# Patient Record
Sex: Male | Born: 1945 | Race: White | Hispanic: No | Marital: Married | Smoking: Former smoker
Health system: Southern US, Community
[De-identification: ages and names within clinical notes are randomized; demographics above are authoritative.]

## PROBLEM LIST (undated history)

## (undated) DIAGNOSIS — K635 Polyp of colon: Secondary | ICD-10-CM

## (undated) DIAGNOSIS — H269 Unspecified cataract: Secondary | ICD-10-CM

## (undated) DIAGNOSIS — E785 Hyperlipidemia, unspecified: Secondary | ICD-10-CM

## (undated) DIAGNOSIS — J449 Chronic obstructive pulmonary disease, unspecified: Secondary | ICD-10-CM

## (undated) DIAGNOSIS — Z5189 Encounter for other specified aftercare: Secondary | ICD-10-CM

## (undated) HISTORY — DX: Hyperlipidemia, unspecified: E78.5

## (undated) HISTORY — DX: Encounter for other specified aftercare: Z51.89

## (undated) HISTORY — DX: Unspecified cataract: H26.9

## (undated) HISTORY — PX: COLONOSCOPY: SHX174

## (undated) HISTORY — PX: TONSILLECTOMY: SUR1361

## (undated) HISTORY — PX: APPENDECTOMY: SHX54

## (undated) HISTORY — DX: Polyp of colon: K63.5

## (undated) HISTORY — DX: Chronic obstructive pulmonary disease, unspecified: J44.9

---

## 1972-10-05 HISTORY — PX: FRACTURE SURGERY: SHX138

## 2012-02-11 ENCOUNTER — Encounter: Payer: Self-pay | Admitting: Internal Medicine

## 2014-07-02 ENCOUNTER — Encounter: Payer: Self-pay | Admitting: Internal Medicine

## 2016-10-05 HISTORY — PX: LUMBAR SPINE SURGERY: SHX701

## 2016-10-05 HISTORY — PX: SPINE SURGERY: SHX786

## 2016-10-08 DIAGNOSIS — M48061 Spinal stenosis, lumbar region without neurogenic claudication: Secondary | ICD-10-CM | POA: Diagnosis not present

## 2016-10-08 DIAGNOSIS — M5136 Other intervertebral disc degeneration, lumbar region: Secondary | ICD-10-CM | POA: Diagnosis not present

## 2016-10-08 DIAGNOSIS — M4186 Other forms of scoliosis, lumbar region: Secondary | ICD-10-CM | POA: Diagnosis not present

## 2016-10-08 DIAGNOSIS — M9953 Intervertebral disc stenosis of neural canal of lumbar region: Secondary | ICD-10-CM | POA: Diagnosis not present

## 2016-10-13 DIAGNOSIS — M5136 Other intervertebral disc degeneration, lumbar region: Secondary | ICD-10-CM | POA: Diagnosis not present

## 2016-10-13 DIAGNOSIS — M5440 Lumbago with sciatica, unspecified side: Secondary | ICD-10-CM | POA: Diagnosis not present

## 2016-10-13 DIAGNOSIS — M4156 Other secondary scoliosis, lumbar region: Secondary | ICD-10-CM | POA: Diagnosis not present

## 2016-10-13 DIAGNOSIS — M5126 Other intervertebral disc displacement, lumbar region: Secondary | ICD-10-CM | POA: Diagnosis not present

## 2016-10-14 DIAGNOSIS — M5136 Other intervertebral disc degeneration, lumbar region: Secondary | ICD-10-CM | POA: Diagnosis not present

## 2016-10-14 DIAGNOSIS — Z720 Tobacco use: Secondary | ICD-10-CM | POA: Diagnosis not present

## 2016-10-14 DIAGNOSIS — I4510 Unspecified right bundle-branch block: Secondary | ICD-10-CM | POA: Diagnosis not present

## 2016-10-14 DIAGNOSIS — G4700 Insomnia, unspecified: Secondary | ICD-10-CM | POA: Diagnosis not present

## 2016-10-14 DIAGNOSIS — Z01818 Encounter for other preprocedural examination: Secondary | ICD-10-CM | POA: Diagnosis not present

## 2016-10-14 DIAGNOSIS — J449 Chronic obstructive pulmonary disease, unspecified: Secondary | ICD-10-CM | POA: Diagnosis not present

## 2016-10-19 DIAGNOSIS — L989 Disorder of the skin and subcutaneous tissue, unspecified: Secondary | ICD-10-CM | POA: Diagnosis not present

## 2016-10-20 DIAGNOSIS — E785 Hyperlipidemia, unspecified: Secondary | ICD-10-CM | POA: Diagnosis not present

## 2016-10-20 DIAGNOSIS — G4730 Sleep apnea, unspecified: Secondary | ICD-10-CM | POA: Diagnosis not present

## 2016-10-20 DIAGNOSIS — R6882 Decreased libido: Secondary | ICD-10-CM | POA: Diagnosis not present

## 2016-10-20 DIAGNOSIS — M5136 Other intervertebral disc degeneration, lumbar region: Secondary | ICD-10-CM | POA: Diagnosis not present

## 2016-10-20 DIAGNOSIS — F17210 Nicotine dependence, cigarettes, uncomplicated: Secondary | ICD-10-CM | POA: Diagnosis not present

## 2016-10-20 DIAGNOSIS — M4606 Spinal enthesopathy, lumbar region: Secondary | ICD-10-CM | POA: Diagnosis not present

## 2016-10-20 DIAGNOSIS — L989 Disorder of the skin and subcutaneous tissue, unspecified: Secondary | ICD-10-CM | POA: Diagnosis not present

## 2016-10-20 DIAGNOSIS — M4726 Other spondylosis with radiculopathy, lumbar region: Secondary | ICD-10-CM | POA: Diagnosis not present

## 2016-10-20 DIAGNOSIS — G4700 Insomnia, unspecified: Secondary | ICD-10-CM | POA: Diagnosis not present

## 2016-10-20 DIAGNOSIS — R5383 Other fatigue: Secondary | ICD-10-CM | POA: Diagnosis not present

## 2016-10-20 DIAGNOSIS — M48061 Spinal stenosis, lumbar region without neurogenic claudication: Secondary | ICD-10-CM | POA: Diagnosis not present

## 2016-10-20 DIAGNOSIS — R911 Solitary pulmonary nodule: Secondary | ICD-10-CM | POA: Diagnosis not present

## 2016-10-20 DIAGNOSIS — M2578 Osteophyte, vertebrae: Secondary | ICD-10-CM | POA: Diagnosis not present

## 2016-10-20 DIAGNOSIS — M9933 Osseous stenosis of neural canal of lumbar region: Secondary | ICD-10-CM | POA: Diagnosis not present

## 2016-10-20 DIAGNOSIS — M5116 Intervertebral disc disorders with radiculopathy, lumbar region: Secondary | ICD-10-CM | POA: Diagnosis not present

## 2016-10-20 DIAGNOSIS — M4186 Other forms of scoliosis, lumbar region: Secondary | ICD-10-CM | POA: Diagnosis not present

## 2016-10-20 DIAGNOSIS — I4519 Other right bundle-branch block: Secondary | ICD-10-CM | POA: Diagnosis not present

## 2016-10-20 DIAGNOSIS — J449 Chronic obstructive pulmonary disease, unspecified: Secondary | ICD-10-CM | POA: Diagnosis not present

## 2016-10-20 DIAGNOSIS — M48062 Spinal stenosis, lumbar region with neurogenic claudication: Secondary | ICD-10-CM | POA: Diagnosis not present

## 2016-10-20 DIAGNOSIS — Z86010 Personal history of colonic polyps: Secondary | ICD-10-CM | POA: Diagnosis not present

## 2016-10-20 DIAGNOSIS — M5117 Intervertebral disc disorders with radiculopathy, lumbosacral region: Secondary | ICD-10-CM | POA: Diagnosis not present

## 2016-10-21 DIAGNOSIS — M5116 Intervertebral disc disorders with radiculopathy, lumbar region: Secondary | ICD-10-CM | POA: Diagnosis not present

## 2016-10-21 DIAGNOSIS — M48061 Spinal stenosis, lumbar region without neurogenic claudication: Secondary | ICD-10-CM | POA: Diagnosis not present

## 2016-10-21 DIAGNOSIS — M4726 Other spondylosis with radiculopathy, lumbar region: Secondary | ICD-10-CM | POA: Diagnosis not present

## 2016-10-21 DIAGNOSIS — M4606 Spinal enthesopathy, lumbar region: Secondary | ICD-10-CM | POA: Diagnosis not present

## 2016-10-21 DIAGNOSIS — M4186 Other forms of scoliosis, lumbar region: Secondary | ICD-10-CM | POA: Diagnosis not present

## 2016-10-21 DIAGNOSIS — M5117 Intervertebral disc disorders with radiculopathy, lumbosacral region: Secondary | ICD-10-CM | POA: Diagnosis not present

## 2016-11-19 DIAGNOSIS — M545 Low back pain: Secondary | ICD-10-CM | POA: Diagnosis not present

## 2016-11-19 DIAGNOSIS — M5116 Intervertebral disc disorders with radiculopathy, lumbar region: Secondary | ICD-10-CM | POA: Diagnosis not present

## 2016-12-01 DIAGNOSIS — M5116 Intervertebral disc disorders with radiculopathy, lumbar region: Secondary | ICD-10-CM | POA: Diagnosis not present

## 2016-12-01 DIAGNOSIS — M545 Low back pain: Secondary | ICD-10-CM | POA: Diagnosis not present

## 2016-12-02 DIAGNOSIS — T8189XA Other complications of procedures, not elsewhere classified, initial encounter: Secondary | ICD-10-CM | POA: Diagnosis not present

## 2016-12-02 DIAGNOSIS — B009 Herpesviral infection, unspecified: Secondary | ICD-10-CM | POA: Diagnosis not present

## 2016-12-02 DIAGNOSIS — M5442 Lumbago with sciatica, left side: Secondary | ICD-10-CM | POA: Diagnosis not present

## 2016-12-02 DIAGNOSIS — G8929 Other chronic pain: Secondary | ICD-10-CM | POA: Diagnosis not present

## 2016-12-03 DIAGNOSIS — M5116 Intervertebral disc disorders with radiculopathy, lumbar region: Secondary | ICD-10-CM | POA: Diagnosis not present

## 2016-12-03 DIAGNOSIS — M545 Low back pain: Secondary | ICD-10-CM | POA: Diagnosis not present

## 2017-02-08 DIAGNOSIS — D2261 Melanocytic nevi of right upper limb, including shoulder: Secondary | ICD-10-CM | POA: Diagnosis not present

## 2017-02-08 DIAGNOSIS — L821 Other seborrheic keratosis: Secondary | ICD-10-CM | POA: Diagnosis not present

## 2017-02-08 DIAGNOSIS — L570 Actinic keratosis: Secondary | ICD-10-CM | POA: Diagnosis not present

## 2017-02-08 DIAGNOSIS — D225 Melanocytic nevi of trunk: Secondary | ICD-10-CM | POA: Diagnosis not present

## 2017-02-08 DIAGNOSIS — Z7189 Other specified counseling: Secondary | ICD-10-CM | POA: Diagnosis not present

## 2017-02-08 DIAGNOSIS — L853 Xerosis cutis: Secondary | ICD-10-CM | POA: Diagnosis not present

## 2017-02-19 DIAGNOSIS — Z1211 Encounter for screening for malignant neoplasm of colon: Secondary | ICD-10-CM | POA: Diagnosis not present

## 2017-02-19 DIAGNOSIS — D123 Benign neoplasm of transverse colon: Secondary | ICD-10-CM | POA: Diagnosis not present

## 2017-02-19 DIAGNOSIS — D122 Benign neoplasm of ascending colon: Secondary | ICD-10-CM | POA: Diagnosis not present

## 2017-02-19 DIAGNOSIS — Z86010 Personal history of colonic polyps: Secondary | ICD-10-CM | POA: Diagnosis not present

## 2017-02-19 DIAGNOSIS — D128 Benign neoplasm of rectum: Secondary | ICD-10-CM | POA: Diagnosis not present

## 2017-07-15 DIAGNOSIS — Z23 Encounter for immunization: Secondary | ICD-10-CM | POA: Diagnosis not present

## 2018-01-10 ENCOUNTER — Ambulatory Visit (INDEPENDENT_AMBULATORY_CARE_PROVIDER_SITE_OTHER): Payer: Medicare Other | Admitting: Family Medicine

## 2018-01-10 ENCOUNTER — Encounter: Payer: Self-pay | Admitting: Family Medicine

## 2018-01-10 VITALS — BP 122/78 | HR 72 | Ht 69.5 in | Wt 162.4 lb

## 2018-01-10 DIAGNOSIS — Z87891 Personal history of nicotine dependence: Secondary | ICD-10-CM | POA: Diagnosis not present

## 2018-01-10 DIAGNOSIS — G8929 Other chronic pain: Secondary | ICD-10-CM | POA: Diagnosis not present

## 2018-01-10 DIAGNOSIS — F10982 Alcohol use, unspecified with alcohol-induced sleep disorder: Secondary | ICD-10-CM | POA: Diagnosis not present

## 2018-01-10 DIAGNOSIS — R002 Palpitations: Secondary | ICD-10-CM

## 2018-01-10 DIAGNOSIS — M545 Low back pain, unspecified: Secondary | ICD-10-CM | POA: Insufficient documentation

## 2018-01-10 DIAGNOSIS — Z789 Other specified health status: Secondary | ICD-10-CM | POA: Diagnosis not present

## 2018-01-10 DIAGNOSIS — R200 Anesthesia of skin: Secondary | ICD-10-CM | POA: Insufficient documentation

## 2018-01-10 DIAGNOSIS — R0789 Other chest pain: Secondary | ICD-10-CM | POA: Insufficient documentation

## 2018-01-10 DIAGNOSIS — Z7289 Other problems related to lifestyle: Secondary | ICD-10-CM

## 2018-01-10 DIAGNOSIS — Z125 Encounter for screening for malignant neoplasm of prostate: Secondary | ICD-10-CM | POA: Diagnosis not present

## 2018-01-10 DIAGNOSIS — Z9889 Other specified postprocedural states: Secondary | ICD-10-CM | POA: Diagnosis not present

## 2018-01-10 DIAGNOSIS — Z122 Encounter for screening for malignant neoplasm of respiratory organs: Secondary | ICD-10-CM | POA: Insufficient documentation

## 2018-01-10 NOTE — Progress Notes (Addendum)
Subjective:  Patient ID: Kevin Shepard, male    DOB: 1946-07-20  Age: 72 y.o. MRN: [id number]  CC: Establish Care   HPI Kevin Shepard presents for establishment of care.  Recently moved to this area from [facility] area.  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  He is recently married.  His wife is from [place].  He presents with several medical issues.  He has ongoing intermittent numbness and tingling in his left first 3 fingers.  His right hand is affected but not to the extent extent of his left.  He spends a lot of time typing on the keyboard with his work.  He has lower back pain that sometimes brings him to his knees.  He had lower back surgery a year ago with the excision excision of multiple bone spurs and believes that a laminectomy was also involved with the surgery.  His back is done much better since the surgery but continues to bother him somewhat.  He denies numbness or tingling in his lower extremities.  There is been no weakness.  He does feel itching on the MTPs of his feet.  There is no rash.  He quit smoking last year.  He had smoked since his early 49s.  He tells me that he has had CTs of his chest.  He describes nighttime heavy palpitations with left anterior chest pain.  [facility]  He admits that he sometimes has the symptoms with exercise.  Chart also indicates a history of hyperlipidemia.  He has ongoing issues with insomnia.  He has been taking high-dose lunesta in the past that he purchases over-the-counter in [place].  He is also taking Unisom.  He does not use illicit drugs.  He drinks 3-4 bottles of wine weekly.  History Kevin Shepard has a past medical history of Hyperlipidemia.   He has a past surgical history that includes Appendectomy and Tonsillectomy.   His family history includes Cancer in his mother; Early death in his father; Heart disease in his father; Hyperlipidemia in  his sister.He reports that he has quit smoking. He has never used smokeless tobacco. He reports that he drinks alcohol. He reports that he does not use drugs.  Outpatient Medications Prior to Visit  Medication Sig Dispense Refill  . acyclovir (ZOVIRAX) 400 MG tablet Take 400 mg by mouth 2 (two) times daily.     No facility-administered medications prior to visit.     ROS Review of Systems  Constitutional: Positive for fatigue. Negative for chills, fever and unexpected weight change.  HENT: Negative.   Eyes: Negative.   Respiratory: Positive for shortness of breath. Negative for chest tightness and wheezing.   Cardiovascular: Positive for chest pain and palpitations.  Gastrointestinal: Negative.   Genitourinary: Negative.   Musculoskeletal: Positive for back pain. Negative for myalgias.  Skin: Negative for pallor and rash.  Allergic/Immunologic: Negative for immunocompromised state.  Neurological: Negative for weakness and numbness.  Hematological: Does not bruise/bleed easily.  Psychiatric/Behavioral: Negative.     Objective:  BP 122/78 (BP Location: Right Arm, Patient Position: Sitting, Cuff Size: Normal)   Pulse 72   Ht 5' 9.5" (1.765 m)   Wt 162 lb 6 oz (73.7 kg)   SpO2 95%   BMI 23.63 kg/m   Physical Exam  Constitutional: He appears well-developed and well-nourished. No distress.  HENT:  Head: Normocephalic and atraumatic.  Right Ear: External ear normal.  Left Ear: External ear normal.  Nose: Nose normal.  Mouth/Throat: Oropharynx is clear and moist. No oropharyngeal exudate.  Eyes: Pupils are equal, round, and reactive to light. Conjunctivae are normal. Right eye exhibits no discharge. Left eye exhibits no discharge. No scleral icterus.  Neck: Normal range of motion. Neck supple. No JVD present. No tracheal deviation present. No thyromegaly present.  Cardiovascular: Normal rate, regular rhythm and normal heart sounds.  Pulmonary/Chest: Effort normal and breath  sounds normal. No stridor. No respiratory distress. He has no wheezes. He has no rales.  Abdominal: Soft. Bowel sounds are normal. He exhibits distension. There is no tenderness. There is no guarding.  Musculoskeletal:       Lumbar back: He exhibits normal range of motion, no tenderness and no bony tenderness.  Lymphadenopathy:    He has no cervical adenopathy.  Neurological: He has normal strength.  Skin: Skin is warm and dry.      Assessment & Plan:   Shaw was seen today for establish care.  Diagnoses and all orders for this visit:  Other chest pain -     CBC; Future -     Comprehensive metabolic panel; Future -     Lipid panel; Future -     TSH; Future -     Urinalysis, Routine w reflex microscopic; Future -     Ambulatory referral to Cardiology  Palpitation -     TSH; Future -     Ambulatory referral to Cardiology  Numbness of fingers of both hands -     CBC; Future -     Ambulatory referral to Sports Medicine  Alcohol use  Alcohol-induced insomnia (HCC)  Chronic midline low back pain, with sciatica presence unspecified -     MR Lumbar Spine Wo Contrast; Future -     ALPRAZolam (XANAX) 0.5 MG tablet; Take one hour prior to procedure.  History of tobacco use  Screening for prostate cancer -     PSA; Future  History of back surgery -     Ambulatory referral to Cardiology   I am having Kevin Shepard "Kevin Shepard" start on ALPRAZolam. I am also having him maintain his acyclovir.  Meds ordered this encounter  Medications  . ALPRAZolam (XANAX) 0.5 MG tablet    Sig: Take one hour prior to procedure.    Dispense:  1 tablet    Refill:  0   Able to obtain an EKG  today because her machine is not working.  Patient certainly has risk factors for heart disease.  He will return fasting in the morning for blood work.  His symptoms warrant referral for a stress test.  I do suspect that this patient's insomnia, palpitations and his alcohol use could be anginal related  And I  told him so.  Told him of my concern about the safety of taking Lunesta with any alcohol at all.  He understood.  Follow-up: Return in about 1 month (around 02/07/2018).  Libby Maw, MD

## 2018-01-10 NOTE — Patient Instructions (Signed)
Back Pain, Adult Back pain is very common. The pain often gets better over time. The cause of back pain is usually not dangerous. Most people can learn to manage their back pain on their own. Follow these instructions at home: Watch your back pain for any changes. The following actions may help to lessen any pain you are feeling:  Stay active. Start with short walks on flat ground if you can. Try to walk farther each day.  Exercise regularly as told by your doctor. Exercise helps your back heal faster. It also helps avoid future injury by keeping your muscles strong and flexible.  Do not sit, drive, or stand in one place for more than 30 minutes.  Do not stay in bed. Resting more than 1-2 days can slow down your recovery.  Be careful when you bend or lift an object. Use good form when lifting: ? Bend at your knees. ? Keep the object close to your body. ? Do not twist.  Sleep on a firm mattress. Lie on your side, and bend your knees. If you lie on your back, put a pillow under your knees.  Take medicines only as told by your doctor.  Put ice on the injured area. ? Put ice in a plastic bag. ? Place a towel between your skin and the bag. ? Leave the ice on for 20 minutes, 2-3 times a day for the first 2-3 days. After that, you can switch between ice and heat packs.  Avoid feeling anxious or stressed. Find good ways to deal with stress, such as exercise.  Maintain a healthy weight. Extra weight puts stress on your back.  Contact a doctor if:  You have pain that does not go away with rest or medicine.  You have worsening pain that goes down into your legs or buttocks.  You have pain that does not get better in one week.  You have pain at night.  You lose weight.  You have a fever or chills. Get help right away if:  You cannot control when you poop (bowel movement) or pee (urinate).  Your arms or legs feel weak.  Your arms or legs lose feeling (numbness).  You feel sick  to your stomach (nauseous) or throw up (vomit).  You have belly (abdominal) pain.  You feel like you may pass out (faint). This information is not intended to replace advice given to you by your health care provider. Make sure you discuss any questions you have with your health care provider. Document Released: 03/09/2008 Document Revised: 02/27/2016 Document Reviewed: 01/23/2014 Elsevier Interactive Patient Education  2018 [employer].  Chronic Back Pain When back pain lasts longer than 3 months, it is called chronic back pain.The cause of your back pain may not be known. Some common causes include:  Wear and tear (degenerative disease) of the bones, ligaments, or disks in your back.  Inflammation and stiffness in your back (arthritis).  People who have chronic back pain often go through certain periods in which the pain is more intense (flare-ups). Many people can learn to manage the pain with home care. Follow these instructions at home: Pay attention to any changes in your symptoms. Take these actions to help with your pain: Activity  Avoid bending and activities that make the problem worse.  Do not sit or stand in one place for long periods of time.  Take brief periods of rest throughout the day. This will reduce your pain. Resting in a lying or standing  position is usually better than sitting to rest.  When you are resting for longer periods, mix in some mild activity or stretching between periods of rest. This will help to prevent stiffness and pain.  Get regular exercise. Ask your health care provider what activities are safe for you.  Do not lift anything that is heavier than 10 lb (4.5 kg). Always use proper lifting technique, which includes: ? Bending your knees. ? Keeping the load close to your body. ? Avoiding twisting. Managing pain  If directed, apply ice to the painful area. Your health care provider may recommend applying ice during the first 24-48 hours after  a flare-up begins. ? Put ice in a plastic bag. ? Place a towel between your skin and the bag. ? Leave the ice on for 20 minutes, 2-3 times per day.  After icing, apply heat to the affected area as often as told by your health care provider. Use the heat source that your health care provider recommends, such as a moist heat pack or a heating pad. ? Place a towel between your skin and the heat source. ? Leave the heat on for 20-30 minutes. ? Remove the heat if your skin turns bright red. This is especially important if you are unable to feel pain, heat, or cold. You may have a greater risk of getting burned.  Try soaking in a warm tub.  Take over-the-counter and prescription medicines only as told by your health care provider.  Keep all follow-up visits as told by your health care provider. This is important. Contact a health care provider if:  You have pain that is not relieved with rest or medicine. Get help right away if:  You have weakness or numbness in one or both of your legs or feet.  You have trouble controlling your bladder or your bowels.  You have nausea or vomiting.  You have pain in your abdomen.  You have shortness of breath or you faint. This information is not intended to replace advice given to you by your health care provider. Make sure you discuss any questions you have with your health care provider. Document Released: 10/29/2004 Document Revised: 01/30/2016 Document Reviewed: 03/11/2015 Elsevier Interactive Patient Education  2018 [employer].  Exercise Stress Electrocardiogram An exercise stress electrocardiogram is a test that is done to evaluate the blood supply to your heart. This test may also be called exercise stress electrocardiography. The test is done while you are walking on a treadmill. The goal of this test is to raise your heart rate. This test is done to find areas of poor blood flow to the heart by determining the extent of coronary artery  disease (CAD). CAD is defined as narrowing in one or more heart (coronary) arteries of more than 70%. If you have an abnormal test result, this may mean that you are not getting adequate blood flow to your heart during exercise. Additional testing may be needed to understand why your test was abnormal. Tell a health care provider about:  Any allergies you have.  All medicines you are taking, including vitamins, herbs, eye drops, creams, and over-the-counter medicines.  Any problems you or family members have had with anesthetic medicines.  Any blood disorders you have.  Any surgeries you have had.  Any medical conditions you have.  Possibility of pregnancy, if this applies. What are the risks? Generally, this is a safe procedure. However, as with any procedure, complications can occur. Possible complications can include:  Pain  or pressure in the following areas: ? Chest. ? Jaw or neck. ? Between your shoulder blades. ? Radiating down your left arm.  Dizziness or light-headedness.  Shortness of breath.  Increased or irregular heartbeats.  Nausea or vomiting.  Heart attack (rare).  What happens before the procedure?  Avoid all forms of caffeine 24 hours before your test or as directed by your health care provider. This includes coffee, tea (even decaffeinated tea), caffeinated sodas, chocolate, cocoa, and certain pain medicines.  Follow your health care provider's instructions regarding eating and drinking before the test.  Take your medicines as directed at regular times with water unless instructed otherwise. Exceptions may include: ? If you have diabetes, ask how you are to take your insulin or pills. It is common to adjust insulin dosing the morning of the test. ? If you are taking beta-blocker medicines, it is important to talk to your health care provider about these medicines well before the date of your test. Taking beta-blocker medicines may interfere with the test.  In some cases, these medicines need to be changed or stopped 24 hours or more before the test. ? If you wear a nitroglycerin patch, it may need to be removed prior to the test. Ask your health care provider if the patch should be removed before the test.  If you use an inhaler for any breathing condition, bring it with you to the test.  If you are an outpatient, bring a snack so you can eat right after the stress phase of the test.  Do not smoke for 4 hours prior to the test or as directed by your health care provider.  Do not apply lotions, powders, creams, or oils on your chest prior to the test.  Wear loose-fitting clothes and comfortable shoes for the test. This test involves walking on a treadmill. What happens during the procedure?  Multiple patches (electrodes) will be put on your chest. If needed, small areas of your chest may have to be shaved to get better contact with the electrodes. Once the electrodes are attached to your body, multiple wires will be attached to the electrodes and your heart rate will be monitored.  Your heart will be monitored both at rest and while exercising.  You will walk on a treadmill. The treadmill will be started at a slow pace. The treadmill speed and incline will gradually be increased to raise your heart rate. What happens after the procedure?  Your heart rate and blood pressure will be monitored after the test.  You may return to your normal schedule including diet, activities, and medicines, unless your health care provider tells you otherwise. This information is not intended to replace advice given to you by your health care provider. Make sure you discuss any questions you have with your health care provider. Document Released: 09/18/2000 Document Revised: 02/27/2016 Document Reviewed: 05/29/2013 Elsevier Interactive Patient Education  2017 [employer].  Palpitations A palpitation is the feeling that your heartbeat is irregular or is  faster than normal. It may feel like your heart is fluttering or skipping a beat. Palpitations are usually not a serious problem. They may be caused by many things, including smoking, caffeine, alcohol, stress, and certain medicines. Although most causes of palpitations are not serious, palpitations can be a sign of a serious medical problem. In some cases, you may need further medical evaluation. Follow these instructions at home: Pay attention to any changes in your symptoms. Take these actions to help with your  condition:  Avoid the following: ? Caffeinated coffee, tea, soft drinks, diet pills, and energy drinks. ? Chocolate. ? Alcohol.  Do not use any tobacco products, such as cigarettes, chewing tobacco, and e-cigarettes. If you need help quitting, ask your health care provider.  Try to reduce your stress and anxiety. Things that can help you relax include: ? Yoga. ? Meditation. ? Physical activity, such as swimming, jogging, or walking. ? Biofeedback. This is a method that helps you learn to use your mind to control things in your body, such as your heartbeats.  Get plenty of rest and sleep.  Take over-the-counter and prescription medicines only as told by your health care provider.  Keep all follow-up visits as told by your health care provider. This is important.  Contact a health care provider if:  You continue to have a fast or irregular heartbeat after 24 hours.  Your palpitations occur more often. Get help right away if:  You have chest pain or shortness of breath.  You have a severe headache.  You feel dizzy or you faint. This information is not intended to replace advice given to you by your health care provider. Make sure you discuss any questions you have with your health care provider. Document Released: 09/18/2000 Document Revised: 02/24/2016 Document Reviewed: 06/06/2015 Elsevier Interactive Patient Education  2018 [employer]. Insomnia Insomnia is a sleep  disorder that makes it difficult to fall asleep or to stay asleep. Insomnia can cause tiredness (fatigue), low energy, difficulty concentrating, mood swings, and poor performance at work or school. There are three different ways to classify insomnia:  Difficulty falling asleep.  Difficulty staying asleep.  Waking up too early in the morning.  Any type of insomnia can be long-term (chronic) or short-term (acute). Both are common. Short-term insomnia usually lasts for three months or less. Chronic insomnia occurs at least three times a week for longer than three months. What are the causes? Insomnia may be caused by another condition, situation, or substance, such as:  Anxiety.  Certain medicines.  Gastroesophageal reflux disease (GERD) or other gastrointestinal conditions.  Asthma or other breathing conditions.  Restless legs syndrome, sleep apnea, or other sleep disorders.  Chronic pain.  Menopause. This may include hot flashes.  Stroke.  Abuse of alcohol, tobacco, or illegal drugs.  Depression.  Caffeine.  Neurological disorders, such as Alzheimer disease.  An overactive thyroid (hyperthyroidism).  The cause of insomnia may not be known. What increases the risk? Risk factors for insomnia include:  Gender. Women are more commonly affected than men.  Age. Insomnia is more common as you get older.  Stress. This may involve your professional or personal life.  Income. Insomnia is more common in people with lower income.  Lack of exercise.  Irregular work schedule or night shifts.  Traveling between different time zones.  What are the signs or symptoms? If you have insomnia, trouble falling asleep or trouble staying asleep is the main symptom. This may lead to other symptoms, such as:  Feeling fatigued.  Feeling nervous about going to sleep.  Not feeling rested in the morning.  Having trouble concentrating.  Feeling irritable, anxious, or  depressed.  How is this treated? Treatment for insomnia depends on the cause. If your insomnia is caused by an underlying condition, treatment will focus on addressing the condition. Treatment may also include:  Medicines to help you sleep.  Counseling or therapy.  Lifestyle adjustments.  Follow these instructions at home:  Take medicines only as  directed by your health care provider.  Keep regular sleeping and waking hours. Avoid naps.  Keep a sleep diary to help you and your health care provider figure out what could be causing your insomnia. Include: ? When you sleep. ? When you wake up during the night. ? How well you sleep. ? How rested you feel the next day. ? Any side effects of medicines you are taking. ? What you eat and drink.  Make your bedroom a comfortable place where it is easy to fall asleep: ? Put up shades or special blackout curtains to block light from outside. ? Use a white noise machine to block noise. ? Keep the temperature cool.  Exercise regularly as directed by your health care provider. Avoid exercising right before bedtime.  Use relaxation techniques to manage stress. Ask your health care provider to suggest some techniques that may work well for you. These may include: ? Breathing exercises. ? Routines to release muscle tension. ? Visualizing peaceful scenes.  Cut back on alcohol, caffeinated beverages, and cigarettes, especially close to bedtime. These can disrupt your sleep.  Do not overeat or eat spicy foods right before bedtime. This can lead to digestive discomfort that can make it hard for you to sleep.  Limit screen use before bedtime. This includes: ? Watching TV. ? Using your smartphone, tablet, and computer.  Stick to a routine. This can help you fall asleep faster. Try to do a quiet activity, brush your teeth, and go to bed at the same time each night.  Get out of bed if you are still awake after 15 minutes of trying to sleep. Keep  the lights down, but try reading or doing a quiet activity. When you feel sleepy, go back to bed.  Make sure that you drive carefully. Avoid driving if you feel very sleepy.  Keep all follow-up appointments as directed by your health care provider. This is important. Contact a health care provider if:  You are tired throughout the day or have trouble in your daily routine due to sleepiness.  You continue to have sleep problems or your sleep problems get worse. Get help right away if:  You have serious thoughts about hurting yourself or someone else. This information is not intended to replace advice given to you by your health care provider. Make sure you discuss any questions you have with your health care provider. Document Released: 09/18/2000 Document Revised: 02/21/2016 Document Reviewed: 06/22/2014 Elsevier Interactive Patient Education  [employer].

## 2018-01-11 ENCOUNTER — Other Ambulatory Visit (INDEPENDENT_AMBULATORY_CARE_PROVIDER_SITE_OTHER): Payer: Medicare Other

## 2018-01-11 ENCOUNTER — Ambulatory Visit: Payer: PRIVATE HEALTH INSURANCE | Admitting: Family Medicine

## 2018-01-11 ENCOUNTER — Telehealth: Payer: Self-pay | Admitting: Family Medicine

## 2018-01-11 DIAGNOSIS — R002 Palpitations: Secondary | ICD-10-CM

## 2018-01-11 DIAGNOSIS — R200 Anesthesia of skin: Secondary | ICD-10-CM

## 2018-01-11 DIAGNOSIS — Z125 Encounter for screening for malignant neoplasm of prostate: Secondary | ICD-10-CM | POA: Diagnosis not present

## 2018-01-11 DIAGNOSIS — R0789 Other chest pain: Secondary | ICD-10-CM | POA: Diagnosis not present

## 2018-01-11 LAB — URINALYSIS, ROUTINE W REFLEX MICROSCOPIC
Bilirubin Urine: NEGATIVE
Hgb urine dipstick: NEGATIVE
Ketones, ur: NEGATIVE
Leukocytes, UA: NEGATIVE
Nitrite: NEGATIVE
RBC / HPF: NONE SEEN (ref 0–?)
Specific Gravity, Urine: 1.01 (ref 1.000–1.030)
Total Protein, Urine: NEGATIVE
Urine Glucose: NEGATIVE
Urobilinogen, UA: 0.2 (ref 0.0–1.0)
pH: 6.5 (ref 5.0–8.0)

## 2018-01-11 LAB — COMPREHENSIVE METABOLIC PANEL
ALT: 14 U/L (ref 0–53)
AST: 12 U/L (ref 0–37)
Albumin: 4.9 g/dL (ref 3.5–5.2)
Alkaline Phosphatase: 59 U/L (ref 39–117)
BUN: 16 mg/dL (ref 6–23)
CO2: 30 mEq/L (ref 19–32)
Calcium: 9.9 mg/dL (ref 8.4–10.5)
Chloride: 102 mEq/L (ref 96–112)
Creatinine, Ser: 0.79 mg/dL (ref 0.40–1.50)
GFR: 102.67 mL/min (ref >60.00)
Glucose, Bld: 85 mg/dL (ref 70–99)
Potassium: 4.1 mEq/L (ref 3.5–5.1)
Sodium: 142 mEq/L (ref 135–145)
Total Bilirubin: 1.2 mg/dL (ref 0.2–1.2)
Total Protein: 7.5 g/dL (ref 6.0–8.3)

## 2018-01-11 LAB — LIPID PANEL
Cholesterol: 180 mg/dL (ref 0–200)
HDL: 38 mg/dL — ABNORMAL LOW (ref >39.00)
LDL Cholesterol: 124 mg/dL — ABNORMAL HIGH (ref 0–99)
NonHDL: 141.66
Total CHOL/HDL Ratio: 5
Triglycerides: 88 mg/dL (ref 0.0–149.0)
VLDL: 17.6 mg/dL (ref 0.0–40.0)

## 2018-01-11 LAB — CBC
HCT: 44.6 % (ref 39.0–52.0)
Hemoglobin: 14.9 g/dL (ref 13.0–17.0)
MCHC: 33.5 g/dL (ref 30.0–36.0)
MCV: 84.4 fl (ref 78.0–100.0)
Platelets: 249 10*3/uL (ref 150.0–400.0)
RBC: 5.28 Mil/uL (ref 4.22–5.81)
RDW: 13.2 % (ref 11.5–15.5)
WBC: 5.4 10*3/uL (ref 4.0–10.5)

## 2018-01-11 LAB — PSA: PSA: 0.64 ng/mL (ref 0.10–4.00)

## 2018-01-11 LAB — TSH: TSH: 1.72 u[IU]/mL (ref 0.35–4.50)

## 2018-01-11 MED ORDER — ALPRAZOLAM 0.5 MG PO TABS
ORAL_TABLET | ORAL | 0 refills | Status: DC
Start: 2018-01-11 — End: 2018-01-13

## 2018-01-11 NOTE — Telephone Encounter (Signed)
I left a voicemail for patient to call the office back. I put directions in lab result note for triage to go over with patient if he returns the call.

## 2018-01-11 NOTE — Telephone Encounter (Signed)
Have sent an rx for a xanax to be taken one hour prior to the procedure.

## 2018-01-11 NOTE — Telephone Encounter (Signed)
Copied from [place] [id number]. Topic: Quick Communication - See Telephone Encounter >> Jan 11, 2018 11:30 AM Neva Seat wrote: Pt has an MRI - Monday 15th.  He is asking if he can get something prescribed for him to calm him during ht procedure. Please call pt back to discuss.

## 2018-01-11 NOTE — Addendum Note (Signed)
Addended by: Lynnea Ferrier on: 01/11/2018 09:32 AM   Modules accepted: Orders

## 2018-01-11 NOTE — Addendum Note (Signed)
Addended by: Abelino Derrick A on: 01/11/2018 12:02 PM   Modules accepted: Orders

## 2018-01-11 NOTE — Addendum Note (Signed)
Addended by: Lynnea Ferrier on: 01/11/2018 09:37 AM   Modules accepted: Orders

## 2018-01-12 ENCOUNTER — Encounter: Payer: Self-pay | Admitting: Family Medicine

## 2018-01-13 ENCOUNTER — Encounter: Payer: Self-pay | Admitting: Family Medicine

## 2018-01-13 ENCOUNTER — Ambulatory Visit: Payer: PRIVATE HEALTH INSURANCE | Admitting: Family Medicine

## 2018-01-13 ENCOUNTER — Other Ambulatory Visit (INDEPENDENT_AMBULATORY_CARE_PROVIDER_SITE_OTHER): Payer: Medicare Other

## 2018-01-13 ENCOUNTER — Ambulatory Visit (INDEPENDENT_AMBULATORY_CARE_PROVIDER_SITE_OTHER): Payer: Medicare Other | Admitting: Family Medicine

## 2018-01-13 VITALS — BP 128/64 | HR 62 | Temp 99.2°F | Ht 69.0 in | Wt 159.0 lb

## 2018-01-13 DIAGNOSIS — R200 Anesthesia of skin: Secondary | ICD-10-CM

## 2018-01-13 LAB — VITAMIN B12: Vitamin B-12: 586 pg/mL (ref 211–911)

## 2018-01-13 LAB — FOLATE: Folate: 17.2 ng/mL (ref >5.9)

## 2018-01-13 NOTE — Assessment & Plan Note (Signed)
Doesn't appear to be carpal tunnel. Possible for polyneuropathy. Normal Hgb and TSH. Could be associated with alcohol  - B12 and folate  - discussed nerve conduction test but he wished to refer at this point  - f/u PRN.

## 2018-01-13 NOTE — Progress Notes (Signed)
Kevin Shepard - 72 y.o. male MRN [id number]  Date of birth: 10/27/1945  SUBJECTIVE:  Including CC & ROS.  Chief Complaint  Patient presents with  . Bilateral hand numbness    Kevin Shepard is a 72 y.o. male that is presenting with bilateral hand numbness. Has been chronic in nature, ongoing for several years. Left hand is worse than right. He feels the numbness is intermittent. He has had one instance of not being able to feel the steering wheel. Denies injury. He feels the numbness in the tips of his fingers. Denies any radicular symptoms. No symptoms in his feet. Hasn't tried anything for this before. He denies the symptoms are worse in the mirror.    Review of Systems  Musculoskeletal: Negative for gait problem.  Skin: Negative for color change.  Allergic/Immunologic: Negative for immunocompromised state.  Neurological: Positive for numbness. Negative for weakness.  Hematological: Negative for adenopathy.  Psychiatric/Behavioral: Negative for agitation.    HISTORY: Past Medical, Surgical, Social, and Family History Reviewed & Updated per EMR.   Pertinent Historical Findings include:  Past Medical History:  Diagnosis Date  . Hyperlipidemia     Past Surgical History:  Procedure Laterality Date  . APPENDECTOMY    . LUMBAR SPINE SURGERY  2018  . TONSILLECTOMY      No Known Allergies  Family History  Problem Relation Age of Onset  . Cancer Mother   . Early death Father   . Heart disease Father   . Hyperlipidemia Sister      Social History   Socioeconomic History  . Marital status: Married    Spouse name: Not on file  . Number of children: Not on file  . Years of education: Not on file  . Highest education level: Not on file  Occupational History  . Not on file  Social Needs  . Financial resource strain: Not on file  . Food insecurity:    Worry: Not on file    Inability: Not on file  . Transportation needs:    Medical: Not on file    Non-medical: Not on file    Tobacco Use  . Smoking status: Former Research scientist (life sciences)  . Smokeless tobacco: Never Used  Substance and Sexual Activity  . Alcohol use: Yes  . Drug use: Never  . Sexual activity: Yes    Partners: Female  Lifestyle  . Physical activity:    Days per week: Not on file    Minutes per session: Not on file  . Stress: Not on file  Relationships  . Social connections:    Talks on phone: Not on file    Gets together: Not on file    Attends religious service: Not on file    Active member of club or organization: Not on file    Attends meetings of clubs or organizations: Not on file    Relationship status: Not on file  . Intimate partner violence:    Fear of current or ex partner: Not on file    Emotionally abused: Not on file    Physically abused: Not on file    Forced sexual activity: Not on file  Other Topics Concern  . Not on file  Social History Narrative  . Not on file     PHYSICAL EXAM:  VS: BP 128/64 (BP Location: Left Arm, Patient Position: Sitting, Cuff Size: Normal)   Pulse 62   Temp 99.2 F (37.3 C) (Oral)   Ht 5\' 9"  (1.753 m)   Wt 159  lb (72.1 kg)   SpO2 99%   BMI 23.48 kg/m  Physical Exam Gen: NAD, alert, cooperative with exam, well-appearing ENT: normal lips, normal nasal mucosa,  Eye: normal EOM, normal conjunctiva and lids CV:  no edema, +2 pedal pulses   Resp: no accessory muscle use, non-labored,  Skin: no rashes, no areas of induration  Neuro: normal tone, normal sensation to touch Psych:  normal insight, alert and oriented MSK:  Right and left hand:  No atrophy  Normal thumb opposition  Normal finger adduction and abduction strength to resistance  Normal wrist ROM  Normal elbow ROM  Negative Tinel's test  Neurovascularly intact      ASSESSMENT & PLAN:   Numbness of fingers of both hands Doesn't appear to be carpal tunnel. Possible for polyneuropathy. Normal Hgb and TSH. Could be associated with alcohol  - B12 and folate  - discussed nerve  conduction test but he wished to refer at this point  - f/u PRN.

## 2018-01-13 NOTE — Patient Instructions (Signed)
We will call you with the results from today  If this seems to be occurring more often then I would consider obtaining a nerve conduction study.

## 2018-01-17 ENCOUNTER — Ambulatory Visit
Admission: RE | Admit: 2018-01-17 | Discharge: 2018-01-17 | Disposition: A | Discharge status: Home / Self Care | Payer: Medicare Other | Source: Ambulatory Visit | Attending: Family Medicine | Admitting: Family Medicine

## 2018-01-17 DIAGNOSIS — M545 Low back pain: Principal | ICD-10-CM

## 2018-01-17 DIAGNOSIS — G8929 Other chronic pain: Secondary | ICD-10-CM

## 2018-01-17 DIAGNOSIS — M48061 Spinal stenosis, lumbar region without neurogenic claudication: Secondary | ICD-10-CM | POA: Diagnosis not present

## 2018-02-14 NOTE — Progress Notes (Signed)
Cardiology Office Note   Date:  02/17/2018   ID:  Kevin Shepard, DOB Apr 07, 1946, MRN [id number]  PCP:  Libby Maw, MD  Cardiologist:   Shea Swalley [place], MD   Chief Complaint  Patient presents with  . New Patient (Initial Visit)  . Chest Pain    sharp pain, occcasionally.  . Shortness of Breath    on exertion      History of Present Illness: Kevin Shepard is a 72 y.o. male who is seen at the request of Dr. Ethelene Hal for evaluation of palpitation and chest pain. He has a history of mild HLD. He recently moved from [place] and just wanted a check up. Notes he has a mild left parasternal pain infrequently over the past 2-3 years. It is sharp, localized, and only lasts a couple of minutes. No relation with activity. Also notes his heart seems to pound at night and he feels it in his ears. No skipping or racing. No dizziness.  He quit smoking in 2017/10/17. Father died of heart disease at age 62 but apparently this was related to Rheumatic heart disease. He is very active and exercises almost everyday. Works from home. Eats healthy.  Past Medical History:  Diagnosis Date  . Hyperlipidemia     Past Surgical History:  Procedure Laterality Date  . APPENDECTOMY    . LUMBAR SPINE SURGERY  2018  . TONSILLECTOMY       Current Outpatient Medications  Medication Sig Dispense Refill  . acyclovir (ZOVIRAX) 400 MG tablet Take 400 mg by mouth 2 (two) times daily.     No current facility-administered medications for this visit.     Allergies:   Patient has no known allergies.    Social History:  The patient  reports that he quit smoking about 4 months ago. He quit after 40.00 years of use. He has never used smokeless tobacco. He reports that he drinks alcohol. He reports that he does not use drugs.   Family History:  The patient's family history includes Cancer in his mother; Early death in his father; Heart disease (age of onset: 49) in his father; Hyperlipidemia in his  sister.    ROS:  Please see the history of present illness.   Otherwise, review of systems are positive for none.   All other systems are reviewed and negative.    PHYSICAL EXAM: VS:  BP 120/73   Pulse (!) 59   Ht 5\' 9"  (1.753 m)   Wt 163 lb 3.2 oz (74 kg)   BMI 24.10 kg/m  , BMI Body mass index is 24.1 kg/m. GEN: Well nourished, well developed, in no acute distress  HEENT: normal  Neck: no JVD, carotid bruits, or masses Cardiac: RRR; no murmurs, rubs, or gallops,no edema  Respiratory:  clear to auscultation bilaterally, normal work of breathing GI: soft, nontender, nondistended, + BS MS: no deformity or atrophy  Skin: warm and dry, no rash Neuro:  Strength and sensation are intact Psych: euthymic mood, full affect   EKG:  EKG is ordered today. The ekg ordered today demonstrates NSR with incomplete RBBB. I have personally reviewed and interpreted this study.    Recent Labs: 01/11/2018: ALT 14; BUN 16; Creatinine, Ser 0.79; Hemoglobin 14.9; Platelets 249.0; Potassium 4.1; Sodium 142; TSH 1.72    Lipid Panel    Component Value Date/Time   CHOL 180 01/11/2018 0947   TRIG 88.0 01/11/2018 0947   HDL 38.00 (L) 01/11/2018 0947   CHOLHDL 5  01/11/2018 0947   VLDL 17.6 01/11/2018 0947   LDLCALC 124 (H) 01/11/2018 0947      Wt Readings from Last 3 Encounters:  02/17/18 163 lb 3.2 oz (74 kg)  01/13/18 159 lb (72.1 kg)  01/10/18 162 lb 6 oz (73.7 kg)      Other studies Reviewed: Additional studies/ records that were reviewed today include: none. Review of the above records demonstrates: N/A   ASSESSMENT AND PLAN:  1.  Atypical chest pain. Symptoms are not consistent with angina. Overall risk is low. Ecg is benign and eam is normal. No further testing recommended.  2. Heart pounding. Increased cardiac awareness without real palpitations. Reassured.  3. Mild HLD. Continue lifestyle modification 4. History of tobacco use. Now stopped.    Current medicines are reviewed  at length with the patient today.  The patient does not have concerns regarding medicines.  The following changes have been made:  no change  Labs/ tests ordered today include: none No orders of the defined types were placed in this encounter.    Disposition:   FU with me PRN Signed, Daisy Lites [place], MD  02/17/2018 9:02 AM    [facility] [address] [place], [place], [address] Phone [phone], Fax [phone]

## 2018-02-17 ENCOUNTER — Ambulatory Visit (INDEPENDENT_AMBULATORY_CARE_PROVIDER_SITE_OTHER): Payer: Medicare Other | Admitting: Cardiology

## 2018-02-17 ENCOUNTER — Encounter: Payer: Self-pay | Admitting: Cardiology

## 2018-02-17 VITALS — BP 120/73 | HR 59 | Ht 69.0 in | Wt 163.2 lb

## 2018-02-17 DIAGNOSIS — R002 Palpitations: Secondary | ICD-10-CM | POA: Diagnosis not present

## 2018-02-17 DIAGNOSIS — R0789 Other chest pain: Secondary | ICD-10-CM | POA: Diagnosis not present

## 2018-04-06 ENCOUNTER — Encounter: Payer: Self-pay | Admitting: Emergency Medicine

## 2018-04-06 ENCOUNTER — Ambulatory Visit (INDEPENDENT_AMBULATORY_CARE_PROVIDER_SITE_OTHER): Payer: Medicare Other | Admitting: Emergency Medicine

## 2018-04-06 ENCOUNTER — Other Ambulatory Visit: Payer: Self-pay

## 2018-04-06 VITALS — BP 110/60 | HR 64 | Temp 98.7°F | Resp 16 | Ht 68.5 in | Wt 160.8 lb

## 2018-04-06 DIAGNOSIS — G8929 Other chronic pain: Secondary | ICD-10-CM

## 2018-04-06 DIAGNOSIS — M545 Low back pain, unspecified: Secondary | ICD-10-CM

## 2018-04-06 DIAGNOSIS — K625 Hemorrhage of anus and rectum: Secondary | ICD-10-CM | POA: Diagnosis not present

## 2018-04-06 DIAGNOSIS — Z122 Encounter for screening for malignant neoplasm of respiratory organs: Secondary | ICD-10-CM

## 2018-04-06 DIAGNOSIS — Z87891 Personal history of nicotine dependence: Secondary | ICD-10-CM | POA: Diagnosis not present

## 2018-04-06 LAB — HEMOCCULT GUIAC POC 1CARD (OFFICE): Fecal Occult Blood, POC: POSITIVE — AB

## 2018-04-06 NOTE — Progress Notes (Signed)
Kevin Shepard 72 y.o.   Chief Complaint  Patient presents with  . Establish Care  . Rectal Bleeding    04/05/2018 - per patient on the tissue after wiping    HISTORY OF PRESENT ILLNESS: This is a 72 y.o. male complaining of small amount of rectal bleeding yesterday.  Noticed blood on the tissue paper.  Had some rectal itching.  Denies abdominal pain.  Able to eat and drink.  Denies nausea or vomiting.  Denies fever or chills.  Had colonoscopy done last year with findings of polyps.  Follow-up in 5 years. #2 has history of chronic low back pain.  Recent MRI shows postop changes and some degree of spinal stenosis. #3 former smoker last CT of the chest to 3 years ago.  Has some intermittent fatigue and dyspnea with lightheadedness.  HPI   Prior to Admission medications   Medication Sig Start Date End Date Taking? Authorizing Provider  acyclovir (ZOVIRAX) 400 MG tablet Take 400 mg by mouth 2 (two) times daily.   Yes [provider]    No Known Allergies  Patient Active Problem List   Diagnosis Date Noted  . Other chest pain 01/10/2018  . Palpitation 01/10/2018  . Numbness of fingers of both hands 01/10/2018  . Alcohol use 01/10/2018  . 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 Chronic midline low back pain 01/10/2018  . History of tobacco use 01/10/2018  . Screening for prostate cancer 01/10/2018  . History of back surgery 01/10/2018    Past Medical History:  Diagnosis Date  . COPD (chronic obstructive pulmonary disease) ([place])   . Hyperlipidemia     Past Surgical History:  Procedure Laterality Date  . APPENDECTOMY     per patient 1950's  . CESAREAN SECTION  03/2017  . FRACTURE SURGERY  1974   right clavicle  . LUMBAR SPINE SURGERY  2018  . SPINE SURGERY  10/2016  . TONSILLECTOMY      Social History   Socioeconomic History  . Marital status: Married    Spouse name: Not on file  . Number of children: Not on file  . Years of education: Not on file  .  Highest education level: Not on file  Occupational History  . Occupation: Merchandiser, retail trading  Social Needs  . Financial resource strain: Not on file  . Food insecurity:    Worry: Not on file    Inability: Not on file  . Transportation needs:    Medical: Not on file    Non-medical: Not on file  Tobacco Use  . Smoking status: Former Smoker    Years: 40.00    Last attempt to quit: 10/04/2017    Years since quitting: 0.5  . Smokeless tobacco: Never Used  Substance and Sexual Activity  . Alcohol use: Yes  . Drug use: Never  . Sexual activity: Yes    Partners: Female  Lifestyle  . Physical activity:    Days per week: Not on file    Minutes per session: Not on file  . Stress: Not on file  Relationships  . Social connections:    Talks on phone: Not on file    Gets together: Not on file    Attends religious service: Not on file    Active member of club or organization: Not on file    Attends meetings of clubs or organizations: Not on file    Relationship status: Not on file  . Intimate partner violence:    Fear of current  or ex partner: Not on file    Emotionally abused: Not on file    Physically abused: Not on file    Forced sexual activity: Not on file  Other Topics Concern  . Not on file  Social History Narrative  . Not on file    Family History  Problem Relation Age of Onset  . Cancer Mother   . Early death Father   . Heart disease Father 44       rheumatic heart disease  . Hyperlipidemia Sister      Review of Systems  Constitutional: Positive for malaise/fatigue. Negative for chills and fever.  HENT: Negative.  Negative for sore throat.   Eyes: Negative.  Negative for blurred vision and double vision.  Respiratory: Positive for shortness of breath (Mostly on exertion). Negative for cough, wheezing and stridor.   Cardiovascular: Negative.  Negative for chest pain, palpitations and leg swelling.  Gastrointestinal: Positive for blood in stool and constipation.  Negative for abdominal pain, diarrhea, melena, nausea and vomiting.  Genitourinary: Negative.  Negative for dysuria.  Musculoskeletal: Negative.  Negative for back pain, myalgias and neck pain.  Skin: Negative for rash.  Neurological: Negative.  Negative for dizziness and headaches.  Endo/Heme/Allergies: Negative.  Does not bruise/bleed easily.  All other systems reviewed and are negative.   Vitals:   04/06/18 1401  BP: 110/60  Pulse: 64  Resp: 16  Temp: 98.7 F (37.1 C)  SpO2: 97%    Physical Exam  Constitutional: He is oriented to person, place, and time. He appears well-developed and well-nourished.  HENT:  Head: Normocephalic and atraumatic.  Nose: Nose normal.  Mouth/Throat: Oropharynx is clear and moist.  Eyes: Pupils are equal, round, and reactive to light. Conjunctivae and EOM are normal.  Neck: Normal range of motion. Neck supple. No JVD present. No thyromegaly present.  Cardiovascular: Normal rate, regular rhythm and normal heart sounds.  Pulmonary/Chest: Effort normal and breath sounds normal.  Abdominal: Soft. Bowel sounds are normal. He exhibits no distension. There is no tenderness.  Genitourinary: Rectal exam shows guaiac positive stool. Rectal exam shows no external hemorrhoid, no fissure, no mass, no tenderness and anal tone normal. Prostate is not tender.  Musculoskeletal: Normal range of motion. He exhibits no edema or tenderness.  Lymphadenopathy:    He has no cervical adenopathy.  Neurological: He is alert and oriented to person, place, and time. No sensory deficit. He exhibits normal muscle tone.  Skin: Skin is warm and dry. Capillary refill takes less than 2 seconds. No rash noted.  Psychiatric: He has a normal mood and affect. His behavior is normal.  Vitals reviewed.    ASSESSMENT & PLAN: Kevin Shepard was seen today for establish care and rectal bleeding.  Diagnoses and all orders for this visit:  Rectal bleeding -     CBC with  Differential/Platelet -     Basic metabolic panel -     Hemoccult - 1 Card (office)  Chronic bilateral low back pain without sciatica -     Ambulatory referral to Orthopedic Surgery  Encounter for screening for malignant neoplasm of respiratory organs -     CT CHEST LUNG CA SCREEN LOW DOSE W/O CM; Future  Former smoker -     CT CHEST LUNG CA SCREEN LOW DOSE W/O CM; Future    Patient Instructions       IF you received an x-ray today, you will receive an invoice from [facility]. Please contact [facility] at [phone] with  questions or concerns regarding your invoice.   IF you received labwork today, you will receive an invoice from [facility]. Please contact [facility] at [phone] with questions or concerns regarding your invoice.   Our billing staff will not be able to assist you with questions regarding bills from these companies.  You will be contacted with the lab results as soon as they are available. The fastest way to get your results is to activate your My Chart account. Instructions are located on the last page of this paperwork. If you have not heard from [place] regarding the results in 2 weeks, please contact this office.     Rectal Bleeding Rectal bleeding is when blood comes out of the opening of the butt (anus). People with this kind of bleeding may notice bright red blood in their underwear or in the toilet after they poop (have a bowel movement). They may also have dark red or black poop (stool). Rectal bleeding is often a sign that something is wrong. It needs to be checked by a doctor. Follow these instructions at home: Watch for any changes in your condition. Take these actions to help with bleeding and discomfort:  Eat a diet that is high in fiber. This will keep your poop soft so it is easier for you to poop without pushing too hard. Ask your doctor to tell you what foods and drinks are high in fiber.  Drink enough fluid to keep your pee  (urine) clear or pale yellow. This also helps keep your poop soft.  Try taking a warm bath. This may help with pain.  Keep all follow-up visits as told by your doctor. This is important.  Get help right away if:  You have new bleeding.  You have more bleeding than before.  You have black or dark red poop.  You throw up (vomit) blood or something that looks like coffee grounds.  You have pain or tenderness in your belly (abdomen).  You have a fever.  You feel weak.  You feel sick to your stomach (nauseous).  You pass out (faint).  You have very bad pain in your butt.  You cannot poop. This information is not intended to replace advice given to you by your health care provider. Make sure you discuss any questions you have with your health care provider. Document Released: 06/03/2011 Document Revised: 02/27/2016 Document Reviewed: 11/17/2015 Elsevier Interactive Patient Education  2018 [employer].      Agustina Caroli, MD [facility]

## 2018-04-06 NOTE — Patient Instructions (Addendum)
     IF you received an x-ray today, you will receive an invoice from [facility]. Please contact [facility] at [phone] with questions or concerns regarding your invoice.   IF you received labwork today, you will receive an invoice from [facility]. Please contact [facility] at [phone] with questions or concerns regarding your invoice.   Our billing staff will not be able to assist you with questions regarding bills from these companies.  You will be contacted with the lab results as soon as they are available. The fastest way to get your results is to activate your My Chart account. Instructions are located on the last page of this paperwork. If you have not heard from [place] regarding the results in 2 weeks, please contact this office.     Rectal Bleeding Rectal bleeding is when blood comes out of the opening of the butt (anus). People with this kind of bleeding may notice bright red blood in their underwear or in the toilet after they poop (have a bowel movement). They may also have dark red or black poop (stool). Rectal bleeding is often a sign that something is wrong. It needs to be checked by a doctor. Follow these instructions at home: Watch for any changes in your condition. Take these actions to help with bleeding and discomfort:  Eat a diet that is high in fiber. This will keep your poop soft so it is easier for you to poop without pushing too hard. Ask your doctor to tell you what foods and drinks are high in fiber.  Drink enough fluid to keep your pee (urine) clear or pale yellow. This also helps keep your poop soft.  Try taking a warm bath. This may help with pain.  Keep all follow-up visits as told by your doctor. This is important.  Get help right away if:  You have new bleeding.  You have more bleeding than before.  You have black or dark red poop.  You throw up (vomit) blood or something that looks like coffee grounds.  You have pain or  tenderness in your belly (abdomen).  You have a fever.  You feel weak.  You feel sick to your stomach (nauseous).  You pass out (faint).  You have very bad pain in your butt.  You cannot poop. This information is not intended to replace advice given to you by your health care provider. Make sure you discuss any questions you have with your health care provider. Document Released: 06/03/2011 Document Revised: 02/27/2016 Document Reviewed: 11/17/2015 Elsevier Interactive Patient Education  [employer].

## 2018-04-07 ENCOUNTER — Encounter: Payer: Self-pay | Admitting: Emergency Medicine

## 2018-04-07 LAB — CBC WITH DIFFERENTIAL/PLATELET
Basophils Absolute: 0 10*3/uL (ref 0.0–0.2)
Basos: 0 %
EOS (ABSOLUTE): 0.3 10*3/uL (ref 0.0–0.4)
Eos: 5 %
Hematocrit: 40.4 % (ref 37.5–51.0)
Hemoglobin: 13 g/dL (ref 13.0–17.7)
Immature Grans (Abs): 0 10*3/uL (ref 0.0–0.1)
Immature Granulocytes: 0 %
Lymphocytes Absolute: 2 10*3/uL (ref 0.7–3.1)
Lymphs: 31 %
MCH: 30.6 pg (ref 26.6–33.0)
MCHC: 32.2 g/dL (ref 31.5–35.7)
MCV: 95 fL (ref 79–97)
Monocytes Absolute: 0.6 10*3/uL (ref 0.1–0.9)
Monocytes: 9 %
Neutrophils Absolute: 3.5 10*3/uL (ref 1.4–7.0)
Neutrophils: 55 %
Platelets: 252 10*3/uL (ref 150–450)
RBC: 4.25 x10E6/uL (ref 4.14–5.80)
RDW: 14 % (ref 12.3–15.4)
WBC: 6.4 10*3/uL (ref 3.4–10.8)

## 2018-04-07 LAB — BASIC METABOLIC PANEL
BUN/Creatinine Ratio: 17 (ref 10–24)
BUN: 16 mg/dL (ref 8–27)
CO2: 24 mmol/L (ref 20–29)
Calcium: 9 mg/dL (ref 8.6–10.2)
Chloride: 105 mmol/L (ref 96–106)
Creatinine, Ser: 0.94 mg/dL (ref 0.76–1.27)
GFR calc Af Amer: 94 mL/min/{1.73_m2} (ref >59)
GFR calc non Af Amer: 81 mL/min/{1.73_m2} (ref >59)
Glucose: 101 mg/dL — ABNORMAL HIGH (ref 65–99)
Potassium: 4.3 mmol/L (ref 3.5–5.2)
Sodium: 142 mmol/L (ref 134–144)

## 2018-05-12 ENCOUNTER — Ambulatory Visit (HOSPITAL_COMMUNITY)
Admission: RE | Admit: 2018-05-12 | Discharge: 2018-05-12 | Disposition: A | Discharge status: Home / Self Care | Payer: Medicare Other | Source: Ambulatory Visit | Attending: Emergency Medicine | Admitting: Emergency Medicine

## 2018-05-12 DIAGNOSIS — Z122 Encounter for screening for malignant neoplasm of respiratory organs: Secondary | ICD-10-CM | POA: Diagnosis not present

## 2018-05-12 DIAGNOSIS — I700 Atherosclerosis of aorta: Secondary | ICD-10-CM | POA: Diagnosis not present

## 2018-05-12 DIAGNOSIS — J439 Emphysema, unspecified: Secondary | ICD-10-CM | POA: Diagnosis not present

## 2018-05-12 DIAGNOSIS — Z87891 Personal history of nicotine dependence: Secondary | ICD-10-CM | POA: Diagnosis not present

## 2018-05-12 DIAGNOSIS — I2510 Atherosclerotic heart disease of native coronary artery without angina pectoris: Secondary | ICD-10-CM | POA: Insufficient documentation

## 2018-05-13 DIAGNOSIS — M4696 Unspecified inflammatory spondylopathy, lumbar region: Secondary | ICD-10-CM | POA: Diagnosis not present

## 2018-05-14 ENCOUNTER — Other Ambulatory Visit: Payer: Self-pay | Admitting: Emergency Medicine

## 2018-05-14 ENCOUNTER — Encounter: Payer: Self-pay | Admitting: Radiology

## 2018-05-14 DIAGNOSIS — R911 Solitary pulmonary nodule: Secondary | ICD-10-CM

## 2018-05-16 DIAGNOSIS — M47816 Spondylosis without myelopathy or radiculopathy, lumbar region: Secondary | ICD-10-CM | POA: Diagnosis not present

## 2018-06-02 ENCOUNTER — Encounter: Payer: Self-pay | Admitting: Internal Medicine

## 2018-06-02 ENCOUNTER — Ambulatory Visit (INDEPENDENT_AMBULATORY_CARE_PROVIDER_SITE_OTHER): Payer: Medicare Other | Admitting: Internal Medicine

## 2018-06-02 VITALS — BP 124/68 | HR 70 | Ht 68.0 in | Wt 163.4 lb

## 2018-06-02 DIAGNOSIS — R911 Solitary pulmonary nodule: Secondary | ICD-10-CM

## 2018-06-02 DIAGNOSIS — J449 Chronic obstructive pulmonary disease, unspecified: Secondary | ICD-10-CM | POA: Diagnosis not present

## 2018-06-02 DIAGNOSIS — R918 Other nonspecific abnormal finding of lung field: Secondary | ICD-10-CM | POA: Insufficient documentation

## 2018-06-02 NOTE — Assessment & Plan Note (Addendum)
Quit smoking 09/2017  - CT 05/12/18 Mild changes of emphysema identified.  - Spirometry 06/02/2018  FEV1 1.90 (64%)  Ratio 66 with mild curvature  Off all rx      I reviewed the Fletcher curve with the patient that basically indicates  if you quit smoking when your best day FEV1 is still well preserved (as is still   the case here)  it is highly unlikely you will progress to severe disease and informed the patient there was  no medication on the market that has proven to alter the curve/ its downward trajectory  or the likelihood of progression of their disease(unlike other chronic medical conditions such as atheroclerosis where we do think we can change the natural hx with risk reducing meds)    Therefore stopping smoking and maintaining abstinence are  the most important aspects of care, not choice of inhalers or for that matter, doctors.   Treatment other than smoking cessation  is entirely directed by severity of symptoms and focused also on reducing exacerbations, not attempting to change the natural history of the disease.  Since he has no symptoms or flare tendency he is group A and no need for rx x prn saba for flares or if really gets more limited by breathing from desired activities: LAMA = spiriva or tudorza or Incruse, depending on device preference and insurance.

## 2018-06-02 NOTE — Progress Notes (Signed)
Kevin Shepard, male    DOB: 31-Oct-1945,   MRN: [id number]   Brief patient profile:  62 yowm quit smoking 09/2017 with doe x around 2013 while in [place] could not hike any more like he was used to and pulmonary eval >  ? Copd severity > advair no better and breathing some worse and some better since Arrived in [place] July 2018 with h/o MPN's so referred to pulmonary clinic 06/02/2018 by Dr   Mitchel Honour     06/02/2018  1st pulmonary ov/ Cassi Jenne  Chief Complaint  Patient presents with  . Pulmonary Consult    Referred by Dr Mitchel Honour for eval of pulmonary nodule. Pt c/o DOE off and on for the past several years. He gets winded walking up an incline.   Dyspnea:  MMRC1 = can walk nl pace, flat grade, can't hurry or go uphills or steps s sob  / no variability Cough: none now/ some while smoking Sleep: on either side usually L side down / bed is flat SABA use: none    No obvious day to day or daytime variability or assoc excess/ purulent sputum or mucus plugs or hemoptysis or cp or chest tightness, subjective wheeze or overt sinus or hb symptoms.   Sleep as above without nocturnal  or early am exacerbation  of respiratory  c/o's or need for noct saba. Also denies any obvious fluctuation of symptoms with weather or environmental changes or other aggravating or alleviating factors except as outlined above.   No unusual exposure hx or h/o childhood pna/ asthma or knowledge of premature birth.  Current Allergies, Complete Past Medical History, Past Surgical History, Family History, and Social History were reviewed in [employer] record.  ROS  The following are not active complaints unless bolded Hoarseness, sore throat, dysphagia, dental problems- upper and lower implants , itching, sneezing,  nasal congestion or discharge of excess mucus or purulent secretions, ear ache,   fever, chills, sweats, unintended wt loss or wt gain, classically pleuritic or exertional cp,  orthopnea pnd or  arm/hand swelling  > leg swelling x sev years, presyncope, palpitations, abdominal pain, anorexia, nausea, vomiting, diarrhea  or change in bowel habits or change in bladder habits, change in stools or change in urine, dysuria, hematuria,  rash, arthralgias, visual complaints, headache, numbness, weakness or ataxia or problems with walking or coordination,  change in mood or  memory.           Past Medical History:  Diagnosis Date  . COPD (chronic obstructive pulmonary disease) ([place])   . Hyperlipidemia     Outpatient Medications Prior to Visit  Medication Sig Dispense Refill  . acyclovir (ZOVIRAX) 400 MG tablet Take 400 mg by mouth 2 (two) times daily.     No facility-administered medications prior to visit.              Objective:     BP 124/68 (BP Location: Left Arm, Cuff Size: Normal)   Pulse 70   Ht 5\' 8"  (1.727 m)   Wt 163 lb 6.4 oz (74.1 kg)   SpO2 96%   BMI 24.84 kg/m   SpO2: 96 %  RA    amb wm nad    HEENT: nl dentition, turbinates bilaterally, and oropharynx. Nl external ear canals without cough reflex   NECK :  without JVD/Nodes/TM/ nl carotid upstrokes bilaterally   LUNGS: no acc muscle use,  Nl contour chest which is clear to A and P bilaterally without cough on  insp or exp maneuvers   CV:  RRR  no s3 or murmur or increase in P2, and no edema   ABD:  soft and nontender with nl inspiratory excursion in the supine position. No bruits or organomegaly appreciated, bowel sounds nl  MS:  Nl gait/ ext warm without deformities, calf tenderness, cyanosis or clubbing No obvious joint restrictions   SKIN: warm and dry without lesions    NEURO:  alert, approp, nl sensorium with  no motor or cerebellar deficits apparent.           Assessment   COPD  GOLD II/ Group A Quit smoking 09/2017  - CT 05/12/18 Mild changes of emphysema identified.  - Spirometry 06/02/2018  FEV1 1.90 (64%)  Ratio 66 with mild curvature  Off all rx      I reviewed the  Fletcher curve with the patient that basically indicates  if you quit smoking when your best day FEV1 is still well preserved (as is still   the case here)  it is highly unlikely you will progress to severe disease and informed the patient there was  no medication on the market that has proven to alter the curve/ its downward trajectory  or the likelihood of progression of their disease(unlike other chronic medical conditions such as atheroclerosis where we do think we can change the natural hx with risk reducing meds)    Therefore stopping smoking and maintaining abstinence are  the most important aspects of care, not choice of inhalers or for that matter, doctors.   Treatment other than smoking cessation  is entirely directed by severity of symptoms and focused also on reducing exacerbations, not attempting to change the natural history of the disease.  Since he has no symptoms or flare tendency he is group A and no need for rx x prn saba for flares or if really gets more limited by breathing from desired activities: LAMA = spiriva or tudorza or Incruse, depending on device preference and insurance.   Left upper lobe pulmonary nodule CT  02/11/12  x  7 mm CT  05/12/18 = 9.1 mm  rec repeat in 12 months (placed in reminder file but defer to PCP to arrange)    CT results reviewed with pt >>> Too small for PET or bx, not suspicious enough for excisional bx > really only option for now is follow the Fleischner society guidelines as rec by radiology> in this case since it only grew 2 mm in 6 years reasonable to repeat in a year as part of his LDSCT since quit smoking 8 m ago  Discussed in detail all the  indications, usual  risks and alternatives  relative to the benefits with patient who agrees to proceed with conservative f/u as outlined      Total time devoted to counseling  > 50 % of initial 60 min office visit:  review case with pt/ discussion of options/alternatives/ personally creating written  customized instructions  in presence of pt  then going over those specific  Instructions directly with the pt including how to use all of the meds but in particular covering each new medication in detail and the difference between the maintenance= "automatic" meds and the prns using an action plan format for the latter (If this problem/symptom => do that organization reading Left to right).  Please see AVS from this visit for a full list of these instructions which I personally wrote for this pt and  are unique to this visit.  Christinia Gully, MD 06/02/2018

## 2018-06-02 NOTE — Patient Instructions (Addendum)
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   Pulmonary follow up is as needed

## 2018-06-02 NOTE — Assessment & Plan Note (Addendum)
CT  02/11/12  x  7 mm CT  05/12/18 = 9.1 mm  rec repeat in 12 months (placed in reminder file but defer to PCP to arrange)    CT results reviewed with pt >>> Too small for PET or bx, not suspicious enough for excisional bx > really only option for now is follow the Fleischner society guidelines as rec by radiology> in this case since it only grew 2 mm in 6 years reasonable to repeat in a year as part of his LDSCT since quit smoking 8 m ago  Discussed in detail all the  indications, usual  risks and alternatives  relative to the benefits with patient who agrees to proceed with conservative f/u as outlined      Total time devoted to counseling  > 50 % of initial 60 min office visit:  review case with pt/ discussion of options/alternatives/ personally creating written customized instructions  in presence of pt  then going over those specific  Instructions directly with the pt including how to use all of the meds but in particular covering each new medication in detail and the difference between the maintenance= "automatic" meds and the prns using an action plan format for the latter (If this problem/symptom => do that organization reading Left to right).  Please see AVS from this visit for a full list of these instructions which I personally wrote for this pt and  are unique to this visit.

## 2018-06-07 ENCOUNTER — Ambulatory Visit (INDEPENDENT_AMBULATORY_CARE_PROVIDER_SITE_OTHER): Payer: Medicare Other | Admitting: Emergency Medicine

## 2018-06-07 ENCOUNTER — Ambulatory Visit (INDEPENDENT_AMBULATORY_CARE_PROVIDER_SITE_OTHER): Payer: Medicare Other

## 2018-06-07 ENCOUNTER — Other Ambulatory Visit: Payer: Self-pay

## 2018-06-07 ENCOUNTER — Encounter: Payer: Self-pay | Admitting: Emergency Medicine

## 2018-06-07 VITALS — BP 118/64 | HR 54 | Temp 98.2°F | Resp 16 | Ht 68.25 in | Wt 163.8 lb

## 2018-06-07 DIAGNOSIS — Z23 Encounter for immunization: Secondary | ICD-10-CM

## 2018-06-07 DIAGNOSIS — M7661 Achilles tendinitis, right leg: Secondary | ICD-10-CM | POA: Diagnosis not present

## 2018-06-07 DIAGNOSIS — L989 Disorder of the skin and subcutaneous tissue, unspecified: Secondary | ICD-10-CM

## 2018-06-07 DIAGNOSIS — Z8619 Personal history of other infectious and parasitic diseases: Secondary | ICD-10-CM | POA: Diagnosis not present

## 2018-06-07 DIAGNOSIS — S99921A Unspecified injury of right foot, initial encounter: Secondary | ICD-10-CM

## 2018-06-07 MED ORDER — DICLOFENAC SODIUM 1 % TD GEL: 2.0000 g | Freq: Two times a day (BID) | TRANSDERMAL | 3 refills | Status: AC

## 2018-06-07 MED ORDER — ACYCLOVIR 200 MG PO CAPS: 200.0000 mg | ORAL_CAPSULE | Freq: Four times a day (QID) | ORAL | 0 refills | Status: DC

## 2018-06-07 NOTE — Progress Notes (Signed)
Kevin Shepard 72 y.o.   Chief Complaint  Patient presents with  . Foot Injury    RIGHT with pain x 4-5 weeks ago    HISTORY OF PRESENT ILLNESS: This is a 72 y.o. male complaining of pain to right foot since he sustained an injury 4 to 5 weeks ago.  HPI   Prior to Admission medications   Not on File    No Known Allergies  Patient Active Problem List   Diagnosis Date Noted  . COPD  GOLD II/ Group A 06/02/2018  . Left upper lobe pulmonary nodule 06/02/2018  . Rectal bleeding 04/06/2018  . Former smoker 04/06/2018  . Other chest pain 01/10/2018  . Palpitation 01/10/2018  . Numbness of fingers of both hands 01/10/2018  . Alcohol use 01/10/2018  . 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 Chronic bilateral low back pain without sciatica 01/10/2018  . History of tobacco use 01/10/2018  . Encounter for screening for malignant neoplasm of respiratory organs 01/10/2018  . History of back surgery 01/10/2018    Past Medical History:  Diagnosis Date  . COPD (chronic obstructive pulmonary disease) ([place])   . Hyperlipidemia     Past Surgical History:  Procedure Laterality Date  . APPENDECTOMY     per patient 1950's  . CESAREAN SECTION  03/2017  . FRACTURE SURGERY  1974   right clavicle  . LUMBAR SPINE SURGERY  2018  . SPINE SURGERY  10/2016  . TONSILLECTOMY      Social History   Socioeconomic History  . Marital status: Married    Spouse name: Not on file  . Number of children: Not on file  . Years of education: Not on file  . Highest education level: Not on file  Occupational History  . Occupation: Merchandiser, retail trading  Social Needs  . Financial resource strain: Not on file  . Food insecurity:    Worry: Not on file    Inability: Not on file  . Transportation needs:    Medical: Not on file    Non-medical: Not on file  Tobacco Use  . Smoking status: Former Smoker    Packs/day: 1.00    Years: 40.00    Pack years: 40.00    Last attempt to quit: 10/04/2017     Years since quitting: 0.6  . Smokeless tobacco: Never Used  Substance and Sexual Activity  . Alcohol use: Yes  . Drug use: Never  . Sexual activity: Yes    Partners: Female  Lifestyle  . Physical activity:    Days per week: Not on file    Minutes per session: Not on file  . Stress: Not on file  Relationships  . Social connections:    Talks on phone: Not on file    Gets together: Not on file    Attends religious service: Not on file    Active member of club or organization: Not on file    Attends meetings of clubs or organizations: Not on file    Relationship status: Not on file  . Intimate partner violence:    Fear of current or ex partner: Not on file    Emotionally abused: Not on file    Physically abused: Not on file    Forced sexual activity: Not on file  Other Topics Concern  . Not on file  Social History Narrative  . Not on file    Family History  Problem Relation Age of Onset  . Cancer Mother   . Early  death Father   . Heart disease Father 78       rheumatic heart disease  . Hyperlipidemia Sister      Review of Systems  Constitutional: Negative.  Negative for chills and fever.  HENT: Negative.   Eyes: Negative.   Cardiovascular: Negative.  Negative for chest pain and palpitations.  Gastrointestinal: Negative.   Genitourinary: Negative.   Musculoskeletal:       Right foot pain  Skin:       Skin lesions, diffuse  Neurological: Negative.  Negative for dizziness and headaches.  Endo/Heme/Allergies: Negative.   All other systems reviewed and are negative.  Vitals:   06/07/18 1631  BP: 118/64  Pulse: (!) 54  Resp: 16  Temp: 98.2 F (36.8 C)  SpO2: 96%     Physical Exam  Constitutional: He is oriented to person, place, and time. He appears well-developed and well-nourished.  HENT:  Head: Normocephalic and atraumatic.  Eyes: Pupils are equal, round, and reactive to light. EOM are normal.  Neck: Normal range of motion. Neck supple.    Cardiovascular: Normal rate and regular rhythm.  Pulmonary/Chest: Effort normal.  Musculoskeletal:  Right foot: No erythema or bruising.  Positive tenderness at distal insertion site of Achilles tendon.  NVI.  Neurological: He is alert and oriented to person, place, and time. No sensory deficit. He exhibits normal muscle tone.  Skin: Skin is warm and dry. Capillary refill takes less than 2 seconds. No rash noted.  Psychiatric: He has a normal mood and affect. His behavior is normal.  Vitals reviewed.  Dg Foot Complete Right  Result Date: 06/07/2018 CLINICAL DATA:  Injury to the right foot EXAM: RIGHT FOOT COMPLETE - 3+ VIEW COMPARISON:  None. FINDINGS: Tarsal-metatarsal alignment is normal. Very mild degenerative change of the right first MTP joint is present. No acute fracture is seen and no erosion is noted. IMPRESSION: No acute fracture. Mild degenerative change of the right first MTP joint. Electronically Signed   By: Ivar Drape M.D.   On: 06/07/2018 16:59     ASSESSMENT & PLAN: Kevin Shepard was seen today for foot injury.  Diagnoses and all orders for this visit:  Achilles tendinitis of right lower extremity -     diclofenac sodium (VOLTAREN) 1 % GEL; Apply 2 g topically 2 (two) times daily for 7 days.  Need for prophylactic vaccination and inoculation against influenza -     Flu vaccine HIGH DOSE PF  Injury of right foot, initial encounter -     DG Foot Complete Right; Future  History of herpes simplex infection -     acyclovir (ZOVIRAX) 200 MG capsule; Take 1 capsule (200 mg total) by mouth 4 (four) times daily for 7 days. At time of flare-up.  Skin lesions -     Ambulatory referral to Dermatology    Patient Instructions       If you have lab work done today you will be contacted with your lab results within the next 2 weeks.  If you have not heard from [place] then please contact us. The fastest way to get your results is to register for My Chart.   IF you received an  x-ray today, you will receive an invoice from [facility]. Please contact [facility] at [phone] with questions or concerns regarding your invoice.   IF you received labwork today, you will receive an invoice from [facility]. Please contact [facility] at [phone] with questions or concerns regarding your invoice.   Our billing  staff will not be able to assist you with questions regarding bills from these companies.  You will be contacted with the lab results as soon as they are available. The fastest way to get your results is to activate your My Chart account. Instructions are located on the last page of this paperwork. If you have not heard from [place] regarding the results in 2 weeks, please contact this office.     Achilles Tendinitis Achilles tendinitis is inflammation of the tough, cord-like band that attaches the lower leg muscles to the heel bone (Achilles tendon). This is usually caused by overusing the tendon and the ankle joint. Achilles tendinitis usually gets better over time with treatment and caring for yourself at home. It can take weeks or months to heal completely. What are the causes? This condition may be caused by:  A sudden increase in exercise or activity, such as running.  Doing the same exercises or activities (such as jumping) over and over.  Not warming up calf muscles before exercising.  Exercising in shoes that are worn out or not made for exercise.  Having arthritis or a bone growth (spur) on the back of the heel bone. This can rub against the tendon and hurt it.  Age-related wear and tear. Tendons become less flexible with age and more likely to be injured.  What are the signs or symptoms? Common symptoms of this condition include:  Pain in the Achilles tendon or in the back of the leg, just above the heel. The pain usually gets worse with exercise.  Stiffness or soreness in the back of the leg, especially in the morning.  Swelling  of the skin over the Achilles tendon.  Thickening of the tendon.  Bone spurs at the bottom of the Achilles tendon, near the heel.  Trouble standing on tiptoe.  How is this diagnosed? This condition is diagnosed based on your symptoms and a physical exam. You may have tests, including:  X-rays.  MRI.  How is this treated? The goal of treatment is to relieve symptoms and help your injury heal. Treatment may include:  Decreasing or stopping activities that caused the tendinitis. This may mean switching to low-impact exercises like biking or swimming.  Icing the injured area.  Doing physical therapy, including strengthening and stretching exercises.  NSAIDs to help relieve pain and swelling.  Using supportive shoes, wraps, heel lifts, or a walking boot (air cast).  Surgery. This may be done if your symptoms do not improve after 6 months.  Using high-energy shock wave impulses to stimulate the healing process (extracorporeal shock wave therapy). This is rare.  Injection of medicines to help relieve inflammation (corticosteroids). This is rare.  Follow these instructions at home: If you have an air cast:  Wear the cast as told by your health care provider. Remove it only as told by your health care provider.  Loosen the cast if your toes tingle, become numb, or turn cold and blue. Activity  Gradually return to your normal activities once your health care provider approves. Do not do activities that cause pain. ? Consider doing low-impact exercises, like cycling or swimming.  If you have an air cast, ask your health care provider when it is safe for you to drive.  If physical therapy was prescribed, do exercises as told by your health care provider or physical therapist. Managing pain, stiffness, and swelling  Raise (elevate) your foot above the level of your heart while you are sitting or lying down.  Move your toes often to avoid stiffness and to lessen swelling.  If  directed, put ice on the injured area: ? Put ice in a plastic bag. ? Place a towel between your skin and the bag. ? Leave the ice on for 20 minutes, 2-3 times a day General instructions  If directed, wrap your foot with an elastic bandage or other wrap. This can help keep your tendon from moving too much while it heals. Your health care provider will show you how to wrap your foot correctly.  Wear supportive shoes or heel lifts only as told by your health care provider.  Take over-the-counter and prescription medicines only as told by your health care provider.  Keep all follow-up visits as told by your health care provider. This is important. Contact a health care provider if:  You have symptoms that gets worse.  You have pain that does not get better with medicine.  You develop new, unexplained symptoms.  You develop warmth and swelling in your foot.  You have a fever. Get help right away if:  You have a sudden popping sound or sensation in your Achilles tendon followed by severe pain.  You cannot move your toes or foot.  You cannot put any weight on your foot. Summary  Achilles tendinitis is inflammation of the tough, cord-like band that attaches the lower leg muscles to the heel bone (Achilles tendon).  This condition is usually caused by overusing the tendon and the ankle joint. It can also be caused by arthritis or normal aging.  The most common symptoms of this condition include pain, swelling, or stiffness in the Achilles tendon or in the back of the leg.  This condition is usually treated with rest, NSAIDs, and physical therapy. This information is not intended to replace advice given to you by your health care provider. Make sure you discuss any questions you have with your health care provider. Document Released: 07/01/2005 Document Revised: 08/10/2016 Document Reviewed: 08/10/2016 Elsevier Interactive Patient Education  2017 [employer]      Agustina Caroli, MD [facility]

## 2018-06-07 NOTE — Patient Instructions (Addendum)
If you have lab work done today you will be contacted with your lab results within the next 2 weeks.  If you have not heard from [place] then please contact us. The fastest way to get your results is to register for My Chart.   IF you received an x-ray today, you will receive an invoice from [facility]. Please contact [facility] at [phone] with questions or concerns regarding your invoice.   IF you received labwork today, you will receive an invoice from [facility]. Please contact [facility] at [phone] with questions or concerns regarding your invoice.   Our billing staff will not be able to assist you with questions regarding bills from these companies.  You will be contacted with the lab results as soon as they are available. The fastest way to get your results is to activate your My Chart account. Instructions are located on the last page of this paperwork. If you have not heard from [place] regarding the results in 2 weeks, please contact this office.     Achilles Tendinitis Achilles tendinitis is inflammation of the tough, cord-like band that attaches the lower leg muscles to the heel bone (Achilles tendon). This is usually caused by overusing the tendon and the ankle joint. Achilles tendinitis usually gets better over time with treatment and caring for yourself at home. It can take weeks or months to heal completely. What are the causes? This condition may be caused by:  A sudden increase in exercise or activity, such as running.  Doing the same exercises or activities (such as jumping) over and over.  Not warming up calf muscles before exercising.  Exercising in shoes that are worn out or not made for exercise.  Having arthritis or a bone growth (spur) on the back of the heel bone. This can rub against the tendon and hurt it.  Age-related wear and tear. Tendons become less flexible with age and more likely to be injured.  What are the signs or  symptoms? Common symptoms of this condition include:  Pain in the Achilles tendon or in the back of the leg, just above the heel. The pain usually gets worse with exercise.  Stiffness or soreness in the back of the leg, especially in the morning.  Swelling of the skin over the Achilles tendon.  Thickening of the tendon.  Bone spurs at the bottom of the Achilles tendon, near the heel.  Trouble standing on tiptoe.  How is this diagnosed? This condition is diagnosed based on your symptoms and a physical exam. You may have tests, including:  X-rays.  MRI.  How is this treated? The goal of treatment is to relieve symptoms and help your injury heal. Treatment may include:  Decreasing or stopping activities that caused the tendinitis. This may mean switching to low-impact exercises like biking or swimming.  Icing the injured area.  Doing physical therapy, including strengthening and stretching exercises.  NSAIDs to help relieve pain and swelling.  Using supportive shoes, wraps, heel lifts, or a walking boot (air cast).  Surgery. This may be done if your symptoms do not improve after 6 months.  Using high-energy shock wave impulses to stimulate the healing process (extracorporeal shock wave therapy). This is rare.  Injection of medicines to help relieve inflammation (corticosteroids). This is rare.  Follow these instructions at home: If you have an air cast:  Wear the cast as told by your health care provider. Remove it only as told by your health care  provider.  Loosen the cast if your toes tingle, become numb, or turn cold and blue. Activity  Gradually return to your normal activities once your health care provider approves. Do not do activities that cause pain. ? Consider doing low-impact exercises, like cycling or swimming.  If you have an air cast, ask your health care provider when it is safe for you to drive.  If physical therapy was prescribed, do exercises as  told by your health care provider or physical therapist. Managing pain, stiffness, and swelling  Raise (elevate) your foot above the level of your heart while you are sitting or lying down.  Move your toes often to avoid stiffness and to lessen swelling.  If directed, put ice on the injured area: ? Put ice in a plastic bag. ? Place a towel between your skin and the bag. ? Leave the ice on for 20 minutes, 2-3 times a day General instructions  If directed, wrap your foot with an elastic bandage or other wrap. This can help keep your tendon from moving too much while it heals. Your health care provider will show you how to wrap your foot correctly.  Wear supportive shoes or heel lifts only as told by your health care provider.  Take over-the-counter and prescription medicines only as told by your health care provider.  Keep all follow-up visits as told by your health care provider. This is important. Contact a health care provider if:  You have symptoms that gets worse.  You have pain that does not get better with medicine.  You develop new, unexplained symptoms.  You develop warmth and swelling in your foot.  You have a fever. Get help right away if:  You have a sudden popping sound or sensation in your Achilles tendon followed by severe pain.  You cannot move your toes or foot.  You cannot put any weight on your foot. Summary  Achilles tendinitis is inflammation of the tough, cord-like band that attaches the lower leg muscles to the heel bone (Achilles tendon).  This condition is usually caused by overusing the tendon and the ankle joint. It can also be caused by arthritis or normal aging.  The most common symptoms of this condition include pain, swelling, or stiffness in the Achilles tendon or in the back of the leg.  This condition is usually treated with rest, NSAIDs, and physical therapy. This information is not intended to replace advice given to you by your health  care provider. Make sure you discuss any questions you have with your health care provider. Document Released: 07/01/2005 Document Revised: 08/10/2016 Document Reviewed: 08/10/2016 Elsevier Interactive Patient Education  2017 [employer].

## 2018-06-09 DIAGNOSIS — M47816 Spondylosis without myelopathy or radiculopathy, lumbar region: Secondary | ICD-10-CM | POA: Diagnosis not present

## 2018-06-27 DIAGNOSIS — M47816 Spondylosis without myelopathy or radiculopathy, lumbar region: Secondary | ICD-10-CM | POA: Diagnosis not present

## 2018-07-19 DIAGNOSIS — D229 Melanocytic nevi, unspecified: Secondary | ICD-10-CM | POA: Diagnosis not present

## 2018-07-19 DIAGNOSIS — L814 Other melanin hyperpigmentation: Secondary | ICD-10-CM | POA: Diagnosis not present

## 2018-07-19 DIAGNOSIS — L738 Other specified follicular disorders: Secondary | ICD-10-CM | POA: Diagnosis not present

## 2018-07-19 DIAGNOSIS — L821 Other seborrheic keratosis: Secondary | ICD-10-CM | POA: Diagnosis not present

## 2018-07-19 DIAGNOSIS — L819 Disorder of pigmentation, unspecified: Secondary | ICD-10-CM | POA: Diagnosis not present

## 2018-07-19 DIAGNOSIS — D1801 Hemangioma of skin and subcutaneous tissue: Secondary | ICD-10-CM | POA: Diagnosis not present

## 2018-10-13 ENCOUNTER — Encounter: Payer: Self-pay | Admitting: Emergency Medicine

## 2018-10-13 ENCOUNTER — Ambulatory Visit (INDEPENDENT_AMBULATORY_CARE_PROVIDER_SITE_OTHER): Payer: Medicare Other | Admitting: Emergency Medicine

## 2018-10-13 ENCOUNTER — Other Ambulatory Visit: Payer: Self-pay

## 2018-10-13 VITALS — BP 143/69 | HR 62 | Temp 98.4°F | Resp 16 | Ht 68.5 in | Wt 167.0 lb

## 2018-10-13 DIAGNOSIS — R05 Cough: Secondary | ICD-10-CM

## 2018-10-13 DIAGNOSIS — J22 Unspecified acute lower respiratory infection: Secondary | ICD-10-CM | POA: Diagnosis not present

## 2018-10-13 DIAGNOSIS — R059 Cough, unspecified: Secondary | ICD-10-CM

## 2018-10-13 MED ORDER — AZITHROMYCIN 250 MG PO TABS: ORAL_TABLET | ORAL | 0 refills | Status: DC

## 2018-10-13 NOTE — Progress Notes (Signed)
Kevin Shepard 73 y.o.   Chief Complaint  Patient presents with  . Cough    per patient x 5-6 days  . Headache    HISTORY OF PRESENT ILLNESS: This is a 73 y.o. male complaining mild cough with congestion and headache for the past 5 to 6 days.  Wife also sick with similar symptoms, diagnosed with bronchitis, and treated with amoxicillin.  No other significant symptoms.  HPI   Prior to Admission medications   Medication Sig Start Date End Date Taking? Authorizing Provider  acyclovir (ZOVIRAX) 200 MG capsule Take 1 capsule (200 mg total) by mouth 4 (four) times daily for 7 days. At time of flare-up. 06/07/18 06/14/18  Horald Pollen, MD    Not on File  Patient Active Problem List   Diagnosis Date Noted  . COPD  GOLD II/ Group A 06/02/2018  . Left upper lobe pulmonary nodule 06/02/2018  . Rectal bleeding 04/06/2018  . Former smoker 04/06/2018  . Numbness of fingers of both hands 01/10/2018  . Alcohol use 01/10/2018  . 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 Chronic bilateral low back pain without sciatica 01/10/2018  . History of tobacco use 01/10/2018  . Encounter for screening for malignant neoplasm of respiratory organs 01/10/2018  . History of back surgery 01/10/2018    Past Medical History:  Diagnosis Date  . COPD (chronic obstructive pulmonary disease) ([place])   . Hyperlipidemia     Past Surgical History:  Procedure Laterality Date  . APPENDECTOMY     per patient 1950's  . CESAREAN SECTION  03/2017  . FRACTURE SURGERY  1974   right clavicle  . LUMBAR SPINE SURGERY  2018  . SPINE SURGERY  10/2016  . TONSILLECTOMY      Social History   Socioeconomic History  . Marital status: Married    Spouse name: Not on file  . Number of children: Not on file  . Years of education: Not on file  . Highest education level: Not on file  Occupational History  . Occupation: Merchandiser, retail trading  Social Needs  . Financial resource strain: Not on file  . Food  insecurity:    Worry: Not on file    Inability: Not on file  . Transportation needs:    Medical: Not on file    Non-medical: Not on file  Tobacco Use  . Smoking status: Former Smoker    Packs/day: 1.00    Years: 40.00    Pack years: 40.00    Last attempt to quit: 10/04/2017    Years since quitting: 1.0  . Smokeless tobacco: Never Used  Substance and Sexual Activity  . Alcohol use: Yes  . Drug use: Never  . Sexual activity: Yes    Partners: Female  Lifestyle  . Physical activity:    Days per week: Not on file    Minutes per session: Not on file  . Stress: Not on file  Relationships  . Social connections:    Talks on phone: Not on file    Gets together: Not on file    Attends religious service: Not on file    Active member of club or organization: Not on file    Attends meetings of clubs or organizations: Not on file    Relationship status: Not on file  . Intimate partner violence:    Fear of current or ex partner: Not on file    Emotionally abused: Not on file    Physically abused: Not on file  Forced sexual activity: Not on file  Other Topics Concern  . Not on file  Social History Narrative  . Not on file    Family History  Problem Relation Age of Onset  . Cancer Mother   . Early death Father   . Heart disease Father 79       rheumatic heart disease  . Hyperlipidemia Sister      Review of Systems  Constitutional: Negative.  Negative for chills and fever.  HENT: Positive for congestion.   Eyes: Negative.   Respiratory: Positive for cough.   Cardiovascular: Negative.  Negative for chest pain and palpitations.  Gastrointestinal: Negative.  Negative for abdominal pain, nausea and vomiting.  Musculoskeletal: Negative.   Skin: Negative.   Neurological: Positive for headaches.    Vitals:   10/13/18 1208  BP: (!) 143/69  Pulse: 62  Resp: 16  Temp: 98.4 F (36.9 C)  SpO2: 96%    Physical Exam Vitals signs reviewed.  Constitutional:       Appearance: He is well-developed.  HENT:     Head: Normocephalic and atraumatic.     Nose: Nose normal.     Mouth/Throat:     Mouth: Mucous membranes are moist.     Pharynx: Oropharynx is clear.  Eyes:     Extraocular Movements: Extraocular movements intact.  Neck:     Musculoskeletal: Normal range of motion and neck supple.  Cardiovascular:     Rate and Rhythm: Normal rate and regular rhythm.     Heart sounds: Normal heart sounds.  Pulmonary:     Effort: Pulmonary effort is normal.     Breath sounds: Normal breath sounds.  Musculoskeletal: Normal range of motion.  Lymphadenopathy:     Cervical: No cervical adenopathy.  Skin:    General: Skin is warm and dry.  Neurological:     Mental Status: He is alert and oriented to person, place, and time.      ASSESSMENT & PLAN: [name] was seen today for cough and headache.  Diagnoses and all orders for this visit:  Cough  Lower respiratory infection -     azithromycin (ZITHROMAX) 250 MG tablet; Sig as indicated    Patient Instructions       If you have lab work done today you will be contacted with your lab results within the next 2 weeks.  If you have not heard from [place] then please contact us. The fastest way to get your results is to register for My Chart.   IF you received an x-ray today, you will receive an invoice from [facility]. Please contact [facility] at [phone] with questions or concerns regarding your invoice.   IF you received labwork today, you will receive an invoice from [facility]. Please contact [facility] at [phone] with questions or concerns regarding your invoice.   Our billing staff will not be able to assist you with questions regarding bills from these companies.  You will be contacted with the lab results as soon as they are available. The fastest way to get your results is to activate your My Chart account. Instructions are located on the last page of this paperwork. If  you have not heard from [place] regarding the results in 2 weeks, please contact this office.     Cough, Adult  A cough helps to clear your throat and lungs. A cough may last only 2-3 weeks (acute), or it may last longer than 8 weeks (chronic). Many different things can cause a  cough. A cough may be a sign of an illness or another medical condition. Follow these instructions at home:  Pay attention to any changes in your cough.  Take medicines only as told by your doctor. ? If you were prescribed an antibiotic medicine, take it as told by your doctor. Do not stop taking it even if you start to feel better. ? Talk with your doctor before you try using a cough medicine.  Drink enough fluid to keep your pee (urine) clear or pale yellow.  If the air is dry, use a cold steam vaporizer or humidifier in your home.  Stay away from things that make you cough at work or at home.  If your cough is worse at night, try using extra pillows to raise your head up higher while you sleep.  Do not smoke, and try not to be around smoke. If you need help quitting, ask your doctor.  Do not have caffeine.  Do not drink alcohol.  Rest as needed. Contact a doctor if:  You have new problems (symptoms).  You cough up yellow fluid (pus).  Your cough does not get better after 2-3 weeks, or your cough gets worse.  Medicine does not help your cough and you are not sleeping well.  You have pain that gets worse or pain that is not helped with medicine.  You have a fever.  You are losing weight and you do not know why.  You have night sweats. Get help right away if:  You cough up blood.  You have trouble breathing.  Your heartbeat is very fast. This information is not intended to replace advice given to you by your health care provider. Make sure you discuss any questions you have with your health care provider. Document Released: 06/04/2011 Document Revised: 02/27/2016 Document Reviewed:  11/28/2014 Elsevier Interactive Patient Education  2019 Else[employer] Inc[employer]      Agustina Caroli, MD [facility]

## 2018-10-13 NOTE — Patient Instructions (Addendum)
     If you have lab work done today you will be contacted with your lab results within the next 2 weeks.  If you have not heard from us then please contact us. The fastest way to get your results is to register for My Chart.   IF you received an x-ray today, you will receive an invoice from [facility]. Please contact [facility] at [phone] with questions or concerns regarding your invoice.   IF you received labwork today, you will receive an invoice from [facility]. Please contact [facility] at [phone] with questions or concerns regarding your invoice.   Our billing staff will not be able to assist you with questions regarding bills from these companies.  You will be contacted with the lab results as soon as they are available. The fastest way to get your results is to activate your My Chart account. Instructions are located on the last page of this paperwork. If you have not heard from us regarding the results in 2 weeks, please contact this office.     Cough, Adult  A cough helps to clear your throat and lungs. A cough may last only 2-3 weeks (acute), or it may last longer than 8 weeks (chronic). Many different things can cause a cough. A cough may be a sign of an illness or another medical condition. Follow these instructions at home:  Pay attention to any changes in your cough.  Take medicines only as told by your doctor. ? If you were prescribed an antibiotic medicine, take it as told by your doctor. Do not stop taking it even if you start to feel better. ? Talk with your doctor before you try using a cough medicine.  Drink enough fluid to keep your pee (urine) clear or pale yellow.  If the air is dry, use a cold steam vaporizer or humidifier in your home.  Stay away from things that make you cough at work or at home.  If your cough is worse at night, try using extra pillows to raise your head up higher while you sleep.  Do not smoke, and try not to  be around smoke. If you need help quitting, ask your doctor.  Do not have caffeine.  Do not drink alcohol.  Rest as needed. Contact a doctor if:  You have new problems (symptoms).  You cough up yellow fluid (pus).  Your cough does not get better after 2-3 weeks, or your cough gets worse.  Medicine does not help your cough and you are not sleeping well.  You have pain that gets worse or pain that is not helped with medicine.  You have a fever.  You are losing weight and you do not know why.  You have night sweats. Get help right away if:  You cough up blood.  You have trouble breathing.  Your heartbeat is very fast. This information is not intended to replace advice given to you by your health care provider. Make sure you discuss any questions you have with your health care provider. Document Released: 06/04/2011 Document Revised: 02/27/2016 Document Reviewed: 11/28/2014 Elsevier Interactive Patient Education  2019 [employer] Inc[employer]  

## 2019-04-25 DIAGNOSIS — Z1159 Encounter for screening for other viral diseases: Secondary | ICD-10-CM | POA: Diagnosis not present

## 2019-04-25 DIAGNOSIS — Z7189 Other specified counseling: Secondary | ICD-10-CM | POA: Diagnosis not present

## 2019-06-14 DIAGNOSIS — Z20828 Contact with and (suspected) exposure to other viral communicable diseases: Secondary | ICD-10-CM | POA: Diagnosis not present

## 2019-07-03 DIAGNOSIS — Z23 Encounter for immunization: Secondary | ICD-10-CM | POA: Diagnosis not present

## 2019-07-17 ENCOUNTER — Encounter: Payer: Self-pay | Admitting: Emergency Medicine

## 2019-07-17 ENCOUNTER — Other Ambulatory Visit: Payer: Self-pay

## 2019-07-17 ENCOUNTER — Ambulatory Visit (INDEPENDENT_AMBULATORY_CARE_PROVIDER_SITE_OTHER): Payer: Medicare Other

## 2019-07-17 ENCOUNTER — Ambulatory Visit (INDEPENDENT_AMBULATORY_CARE_PROVIDER_SITE_OTHER): Payer: Medicare Other | Admitting: Emergency Medicine

## 2019-07-17 VITALS — BP 124/73 | HR 76 | Temp 98.8°F | Resp 16 | Ht 69.0 in | Wt 169.2 lb

## 2019-07-17 DIAGNOSIS — R0600 Dyspnea, unspecified: Secondary | ICD-10-CM | POA: Diagnosis not present

## 2019-07-17 DIAGNOSIS — G4700 Insomnia, unspecified: Secondary | ICD-10-CM

## 2019-07-17 DIAGNOSIS — J439 Emphysema, unspecified: Secondary | ICD-10-CM

## 2019-07-17 DIAGNOSIS — Z125 Encounter for screening for malignant neoplasm of prostate: Secondary | ICD-10-CM

## 2019-07-17 DIAGNOSIS — R0781 Pleurodynia: Secondary | ICD-10-CM

## 2019-07-17 DIAGNOSIS — R0609 Other forms of dyspnea: Secondary | ICD-10-CM

## 2019-07-17 DIAGNOSIS — I700 Atherosclerosis of aorta: Secondary | ICD-10-CM

## 2019-07-17 DIAGNOSIS — R079 Chest pain, unspecified: Secondary | ICD-10-CM | POA: Diagnosis not present

## 2019-07-17 MED ORDER — ESZOPICLONE 2 MG PO TABS: 2.0000 mg | ORAL_TABLET | Freq: Every evening | ORAL | 1 refills | Status: DC | PRN

## 2019-07-17 MED ORDER — BUDESONIDE-FORMOTEROL FUMARATE 80-4.5 MCG/ACT IN AERO: 2.0000 | INHALATION_SPRAY | Freq: Two times a day (BID) | RESPIRATORY_TRACT | 3 refills | Status: DC

## 2019-07-17 NOTE — Patient Instructions (Addendum)
If you have lab work done today you will be contacted with your lab results within the next 2 weeks.  If you have not heard from [place] then please contact us. The fastest way to get your results is to register for My Chart.   IF you received an x-ray today, you will receive an invoice from [facility]. Please contact [facility] at [phone] with questions or concerns regarding your invoice.   IF you received labwork today, you will receive an invoice from [facility]. Please contact [facility] at [phone] with questions or concerns regarding your invoice.   Our billing staff will not be able to assist you with questions regarding bills from these companies.  You will be contacted with the lab results as soon as they are available. The fastest way to get your results is to activate your My Chart account. Instructions are located on the last page of this paperwork. If you have not heard from [place] regarding the results in 2 weeks, please contact this office.      Chronic Obstructive Pulmonary Disease Chronic obstructive pulmonary disease (COPD) is a long-term (chronic) lung problem. When you have COPD, it is hard for air to get in and out of your lungs. Usually the condition gets worse over time, and your lungs will never return to normal. There are things you can do to keep yourself as healthy as possible.  Your doctor may treat your condition with: ? Medicines. ? Oxygen. ? Lung surgery.  Your doctor may also recommend: ? Rehabilitation. This includes steps to make your body work better. It may involve a team of specialists. ? Quitting smoking, if you smoke. ? Exercise and changes to your diet. ? Comfort measures (palliative care). Follow these instructions at home: Medicines  Take over-the-counter and prescription medicines only as told by your doctor.  Talk to your doctor before taking any cough or allergy medicines. You may need to avoid medicines that cause  your lungs to be dry. Lifestyle  If you smoke, stop. Smoking makes the problem worse. If you need help quitting, ask your doctor.  Avoid being around things that make your breathing worse. This may include smoke, chemicals, and fumes.  Stay active, but remember to rest as well.  Learn and use tips on how to relax.  Make sure you get enough sleep. Most adults need at least 7 hours of sleep every night.  Eat healthy foods. Eat smaller meals more often. Rest before meals. Controlled breathing Learn and use tips on how to control your breathing as told by your doctor. Try:  Breathing in (inhaling) through your nose for 1 second. Then, pucker your lips and breath out (exhale) through your lips for 2 seconds.  Putting one hand on your belly (abdomen). Breathe in slowly through your nose for 1 second. Your hand on your belly should move out. Pucker your lips and breathe out slowly through your lips. Your hand on your belly should move in as you breathe out.  Controlled coughing Learn and use controlled coughing to clear mucus from your lungs. Follow these steps: 1. Lean your head a little forward. 2. Breathe in deeply. 3. Try to hold your breath for 3 seconds. 4. Keep your mouth slightly open while coughing 2 times. 5. Spit any mucus out into a tissue. 6. Rest and do the steps again 1 or 2 times as needed. General instructions  Make sure you get all the shots (vaccines) that your doctor recommends.  Ask your doctor about a flu shot and a pneumonia shot.  Use oxygen therapy and pulmonary rehabilitation if told by your doctor. If you need home oxygen therapy, ask your doctor if you should buy a tool to measure your oxygen level (oximeter).  Make a COPD action plan with your doctor. This helps you to know what to do if you feel worse than usual.  Manage any other conditions you have as told by your doctor.  Avoid going outside when it is very hot, cold, or humid.  Avoid people who have  a sickness you can catch (contagious).  Keep all follow-up visits as told by your doctor. This is important. Contact a doctor if:  You cough up more mucus than usual.  There is a change in the color or thickness of the mucus.  It is harder to breathe than usual.  Your breathing is faster than usual.  You have trouble sleeping.  You need to use your medicines more often than usual.  You have trouble doing your normal activities such as getting dressed or walking around the house. Get help right away if:  You have shortness of breath while resting.  You have shortness of breath that stops you from: ? Being able to talk. ? Doing normal activities.  Your chest hurts for longer than 5 minutes.  Your skin color is more blue than usual.  Your pulse oximeter shows that you have low oxygen for longer than 5 minutes.  You have a fever.  You feel too tired to breathe normally. Summary  Chronic obstructive pulmonary disease (COPD) is a long-term lung problem.  The way your lungs work will never return to normal. Usually the condition gets worse over time. There are things you can do to keep yourself as healthy as possible.  Take over-the-counter and prescription medicines only as told by your doctor.  If you smoke, stop. Smoking makes the problem worse. This information is not intended to replace advice given to you by your health care provider. Make sure you discuss any questions you have with your health care provider. Document Released: 03/09/2008 Document Revised: 09/03/2017 Document Reviewed: 10/26/2016 Elsevier Patient Education  2020 [employer].  Insomnia Insomnia is a sleep disorder that makes it difficult to fall asleep or stay asleep. Insomnia can cause fatigue, low energy, difficulty concentrating, mood swings, and poor performance at work or school. There are three different ways to classify insomnia:  Difficulty falling asleep.  Difficulty staying  asleep.  Waking up too early in the morning. Any type of insomnia can be long-term (chronic) or short-term (acute). Both are common. Short-term insomnia usually lasts for three months or less. Chronic insomnia occurs at least three times a week for longer than three months. What are the causes? Insomnia may be caused by another condition, situation, or substance, such as:  Anxiety.  Certain medicines.  Gastroesophageal reflux disease (GERD) or other gastrointestinal conditions.  Asthma or other breathing conditions.  Restless legs syndrome, sleep apnea, or other sleep disorders.  Chronic pain.  Menopause.  Stroke.  Abuse of alcohol, tobacco, or illegal drugs.  Mental health conditions, such as depression.  Caffeine.  Neurological disorders, such as Alzheimer's disease.  An overactive thyroid (hyperthyroidism). Sometimes, the cause of insomnia may not be known. What increases the risk? Risk factors for insomnia include:  Gender. Women are affected more often than men.  Age. Insomnia is more common as you get older.  Stress.  Lack of exercise.  Irregular  work schedule or working night shifts.  Traveling between different time zones.  Certain medical and mental health conditions. What are the signs or symptoms? If you have insomnia, the main symptom is having trouble falling asleep or having trouble staying asleep. This may lead to other symptoms, such as:  Feeling fatigued or having low energy.  Feeling nervous about going to sleep.  Not feeling rested in the morning.  Having trouble concentrating.  Feeling irritable, anxious, or depressed. How is this diagnosed? This condition may be diagnosed based on:  Your symptoms and medical history. Your health care provider may ask about: ? Your sleep habits. ? Any medical conditions you have. ? Your mental health.  A physical exam. How is this treated? Treatment for insomnia depends on the cause. Treatment  may focus on treating an underlying condition that is causing insomnia. Treatment may also include:  Medicines to help you sleep.  Counseling or therapy.  Lifestyle adjustments to help you sleep better. Follow these instructions at home: Eating and drinking   Limit or avoid alcohol, caffeinated beverages, and cigarettes, especially close to bedtime. These can disrupt your sleep.  Do not eat a large meal or eat spicy foods right before bedtime. This can lead to digestive discomfort that can make it hard for you to sleep. Sleep habits   Keep a sleep diary to help you and your health care provider figure out what could be causing your insomnia. Write down: ? When you sleep. ? When you wake up during the night. ? How well you sleep. ? How rested you feel the next day. ? Any side effects of medicines you are taking. ? What you eat and drink.  Make your bedroom a dark, comfortable place where it is easy to fall asleep. ? Put up shades or blackout curtains to block light from outside. ? Use a white noise machine to block noise. ? Keep the temperature cool.  Limit screen use before bedtime. This includes: ? Watching TV. ? Using your smartphone, tablet, or computer.  Stick to a routine that includes going to bed and waking up at the same times every day and night. This can help you fall asleep faster. Consider making a quiet activity, such as reading, part of your nighttime routine.  Try to avoid taking naps during the day so that you sleep better at night.  Get out of bed if you are still awake after 15 minutes of trying to sleep. Keep the lights down, but try reading or doing a quiet activity. When you feel sleepy, go back to bed. General instructions  Take over-the-counter and prescription medicines only as told by your health care provider.  Exercise regularly, as told by your health care provider. Avoid exercise starting several hours before bedtime.  Use relaxation  techniques to manage stress. Ask your health care provider to suggest some techniques that may work well for you. These may include: ? Breathing exercises. ? Routines to release muscle tension. ? Visualizing peaceful scenes.  Make sure that you drive carefully. Avoid driving if you feel very sleepy.  Keep all follow-up visits as told by your health care provider. This is important. Contact a health care provider if:  You are tired throughout the day.  You have trouble in your daily routine due to sleepiness.  You continue to have sleep problems, or your sleep problems get worse. Get help right away if:  You have serious thoughts about hurting yourself or someone else. If you  ever feel like you may hurt yourself or others, or have thoughts about taking your own life, get help right away. You can go to your nearest emergency department or call:  Your local emergency services (911 in the [place]  A suicide crisis helpline, such as the [facility] at 712-428-9656. This is open 24 hours a day. Summary  Insomnia is a sleep disorder that makes it difficult to fall asleep or stay asleep.  Insomnia can be long-term (chronic) or short-term (acute).  Treatment for insomnia depends on the cause. Treatment may focus on treating an underlying condition that is causing insomnia.  Keep a sleep diary to help you and your health care provider figure out what could be causing your insomnia. This information is not intended to replace advice given to you by your health care provider. Make sure you discuss any questions you have with your health care provider. Document Released: 09/18/2000 Document Revised: 09/03/2017 Document Reviewed: 07/01/2017 Elsevier Patient Education  2020 [employer].

## 2019-07-17 NOTE — Progress Notes (Signed)
Kevin Shepard 73 y.o.   Chief Complaint  Patient presents with   Shortness of Breath    per patient for a few weeks   Chest Pain    with deep breaths    HISTORY OF PRESENT ILLNESS: This is a 73 y.o. male with history of COPD, presently on no medications, ex smoker, complaining of dyspnea on exertion and right-sided anterior pleuritic chest pain for the past several weeks.  No other significant symptoms. Denies fever or chills.  Denies flulike symptoms.  Denies cough.  Denies increased phlegm.  HPI   Prior to Admission medications   Medication Sig Start Date End Date Taking? Authorizing Provider  acyclovir (ZOVIRAX) 200 MG capsule Take 1 capsule (200 mg total) by mouth 4 (four) times daily for 7 days. At time of flare-up. 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   Chronic bilateral low back pain without sciatica 01/10/2018   History of tobacco use 01/10/2018   History of back surgery 01/10/2018    Past Medical History:  Diagnosis Date   COPD (chronic obstructive pulmonary disease) ([place])    Hyperlipidemia     Past Surgical History:  Procedure Laterality Date   APPENDECTOMY     per patient 89's   CESAREAN SECTION  03/2017   FRACTURE SURGERY  1974   right clavicle   LUMBAR SPINE SURGERY  2018   SPINE SURGERY  10/2016   TONSILLECTOMY      Social History   Socioeconomic History   Marital status: Married    Spouse name: Not on file   Number of children: Not on file   Years of education: Not on file   Highest education level: Not on file  Occupational History   Occupation: stock market trading  Scientist, product/process development strain: Not on file   Food insecurity    Worry: Not on  file    Inability: Not on file   Transportation needs    Medical: Not on file    Non-medical: Not on file  Tobacco Use   Smoking status: Former Smoker    Packs/day: 1.00    Years: 40.00    Pack years: 40.00    Quit date: 10/04/2017    Years since quitting: 1.7   Smokeless tobacco: Never Used  Substance and Sexual Activity   Alcohol use: Yes   Drug use: Never   Sexual activity: Yes    Partners: Female  Lifestyle   Physical activity    Days per week: Not on file    Minutes per session: Not on file   Stress: Not on file  Relationships   Social connections    Talks on phone: Not on file    Gets together: Not on file    Attends religious service: Not on file    Active member of club or organization: Not on file    Attends meetings of clubs or organizations: Not on file    Relationship status: Not on file   Intimate partner violence    Fear of current or ex partner: Not on file    Emotionally abused: Not on file    Physically abused: Not on file    Forced sexual activity:  Not on file  Other Topics Concern   Not on file  Social History Narrative   Not on file    Family History  Problem Relation Age of Onset   Cancer Mother    Early death Father    Heart disease Father 28       rheumatic heart disease   Hyperlipidemia Sister      Review of Systems  Constitutional: Negative.  Negative for chills and fever.  HENT: Negative.  Negative for congestion and sore throat.   Respiratory: Positive for shortness of breath (Dyspnea on exertion). Negative for cough, hemoptysis, sputum production and wheezing.   Cardiovascular: Positive for chest pain (Right-sided pleuritic pain). Negative for palpitations.  Gastrointestinal: Negative.  Negative for abdominal pain, diarrhea, nausea and vomiting.  Genitourinary: Negative.   Musculoskeletal: Negative for myalgias.  Skin: Negative.  Negative for rash.  Neurological: Negative for dizziness and headaches.    Endo/Heme/Allergies: Negative.   Psychiatric/Behavioral: The patient has insomnia (Has taken Lunesta before with success, requesting prescription).   All other systems reviewed and are negative.     Today's Vitals   07/17/19 1444  BP: 124/73  Pulse: 76  Resp: 16  Temp: 98.8 F (37.1 C)  TempSrc: Oral  SpO2: 95%  Weight: 169 lb 3.2 oz (76.7 kg)  Height: 5\' 9"  (1.753 m)   Body mass index is 24.99 kg/m.  Physical Exam Vitals signs reviewed.  Constitutional:      Appearance: He is well-developed.  HENT:     Head: Normocephalic.  Eyes:     Extraocular Movements: Extraocular movements intact.     Pupils: Pupils are equal, round, and reactive to light.  Neck:     Musculoskeletal: Normal range of motion and neck supple.  Cardiovascular:     Rate and Rhythm: Normal rate and regular rhythm.     Pulses: Normal pulses.     Heart sounds: Normal heart sounds.  Pulmonary:     Effort: Pulmonary effort is normal.     Breath sounds: Normal breath sounds.  Musculoskeletal: Normal range of motion.     Right lower leg: No edema.     Left lower leg: No edema.  Skin:    General: Skin is warm and dry.     Capillary Refill: Capillary refill takes less than 2 seconds.  Neurological:     General: No focal deficit present.     Mental Status: He is alert and oriented to person, place, and time.  Psychiatric:        Mood and Affect: Mood normal.        Behavior: Behavior normal.    IMPRESSION: 1. Lung-RADS 4A, suspicious. Follow up low-dose chest CT without contrast in 3 months (please use the following order, "CT CHEST LCS NODULE FOLLOW-UP W/O CM") is recommended. Alternatively, PET may be considered when there is a solid component 71mm or larger. 2. Aortic Atherosclerosis (ICD10-I70.0) and Emphysema (ICD10-J43.9). 3. LAD coronary artery atherosclerotic calcifications.   Electronically Signed   By: Kerby Moors M.D.   On: 05/13/2018 11:16   Dg Chest 2 View  Result Date:  07/17/2019 CLINICAL DATA:  Shortness on breath and right-sided chest pain EXAM: CHEST - 2 VIEW COMPARISON:  Chest CT May 12, 2018. FINDINGS: There is scarring in the left base, stable. Lungs are mildly hyperexpanded. There is no demonstrable edema or consolidation. Heart size and pulmonary vascularity are normal. No adenopathy. There is aortic atherosclerosis. There is evidence of old trauma involving the right  clavicle. IMPRESSION: Scarring left base. Lungs mildly hyperexpanded. No edema or consolidation. Heart size normal. No adenopathy. Aortic Atherosclerosis (ICD10-I70.0). Electronically Signed   By: Lowella Grip III M.D.   On: 07/17/2019 15:00    ASSESSMENT & PLAN: Kaylee was seen today for shortness of breath and chest pain.  Diagnoses and all orders for this visit:  Anterior pleuritic pain -     DG Chest 2 View; Future  Dyspnea on exertion -     DG Chest 2 View; Future -     budesonide-formoterol (SYMBICORT) 80-4.5 MCG/ACT inhaler; Inhale 2 puffs into the lungs 2 (two) times daily. -     Cancel: CBC with Differential/Platelet -     Cancel: Comprehensive metabolic panel -     Comprehensive metabolic panel; Future -     CBC with Differential/Platelet; Future  Pulmonary emphysema, unspecified emphysema type (HCC) -     budesonide-formoterol (SYMBICORT) 80-4.5 MCG/ACT inhaler; Inhale 2 puffs into the lungs 2 (two) times daily.  Atherosclerosis of aorta (HCC) -     Cancel: Lipid panel -     Lipid panel; Future  Insomnia, unspecified type -     eszopiclone (LUNESTA) 2 MG TABS tablet; Take 1 tablet (2 mg total) by mouth at bedtime as needed for sleep. Take immediately before bedtime  Prostate cancer screening    Patient Instructions       If you have lab work done today you will be contacted with your lab results within the next 2 weeks.  If you have not heard from [place] then please contact us. The fastest way to get your results is to register for My Chart.   IF you  received an x-ray today, you will receive an invoice from [facility]. Please contact [facility] at [phone] with questions or concerns regarding your invoice.   IF you received labwork today, you will receive an invoice from [facility]. Please contact [facility] at [phone] with questions or concerns regarding your invoice.   Our billing staff will not be able to assist you with questions regarding bills from these companies.  You will be contacted with the lab results as soon as they are available. The fastest way to get your results is to activate your My Chart account. Instructions are located on the last page of this paperwork. If you have not heard from [place] regarding the results in 2 weeks, please contact this office.      Chronic Obstructive Pulmonary Disease Chronic obstructive pulmonary disease (COPD) is a long-term (chronic) lung problem. When you have COPD, it is hard for air to get in and out of your lungs. Usually the condition gets worse over time, and your lungs will never return to normal. There are things you can do to keep yourself as healthy as possible.  Your doctor may treat your condition with: ? Medicines. ? Oxygen. ? Lung surgery.  Your doctor may also recommend: ? Rehabilitation. This includes steps to make your body work better. It may involve a team of specialists. ? Quitting smoking, if you smoke. ? Exercise and changes to your diet. ? Comfort measures (palliative care). Follow these instructions at home: Medicines  Take over-the-counter and prescription medicines only as told by your doctor.  Talk to your doctor before taking any cough or allergy medicines. You may need to avoid medicines that cause your lungs to be dry. Lifestyle  If you smoke, stop. Smoking makes the problem worse. If you need help quitting, ask your doctor.  Avoid being around things that make your breathing worse. This may include smoke, chemicals, and  fumes.  Stay active, but remember to rest as well.  Learn and use tips on how to relax.  Make sure you get enough sleep. Most adults need at least 7 hours of sleep every night.  Eat healthy foods. Eat smaller meals more often. Rest before meals. Controlled breathing Learn and use tips on how to control your breathing as told by your doctor. Try:  Breathing in (inhaling) through your nose for 1 second. Then, pucker your lips and breath out (exhale) through your lips for 2 seconds.  Putting one hand on your belly (abdomen). Breathe in slowly through your nose for 1 second. Your hand on your belly should move out. Pucker your lips and breathe out slowly through your lips. Your hand on your belly should move in as you breathe out.  Controlled coughing Learn and use controlled coughing to clear mucus from your lungs. Follow these steps: 1. Lean your head a little forward. 2. Breathe in deeply. 3. Try to hold your breath for 3 seconds. 4. Keep your mouth slightly open while coughing 2 times. 5. Spit any mucus out into a tissue. 6. Rest and do the steps again 1 or 2 times as needed. General instructions  Make sure you get all the shots (vaccines) that your doctor recommends. Ask your doctor about a flu shot and a pneumonia shot.  Use oxygen therapy and pulmonary rehabilitation if told by your doctor. If you need home oxygen therapy, ask your doctor if you should buy a tool to measure your oxygen level (oximeter).  Make a COPD action plan with your doctor. This helps you to know what to do if you feel worse than usual.  Manage any other conditions you have as told by your doctor.  Avoid going outside when it is very hot, cold, or humid.  Avoid people who have a sickness you can catch (contagious).  Keep all follow-up visits as told by your doctor. This is important. Contact a doctor if:  You cough up more mucus than usual.  There is a change in the color or thickness of the  mucus.  It is harder to breathe than usual.  Your breathing is faster than usual.  You have trouble sleeping.  You need to use your medicines more often than usual.  You have trouble doing your normal activities such as getting dressed or walking around the house. Get help right away if:  You have shortness of breath while resting.  You have shortness of breath that stops you from: ? Being able to talk. ? Doing normal activities.  Your chest hurts for longer than 5 minutes.  Your skin color is more blue than usual.  Your pulse oximeter shows that you have low oxygen for longer than 5 minutes.  You have a fever.  You feel too tired to breathe normally. Summary  Chronic obstructive pulmonary disease (COPD) is a long-term lung problem.  The way your lungs work will never return to normal. Usually the condition gets worse over time. There are things you can do to keep yourself as healthy as possible.  Take over-the-counter and prescription medicines only as told by your doctor.  If you smoke, stop. Smoking makes the problem worse. This information is not intended to replace advice given to you by your health care provider. Make sure you discuss any questions you have with your health care provider. Document Released:  03/09/2008 Document Revised: 09/03/2017 Document Reviewed: 10/26/2016 Elsevier Patient Education  [place].  Insomnia Insomnia is a sleep disorder that makes it difficult to fall asleep or stay asleep. Insomnia can cause fatigue, low energy, difficulty concentrating, mood swings, and poor performance at work or school. There are three different ways to classify insomnia:  Difficulty falling asleep.  Difficulty staying asleep.  Waking up too early in the morning. Any type of insomnia can be long-term (chronic) or short-term (acute). Both are common. Short-term insomnia usually lasts for three months or less. Chronic insomnia occurs at least three  times a week for longer than three months. What are the causes? Insomnia may be caused by another condition, situation, or substance, such as:  Anxiety.  Certain medicines.  Gastroesophageal reflux disease (GERD) or other gastrointestinal conditions.  Asthma or other breathing conditions.  Restless legs syndrome, sleep apnea, or other sleep disorders.  Chronic pain.  Menopause.  Stroke.  Abuse of alcohol, tobacco, or illegal drugs.  Mental health conditions, such as depression.  Caffeine.  Neurological disorders, such as Alzheimer's disease.  An overactive thyroid (hyperthyroidism). Sometimes, the cause of insomnia may not be known. What increases the risk? Risk factors for insomnia include:  Gender. Women are affected more often than men.  Age. Insomnia is more common as you get older.  Stress.  Lack of exercise.  Irregular work schedule or working night shifts.  Traveling between different time zones.  Certain medical and mental health conditions. What are the signs or symptoms? If you have insomnia, the main symptom is having trouble falling asleep or having trouble staying asleep. This may lead to other symptoms, such as:  Feeling fatigued or having low energy.  Feeling nervous about going to sleep.  Not feeling rested in the morning.  Having trouble concentrating.  Feeling irritable, anxious, or depressed. How is this diagnosed? This condition may be diagnosed based on:  Your symptoms and medical history. Your health care provider may ask about: ? Your sleep habits. ? Any medical conditions you have. ? Your mental health.  A physical exam. How is this treated? Treatment for insomnia depends on the cause. Treatment may focus on treating an underlying condition that is causing insomnia. Treatment may also include:  Medicines to help you sleep.  Counseling or therapy.  Lifestyle adjustments to help you sleep better. Follow these  instructions at home: Eating and drinking   Limit or avoid alcohol, caffeinated beverages, and cigarettes, especially close to bedtime. These can disrupt your sleep.  Do not eat a large meal or eat spicy foods right before bedtime. This can lead to digestive discomfort that can make it hard for you to sleep. Sleep habits   Keep a sleep diary to help you and your health care provider figure out what could be causing your insomnia. Write down: ? When you sleep. ? When you wake up during the night. ? How well you sleep. ? How rested you feel the next day. ? Any side effects of medicines you are taking. ? What you eat and drink.  Make your bedroom a dark, comfortable place where it is easy to fall asleep. ? Put up shades or blackout curtains to block light from outside. ? Use a white noise machine to block noise. ? Keep the temperature cool.  Limit screen use before bedtime. This includes: ? Watching TV. ? Using your smartphone, tablet, or computer.  Stick to a routine that includes going to bed and waking up  at the same times every day and night. This can help you fall asleep faster. Consider making a quiet activity, such as reading, part of your nighttime routine.  Try to avoid taking naps during the day so that you sleep better at night.  Get out of bed if you are still awake after 15 minutes of trying to sleep. Keep the lights down, but try reading or doing a quiet activity. When you feel sleepy, go back to bed. General instructions  Take over-the-counter and prescription medicines only as told by your health care provider.  Exercise regularly, as told by your health care provider. Avoid exercise starting several hours before bedtime.  Use relaxation techniques to manage stress. Ask your health care provider to suggest some techniques that may work well for you. These may include: ? Breathing exercises. ? Routines to release muscle tension. ? Visualizing peaceful  scenes.  Make sure that you drive carefully. Avoid driving if you feel very sleepy.  Keep all follow-up visits as told by your health care provider. This is important. Contact a health care provider if:  You are tired throughout the day.  You have trouble in your daily routine due to sleepiness.  You continue to have sleep problems, or your sleep problems get worse. Get help right away if:  You have serious thoughts about hurting yourself or someone else. If you ever feel like you may hurt yourself or others, or have thoughts about taking your own life, get help right away. You can go to your nearest emergency department or call:  Your local emergency services (911 in the [place]  A suicide crisis helpline, such as the [facility] at 5805047435. This is open 24 hours a day. Summary  Insomnia is a sleep disorder that makes it difficult to fall asleep or stay asleep.  Insomnia can be long-term (chronic) or short-term (acute).  Treatment for insomnia depends on the cause. Treatment may focus on treating an underlying condition that is causing insomnia.  Keep a sleep diary to help you and your health care provider figure out what could be causing your insomnia. This information is not intended to replace advice given to you by your health care provider. Make sure you discuss any questions you have with your health care provider. Document Released: 09/18/2000 Document Revised: 09/03/2017 Document Reviewed: 07/01/2017 Elsevier Patient Education  2020 Elsevier Inc.      Agustina Caroli, MD [facility]

## 2019-07-18 ENCOUNTER — Ambulatory Visit (INDEPENDENT_AMBULATORY_CARE_PROVIDER_SITE_OTHER): Payer: Medicare Other | Admitting: Emergency Medicine

## 2019-07-18 DIAGNOSIS — R0600 Dyspnea, unspecified: Secondary | ICD-10-CM

## 2019-07-18 DIAGNOSIS — I700 Atherosclerosis of aorta: Secondary | ICD-10-CM

## 2019-07-18 DIAGNOSIS — R0609 Other forms of dyspnea: Secondary | ICD-10-CM

## 2019-07-18 NOTE — Progress Notes (Signed)
Nurse visit for labs only

## 2019-07-19 ENCOUNTER — Encounter: Payer: Self-pay | Admitting: Emergency Medicine

## 2019-07-19 ENCOUNTER — Other Ambulatory Visit: Payer: Self-pay | Admitting: Emergency Medicine

## 2019-07-19 DIAGNOSIS — J449 Chronic obstructive pulmonary disease, unspecified: Secondary | ICD-10-CM

## 2019-07-19 LAB — CBC WITH DIFFERENTIAL/PLATELET
Basophils Absolute: 0 10*3/uL (ref 0.0–0.2)
Basos: 1 %
EOS (ABSOLUTE): 0.3 10*3/uL (ref 0.0–0.4)
Eos: 4 %
Hematocrit: 40.8 % (ref 37.5–51.0)
Hemoglobin: 13.6 g/dL (ref 13.0–17.7)
Immature Grans (Abs): 0 10*3/uL (ref 0.0–0.1)
Immature Granulocytes: 0 %
Lymphocytes Absolute: 2.2 10*3/uL (ref 0.7–3.1)
Lymphs: 37 %
MCH: 30.6 pg (ref 26.6–33.0)
MCHC: 33.3 g/dL (ref 31.5–35.7)
MCV: 92 fL (ref 79–97)
Monocytes Absolute: 0.6 10*3/uL (ref 0.1–0.9)
Monocytes: 10 %
Neutrophils Absolute: 2.8 10*3/uL (ref 1.4–7.0)
Neutrophils: 48 %
Platelets: 295 10*3/uL (ref 150–450)
RBC: 4.44 x10E6/uL (ref 4.14–5.80)
RDW: 12.9 % (ref 11.6–15.4)
WBC: 5.8 10*3/uL (ref 3.4–10.8)

## 2019-07-19 LAB — COMPREHENSIVE METABOLIC PANEL
ALT: 18 IU/L (ref 0–44)
AST: 18 IU/L (ref 0–40)
Albumin/Globulin Ratio: 1.8 (ref 1.2–2.2)
Albumin: 4.6 g/dL (ref 3.7–4.7)
Alkaline Phosphatase: 50 IU/L (ref 39–117)
BUN/Creatinine Ratio: 13 (ref 10–24)
BUN: 14 mg/dL (ref 8–27)
Bilirubin Total: 0.5 mg/dL (ref 0.0–1.2)
CO2: 23 mmol/L (ref 20–29)
Calcium: 9.5 mg/dL (ref 8.6–10.2)
Chloride: 104 mmol/L (ref 96–106)
Creatinine, Ser: 1.06 mg/dL (ref 0.76–1.27)
GFR calc Af Amer: 81 mL/min/{1.73_m2} (ref >59)
GFR calc non Af Amer: 70 mL/min/{1.73_m2} (ref >59)
Globulin, Total: 2.6 g/dL (ref 1.5–4.5)
Glucose: 91 mg/dL (ref 65–99)
Potassium: 4.7 mmol/L (ref 3.5–5.2)
Sodium: 139 mmol/L (ref 134–144)
Total Protein: 7.2 g/dL (ref 6.0–8.5)

## 2019-07-19 LAB — LIPID PANEL
Chol/HDL Ratio: 3.6 ratio (ref 0.0–5.0)
Cholesterol, Total: 228 mg/dL — ABNORMAL HIGH (ref 100–199)
HDL: 63 mg/dL (ref >39)
LDL Chol Calc (NIH): 145 mg/dL — ABNORMAL HIGH (ref 0–99)
Triglycerides: 116 mg/dL (ref 0–149)
VLDL Cholesterol Cal: 20 mg/dL (ref 5–40)

## 2019-07-19 MED ORDER — ROSUVASTATIN CALCIUM 10 MG PO TABS: 10.0000 mg | ORAL_TABLET | Freq: Every day | ORAL | 3 refills | Status: DC

## 2019-07-19 MED ORDER — DULERA 100-5 MCG/ACT IN AERO: 2.0000 | INHALATION_SPRAY | Freq: Two times a day (BID) | RESPIRATORY_TRACT | 7 refills | Status: DC

## 2019-07-19 NOTE — Telephone Encounter (Signed)
I will prescribe Dulera instead of Symbicort but if this is not covered by insurance then pharmacy has to tell us what is covered and give [place] options.  If patient wants to try diet instead of medication for his cholesterol that is his prerogative.  The lipid profile results are on MyChart. Thanks.

## 2019-07-19 NOTE — Progress Notes (Unsigned)
Continue with formal

## 2019-08-03 ENCOUNTER — Other Ambulatory Visit: Payer: Self-pay | Admitting: Emergency Medicine

## 2019-08-03 DIAGNOSIS — J449 Chronic obstructive pulmonary disease, unspecified: Secondary | ICD-10-CM

## 2019-08-03 MED ORDER — FLUTICASONE FUROATE-VILANTEROL 200-25 MCG/INH IN AEPB: 1.0000 | INHALATION_SPRAY | Freq: Every day | RESPIRATORY_TRACT | 5 refills | Status: DC

## 2019-08-03 NOTE — Telephone Encounter (Signed)
Prescription for [facility] sent in.  Thanks.

## 2019-08-21 DIAGNOSIS — Z20828 Contact with and (suspected) exposure to other viral communicable diseases: Secondary | ICD-10-CM | POA: Diagnosis not present

## 2019-09-04 ENCOUNTER — Encounter: Payer: Self-pay | Admitting: Emergency Medicine

## 2019-09-20 ENCOUNTER — Other Ambulatory Visit: Payer: Self-pay | Admitting: *Deleted

## 2019-09-20 DIAGNOSIS — Z129 Encounter for screening for malignant neoplasm, site unspecified: Secondary | ICD-10-CM

## 2019-09-20 NOTE — Telephone Encounter (Signed)
This test is recommended.

## 2019-10-15 ENCOUNTER — Other Ambulatory Visit: Payer: Self-pay | Admitting: Emergency Medicine

## 2019-10-15 DIAGNOSIS — G4700 Insomnia, unspecified: Secondary | ICD-10-CM

## 2019-10-15 NOTE — Telephone Encounter (Signed)
Requested medication (s) are due for refill today:yes  Requested medication (s) are on the active medication list: yes  Last refill: 07/17/2019  #30  1 refill  Future visit scheduled yes  Notes to clinic:not delegated  Requested Prescriptions  Pending Prescriptions Disp Refills   eszopiclone (LUNESTA) 2 MG TABS tablet [Pharmacy Med Name: ESZOPICLONE 2MG  TABLETS] 30 tablet     Sig: TAKE 1 TABLET BY MOUTH AT BEDTIME AS NEEDED FOR SLEEP. TAKE IMMEDIATELY BEFORE BEDTIME.      Not Delegated - Psychiatry:  Anxiolytics/Hypnotics Failed - 10/15/2019  3:32 AM      Failed - This refill cannot be delegated      Failed - Urine Drug Screen completed in last 360 days.      Passed - Valid encounter within last 6 months    Recent Outpatient Visits           2 months ago Dyspnea on exertion   Primary Care at [facility], [place], MD   3 months ago Anterior pleuritic pain   Primary Care at [facility], Ines Bloomer, MD   1 year ago Cough   Primary Care at [facility], Ines Bloomer, MD   1 year ago Achilles tendinitis of right lower extremity   Primary Care at [facility], Ines Bloomer, MD   1 year ago Rectal bleeding   Primary Care at [facility], Ines Bloomer, MD       Future Appointments             In 3 months Sagardia, Ines Bloomer, MD Primary Care at [place], [facility]

## 2019-10-16 NOTE — Telephone Encounter (Signed)
Patient is requesting a refill of the following medications: Requested Prescriptions   Pending Prescriptions Disp Refills   eszopiclone (LUNESTA) 2 MG TABS tablet [Pharmacy Med Name: ESZOPICLONE 2MG  TABLETS] 30 tablet     Sig: TAKE 1 TABLET BY MOUTH AT BEDTIME AS NEEDED FOR SLEEP. TAKE IMMEDIATELY BEFORE BEDTIME.    Date of patient request:10/15/19 Last office visit: 07/17/19 Date of last refill: 07/17/19 Last refill amount: 30-1rf Follow up time period per chart: 01/18/20

## 2019-10-18 ENCOUNTER — Ambulatory Visit (INDEPENDENT_AMBULATORY_CARE_PROVIDER_SITE_OTHER): Payer: Medicare Other | Admitting: Emergency Medicine

## 2019-10-18 ENCOUNTER — Encounter: Payer: Self-pay | Admitting: Emergency Medicine

## 2019-10-18 ENCOUNTER — Other Ambulatory Visit: Payer: Self-pay

## 2019-10-18 VITALS — BP 144/66 | HR 65 | Temp 98.3°F | Resp 16 | Ht 69.0 in | Wt 168.0 lb

## 2019-10-18 DIAGNOSIS — L290 Pruritus ani: Secondary | ICD-10-CM | POA: Diagnosis not present

## 2019-10-18 DIAGNOSIS — J449 Chronic obstructive pulmonary disease, unspecified: Secondary | ICD-10-CM

## 2019-10-18 DIAGNOSIS — K602 Anal fissure, unspecified: Secondary | ICD-10-CM | POA: Diagnosis not present

## 2019-10-18 DIAGNOSIS — J439 Emphysema, unspecified: Secondary | ICD-10-CM

## 2019-10-18 DIAGNOSIS — Z122 Encounter for screening for malignant neoplasm of respiratory organs: Secondary | ICD-10-CM

## 2019-10-18 DIAGNOSIS — Z87891 Personal history of nicotine dependence: Secondary | ICD-10-CM | POA: Diagnosis not present

## 2019-10-18 DIAGNOSIS — I700 Atherosclerosis of aorta: Secondary | ICD-10-CM

## 2019-10-18 MED ORDER — HYDROCORTISONE (PERIANAL) 2.5 % EX CREA: 1.0000 "application " | TOPICAL_CREAM | Freq: Two times a day (BID) | CUTANEOUS | 0 refills | Status: DC

## 2019-10-18 NOTE — Patient Instructions (Addendum)
If you have lab work done today you will be contacted with your lab results within the next 2 weeks.  If you have not heard from [place] then please contact us. The fastest way to get your results is to register for My Chart.   IF you received an x-ray today, you will receive an invoice from [facility]. Please contact [facility] at [phone] with questions or concerns regarding your invoice.   IF you received labwork today, you will receive an invoice from [place]. Please contact [facility] at [phone] with questions or concerns regarding your invoice.   Our billing staff will not be able to assist you with questions regarding bills from these companies.  You will be contacted with the lab results as soon as they are available. The fastest way to get your results is to activate your My Chart account. Instructions are located on the last page of this paperwork. If you have not heard from [place] regarding the results in 2 weeks, please contact this office.     Anal Fissure, Adult  An anal fissure is a small tear or crack in the tissue around the opening of the butt (anus). Bleeding from the tear or crack usually stops on its own within a few minutes. The bleeding may happen every time you poop (have a bowel movement) until the tear or crack heals. What are the causes? This condition is usually caused by passing a large or hard poop (stool). Other causes include:  Trouble pooping (constipation).  Passing watery poop (diarrhea).  Inflammatory bowel disease (Crohn's disease or ulcerative colitis).  Childbirth.  Infections.  Anal sex. What are the signs or symptoms? Symptoms of this condition include:  Bleeding from the butt.  Small amounts of blood on your poop. The blood coats the outside of the poop. It is not mixed with the poop.  Small amounts of blood on the toilet paper or in the toilet after you poop.  Pain when passing poop.  Itching or irritation  around the opening of the butt. How is this diagnosed? This condition may be diagnosed based on a physical exam. Your doctor may:  Check your butt. A tear can often be seen by checking the area with care.  Check your butt using a short tube (anoscope). The light in the tube will show any problems in your butt. How is this treated? Treatment for this condition may include:  Treating problems that make it hard for you to pass poop. You may be told to: ? Eat more fiber. ? Drink more fluid. ? Take fiber supplements. ? Take medicines that make poop soft.  Taking sitz baths. This may help to heal the tear.  Using creams and ointments. If your condition gets worse, other treatments may be needed such as:  A shot near the tear or crack (botulinum injection).  Surgery to repair the tear or crack. Follow these instructions at home: Eating and drinking   Avoid bananas and dairy products. These foods can make it hard to poop.  Drink enough fluid to keep your pee (urine) pale yellow.  Eat foods that have a lot of fiber in them, such as: ? Beans. ? Whole grains. ? Fresh fruits. ? Fresh vegetables. General instructions   Take over-the-counter and prescription medicines only as told by your doctor.  Use creams or ointments only as told by your doctor.  Keep the butt area as clean and dry as you can.  Take a warm  water bath (sitz bath) as told by your doctor. Do not use soap.  Keep all follow-up visits as told by your doctor. This is important. Contact a doctor if:  You have more bleeding.  You have a fever.  You have watery poop that is mixed with blood.  You have pain.  Your problem gets worse, not better. Summary  An anal fissure is a small tear or crack in the skin around the opening of the butt (anus).  This condition is usually caused by passing a large or hard poop (stool).  Treatment includes treating the problems that make it hard for you to pass  poop.  Follow your doctor's instructions about caring for your condition at home.  Keep all follow-up visits as told by your doctor. This is important. This information is not intended to replace advice given to you by your health care provider. Make sure you discuss any questions you have with your health care provider. Document Revised: 03/03/2018 Document Reviewed: 03/03/2018 Elsevier Patient Education  [place].

## 2019-10-18 NOTE — Progress Notes (Signed)
Kevin Shepard 74 y.o.   Chief Complaint  Patient presents with  . Rectal Problems    with itching x 2 weeks  . Referral    CT SCAN for SOB    HISTORY OF PRESENT ILLNESS: This is a 74 y.o. male complaining of anal itching for 2 weeks preceded by constipation. Former smoker with history of COPD, requesting CT chest to screen for lung cancer. No other complaints or medical concerns today.  HPI   Prior to Admission medications   Medication Sig Start Date End Date Taking? Authorizing Provider  eszopiclone (LUNESTA) 2 MG TABS tablet TAKE 1 TABLET BY MOUTH AT BEDTIME AS NEEDED FOR SLEEP. TAKE IMMEDIATELY BEFORE BEDTIME. 10/17/19  Yes Juanjesus Pepperman, Ines Bloomer, MD  fluticasone furoate-vilanterol (BREO ELLIPTA) 200-25 MCG/INH AEPB Inhale 1 puff into the lungs daily. 08/03/19  Yes Kirk Basquez, Ines Bloomer, MD  acyclovir (ZOVIRAX) 200 MG capsule Take 1 capsule (200 mg total) by mouth 4 (four) times daily for 7 days. At time of flare-up. 06/07/18 06/14/18  Horald Pollen, MD  mometasone-formoterol [facility]) 100-5 MCG/ACT AERO Inhale 2 puffs into the lungs 2 (two) times daily. Patient not taking: Reported on 10/18/2019 07/19/19   Horald Pollen, MD  rosuvastatin (CRESTOR) 10 MG tablet Take 1 tablet (10 mg total) by mouth daily. Patient not taking: Reported on 10/18/2019 07/19/19   Horald Pollen, MD    Not on File  Patient Active Problem List   Diagnosis Date Noted  . COPD  GOLD II/ Group A 06/02/2018  . Left upper lobe pulmonary nodule 06/02/2018  . Former smoker 04/06/2018  . Alcohol use 01/10/2018  . 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 Chronic bilateral low back pain without sciatica 01/10/2018  . History of tobacco use 01/10/2018  . History of back surgery 01/10/2018    Past Medical History:  Diagnosis Date  . COPD (chronic obstructive pulmonary disease) ([place])   . Hyperlipidemia     Past Surgical History:  Procedure Laterality Date  . APPENDECTOMY     per  patient 1950's  . CESAREAN SECTION  03/2017  . FRACTURE SURGERY  1974   right clavicle  . LUMBAR SPINE SURGERY  2018  . SPINE SURGERY  10/2016  . TONSILLECTOMY      Social History   Socioeconomic History  . Marital status: Married    Spouse name: Not on file  . Number of children: Not on file  . Years of education: Not on file  . Highest education level: Not on file  Occupational History  . Occupation: Merchandiser, retail trading  Tobacco Use  . Smoking status: Former Smoker    Packs/day: 1.00    Years: 40.00    Pack years: 40.00    Quit date: 10/04/2017    Years since quitting: 2.0  . Smokeless tobacco: Never Used  Substance and Sexual Activity  . Alcohol use: Yes  . Drug use: Never  . Sexual activity: Yes    Partners: Female  Other Topics Concern  . Not on file  Social History Narrative  . Not on file   Social Determinants of Health   Financial Resource Strain:   . Difficulty of Paying Living Expenses: Not on file  Food Insecurity:   . Worried About Charity fundraiser in the Last Year: Not on file  . Ran Out of Food in the Last Year: Not on file  Transportation Needs:   . Lack of Transportation (Medical): Not on file  . Lack of  Transportation (Non-Medical): Not on file  Physical Activity:   . Days of Exercise per Week: Not on file  . Minutes of Exercise per Session: Not on file  Stress:   . Feeling of Stress : Not on file  Social Connections:   . Frequency of Communication with Friends and Family: Not on file  . Frequency of Social Gatherings with Friends and Family: Not on file  . Attends Religious Services: Not on file  . Active Member of Clubs or Organizations: Not on file  . Attends Archivist Meetings: Not on file  . Marital Status: Not on file  Intimate Partner Violence:   . Fear of Current or Ex-Partner: Not on file  . Emotionally Abused: Not on file  . Physically Abused: Not on file  . Sexually Abused: Not on file    Family History    Problem Relation Age of Onset  . Cancer Mother   . Early death Father   . Heart disease Father 63       rheumatic heart disease  . Hyperlipidemia Sister      Review of Systems  Constitutional: Negative.  Negative for chills and fever.  HENT: Negative.  Negative for congestion and sore throat.   Respiratory: Positive for shortness of breath. Negative for cough, hemoptysis, sputum production and wheezing.   Cardiovascular: Negative.  Negative for chest pain and palpitations.  Gastrointestinal: Negative.  Negative for abdominal pain, blood in stool, diarrhea, melena, nausea and vomiting.  Genitourinary: Negative.  Negative for dysuria.  Musculoskeletal: Negative.  Negative for back pain, myalgias and neck pain.  Skin: Negative.  Negative for rash.  Neurological: Negative.  Negative for dizziness and headaches.  All other systems reviewed and are negative.  Today's Vitals   10/18/19 1508  BP: (!) 144/66  Pulse: 65  Resp: 16  Temp: 98.3 F (36.8 C)  TempSrc: Temporal  SpO2: 98%  Weight: 168 lb (76.2 kg)  Height: 5\' 9"  (1.753 m)   Body mass index is 24.81 kg/m.   Physical Exam Vitals reviewed.  Constitutional:      Appearance: Normal appearance.  HENT:     Head: Normocephalic.  Eyes:     Extraocular Movements: Extraocular movements intact.     Conjunctiva/sclera: Conjunctivae normal.     Pupils: Pupils are equal, round, and reactive to light.  Cardiovascular:     Rate and Rhythm: Normal rate and regular rhythm.     Pulses: Normal pulses.     Heart sounds: Normal heart sounds.  Pulmonary:     Effort: Pulmonary effort is normal.     Breath sounds: Normal breath sounds.  Abdominal:     General: There is no distension.     Palpations: Abdomen is soft.     Tenderness: There is no guarding.  Genitourinary:    Prostate: Normal.     Rectum: Anal fissure present. No mass, tenderness, external hemorrhoid or internal hemorrhoid. Normal anal tone.  Musculoskeletal:      Cervical back: Normal range of motion and neck supple.  Neurological:     Mental Status: He is alert.      ASSESSMENT & PLAN: Kevin Shepard was seen today for rectal problems and referral.  Diagnoses and all orders for this visit:  COPD  GOLD II/ Group A  Pulmonary emphysema, unspecified emphysema type ([place]) -     CT CHEST LUNG CA SCREEN LOW DOSE W/O CM; Future  Atherosclerosis of aorta (HCC)  Former smoker -  CT CHEST LUNG CA SCREEN LOW DOSE W/O CM; Future  Anal fissure  Encounter for screening for lung cancer -     CT CHEST LUNG CA SCREEN LOW DOSE W/O CM; Future  Anal itching -     hydrocortisone (ANUSOL-HC) 2.5 % rectal cream; Place 1 application rectally 2 (two) times daily.    Patient Instructions       If you have lab work done today you will be contacted with your lab results within the next 2 weeks.  If you have not heard from [place] then please contact us. The fastest way to get your results is to register for My Chart.   IF you received an x-ray today, you will receive an invoice from [facility]. Please contact [facility] at [phone] with questions or concerns regarding your invoice.   IF you received labwork today, you will receive an invoice from [place]. Please contact [facility] at [phone] with questions or concerns regarding your invoice.   Our billing staff will not be able to assist you with questions regarding bills from these companies.  You will be contacted with the lab results as soon as they are available. The fastest way to get your results is to activate your My Chart account. Instructions are located on the last page of this paperwork. If you have not heard from [place] regarding the results in 2 weeks, please contact this office.     Anal Fissure, Adult  An anal fissure is a small tear or crack in the tissue around the opening of the butt (anus). Bleeding from the tear or crack usually stops on its own within a few  minutes. The bleeding may happen every time you poop (have a bowel movement) until the tear or crack heals. What are the causes? This condition is usually caused by passing a large or hard poop (stool). Other causes include:  Trouble pooping (constipation).  Passing watery poop (diarrhea).  Inflammatory bowel disease (Crohn's disease or ulcerative colitis).  Childbirth.  Infections.  Anal sex. What are the signs or symptoms? Symptoms of this condition include:  Bleeding from the butt.  Small amounts of blood on your poop. The blood coats the outside of the poop. It is not mixed with the poop.  Small amounts of blood on the toilet paper or in the toilet after you poop.  Pain when passing poop.  Itching or irritation around the opening of the butt. How is this diagnosed? This condition may be diagnosed based on a physical exam. Your doctor may:  Check your butt. A tear can often be seen by checking the area with care.  Check your butt using a short tube (anoscope). The light in the tube will show any problems in your butt. How is this treated? Treatment for this condition may include:  Treating problems that make it hard for you to pass poop. You may be told to: ? Eat more fiber. ? Drink more fluid. ? Take fiber supplements. ? Take medicines that make poop soft.  Taking sitz baths. This may help to heal the tear.  Using creams and ointments. If your condition gets worse, other treatments may be needed such as:  A shot near the tear or crack (botulinum injection).  Surgery to repair the tear or crack. Follow these instructions at home: Eating and drinking   Avoid bananas and dairy products. These foods can make it hard to poop.  Drink enough fluid to keep your pee (urine) pale yellow.  Eat foods that have a lot of fiber in them, such as: ? Beans. ? Whole grains. ? Fresh fruits. ? Fresh vegetables. General instructions   Take over-the-counter and  prescription medicines only as told by your doctor.  Use creams or ointments only as told by your doctor.  Keep the butt area as clean and dry as you can.  Take a warm water bath (sitz bath) as told by your doctor. Do not use soap.  Keep all follow-up visits as told by your doctor. This is important. Contact a doctor if:  You have more bleeding.  You have a fever.  You have watery poop that is mixed with blood.  You have pain.  Your problem gets worse, not better. Summary  An anal fissure is a small tear or crack in the skin around the opening of the butt (anus).  This condition is usually caused by passing a large or hard poop (stool).  Treatment includes treating the problems that make it hard for you to pass poop.  Follow your doctor's instructions about caring for your condition at home.  Keep all follow-up visits as told by your doctor. This is important. This information is not intended to replace advice given to you by your health care provider. Make sure you discuss any questions you have with your health care provider. Document Revised: 03/03/2018 Document Reviewed: 03/03/2018 Elsevier Patient Education  2020 Elsevier Inc.      Agustina Caroli, MD [facility]

## 2019-11-14 ENCOUNTER — Other Ambulatory Visit: Payer: Medicare Other

## 2019-11-14 ENCOUNTER — Ambulatory Visit: Payer: Medicare Other | Attending: Internal Medicine

## 2019-11-14 DIAGNOSIS — Z23 Encounter for immunization: Secondary | ICD-10-CM

## 2019-11-14 NOTE — Progress Notes (Signed)
   Covid-19 Vaccination Clinic  Name:  Kevin Shepard    MRN: [id number] DOB: 08-19-1946  11/14/2019  Mr. Kevin Shepard was observed post Covid-19 immunization for 15 minutes without incidence. He was provided with Vaccine Information Sheet and instruction to access the V-Safe system.   Mr. Kevin Shepard was instructed to call 911 with any severe reactions post vaccine: Marland Kitchen Difficulty breathing  . Swelling of your face and throat  . A fast heartbeat  . A bad rash all over your body  . Dizziness and weakness    Immunizations Administered    Name Date Dose VIS Date Route   Pfizer COVID-19 Vaccine 11/14/2019  9:37 AM 0.3 mL 09/15/2019 Intramuscular   Manufacturer: [place]   Lot: [id number]   [place]: [id number]

## 2019-11-30 ENCOUNTER — Ambulatory Visit
Admission: RE | Admit: 2019-11-30 | Discharge: 2019-11-30 | Disposition: A | Discharge status: Home / Self Care | Payer: Medicare Other | Source: Ambulatory Visit | Attending: Emergency Medicine | Admitting: Emergency Medicine

## 2019-11-30 DIAGNOSIS — Z122 Encounter for screening for malignant neoplasm of respiratory organs: Secondary | ICD-10-CM

## 2019-11-30 DIAGNOSIS — J439 Emphysema, unspecified: Secondary | ICD-10-CM

## 2019-11-30 DIAGNOSIS — Z87891 Personal history of nicotine dependence: Secondary | ICD-10-CM

## 2019-12-10 ENCOUNTER — Ambulatory Visit: Payer: Medicare Other | Attending: Internal Medicine

## 2019-12-10 DIAGNOSIS — Z23 Encounter for immunization: Secondary | ICD-10-CM | POA: Insufficient documentation

## 2019-12-10 NOTE — Progress Notes (Signed)
   Covid-19 Vaccination Clinic  Name:  Kevin Shepard    MRN: [id number] DOB: 10-06-1945  12/10/2019  Mr. Archuleta was observed post Covid-19 immunization for 15 minutes without incident. He was provided with Vaccine Information Sheet and instruction to access the V-Safe system.   Mr. Brinkerhoff was instructed to call 911 with any severe reactions post vaccine: Marland Kitchen Difficulty breathing  . Swelling of face and throat  . A fast heartbeat  . A bad rash all over body  . Dizziness and weakness   Immunizations Administered    Name Date Dose VIS Date Route   Pfizer COVID-19 Vaccine 12/10/2019  9:49 AM 0.3 mL 09/15/2019 Intramuscular   Manufacturer: [place]   Lot: [id number]   [place]: [id number]

## 2020-01-15 ENCOUNTER — Ambulatory Visit: Payer: Medicare Other | Admitting: Emergency Medicine

## 2020-01-18 ENCOUNTER — Ambulatory Visit: Payer: Medicare Other | Admitting: Emergency Medicine

## 2020-01-23 ENCOUNTER — Other Ambulatory Visit: Payer: Self-pay

## 2020-01-23 ENCOUNTER — Ambulatory Visit (INDEPENDENT_AMBULATORY_CARE_PROVIDER_SITE_OTHER): Payer: Medicare Other | Admitting: Emergency Medicine

## 2020-01-23 ENCOUNTER — Encounter: Payer: Self-pay | Admitting: Emergency Medicine

## 2020-01-23 VITALS — BP 133/71 | HR 52 | Temp 97.4°F | Resp 16 | Ht 69.0 in | Wt 168.0 lb

## 2020-01-23 DIAGNOSIS — I2510 Atherosclerotic heart disease of native coronary artery without angina pectoris: Secondary | ICD-10-CM

## 2020-01-23 DIAGNOSIS — J449 Chronic obstructive pulmonary disease, unspecified: Secondary | ICD-10-CM | POA: Diagnosis not present

## 2020-01-23 DIAGNOSIS — I700 Atherosclerosis of aorta: Secondary | ICD-10-CM | POA: Diagnosis not present

## 2020-01-23 DIAGNOSIS — E785 Hyperlipidemia, unspecified: Secondary | ICD-10-CM

## 2020-01-23 DIAGNOSIS — R29818 Other symptoms and signs involving the nervous system: Secondary | ICD-10-CM | POA: Diagnosis not present

## 2020-01-23 DIAGNOSIS — L989 Disorder of the skin and subcutaneous tissue, unspecified: Secondary | ICD-10-CM

## 2020-01-23 DIAGNOSIS — G4700 Insomnia, unspecified: Secondary | ICD-10-CM | POA: Diagnosis not present

## 2020-01-23 MED ORDER — ROSUVASTATIN CALCIUM 10 MG PO TABS: 10.0000 mg | ORAL_TABLET | Freq: Every day | ORAL | 3 refills | Status: DC

## 2020-01-23 MED ORDER — ESZOPICLONE 3 MG PO TABS: 3.0000 mg | ORAL_TABLET | Freq: Every day | ORAL | 1 refills | Status: DC

## 2020-01-23 NOTE — Patient Instructions (Addendum)
   If you have lab work done today you will be contacted with your lab results within the next 2 weeks.  If you have not heard from us then please contact us. The fastest way to get your results is to register for My Chart.   IF you received an x-ray today, you will receive an invoice from [facility]. Please contact [facility] at [phone] with questions or concerns regarding your invoice.   IF you received labwork today, you will receive an invoice from [facility]. Please contact [facility] at [phone] with questions or concerns regarding your invoice.   Our billing staff will not be able to assist you with questions regarding bills from these companies.  You will be contacted with the lab results as soon as they are available. The fastest way to get your results is to activate your My Chart account. Instructions are located on the last page of this paperwork. If you have not heard from us regarding the results in 2 weeks, please contact this office.     Atherosclerosis  Atherosclerosis is narrowing and hardening of the arteries. Arteries are blood vessels that carry blood from the heart to all parts of the body. This blood contains oxygen. Arteries can become narrow or clogged with a buildup of fat, cholesterol, calcium, and other substances (plaque). Plaque decreases the amount of blood that can flow through the artery. Atherosclerosis can affect any artery in the body, including:  Heart arteries (coronary artery disease). This may cause a heart attack.  Brain arteries. This may cause a stroke (cerebrovascular accident).  Leg, arm, and pelvis arteries (peripheral artery disease). This may cause pain and numbness.  Kidney arteries. This may cause kidney (renal) failure. Treatment may slow the disease and prevent further damage to the heart, brain, peripheral arteries, and kidneys. What are the causes? Atherosclerosis develops slowly over many years. The inner  layers of your arteries become damaged and allow the gradual buildup of plaque. The exact cause of atherosclerosis is not fully understood. Symptoms of atherosclerosis do not occur until the artery becomes narrow or blocked. What increases the risk? The following factors may make you more likely to develop this condition:  High blood pressure.  High cholesterol.  Being middle-aged or older.  Having a family history of atherosclerosis.  Having high blood fats (triglycerides).  Diabetes.  Being overweight.  Smoking tobacco.  Not exercising enough (sedentary lifestyle).  Having a substance in the blood called C-reactive protein (CRP). This is a sign of increased levels of inflammation in the body.  Sleep apnea.  Being stressed.  Drinking too much alcohol. What are the signs or symptoms? This condition may not cause any symptoms. If you have symptoms, they are caused by damage to an area of your body that is not getting enough blood.  Coronary artery disease may cause chest pain and shortness of breath.  Decreased blood supply to your brain may cause a stroke. Signs of a stroke may include sudden: ? Weakness on one side of the body. ? Confusion. ? Changes in vision. ? Inability to speak or understand speech. ? Loss of balance, coordination, or the ability to walk. ? Severe headache. ? Loss of consciousness.  Peripheral arterial disease may cause pain and numbness, often in the legs and hips.  Renal failure may cause fatigue, nausea, swelling, and itchy skin. How is this diagnosed? This condition is diagnosed based on your medical history and a physical exam. During the exam:  Your   health care provider will: ? Check your pulse in different places. ? Listen for a "whooshing" sound over your arteries (bruit).  You may have tests, such as: ? Blood tests to check your levels of cholesterol, triglycerides, and CRP. ? Electrocardiogram (ECG) to check for heart  damage. ? Chest X-ray to see if you have an enlarged heart, which is a sign of heart failure. ? Stress test to see how your heart reacts to exercise. ? Echocardiogram to get images of the inside of your heart. ? Ankle-brachial index to compare blood pressure in your arms to blood pressure in your ankles. ? Ultrasound of your peripheral arteries to check blood flow. ? CT scan to check for damage to your heart or brain. ? X-rays of blood vessels after dye has been injected (angiogram) to check blood flow. How is this treated? Treatment starts with lifestyle changes, which may include:  Changing your diet.  Losing weight.  Reducing stress.  Exercising and being physically active more regularly.  Not smoking. You may also need medicine to:  Lower triglycerides and cholesterol.  Control blood pressure.  Prevent blood clots.  Lower inflammation in your body.  Control your blood sugar. Sometimes, surgery is needed to:  Remove plaque from an artery (endarterectomy).  Open or widen a narrowed heart artery (angioplasty).  Create a new path for your blood with one of these procedures: ? Heart (coronary) artery bypass graft surgery. ? Peripheral artery bypass graft surgery. Follow these instructions at home: Eating and drinking   Eat a heart-healthy diet. Talk with your health care provider or a diet and nutrition specialist (dietitian) if you need help. A heart-healthy diet involves: ? Limiting unhealthy fats and increasing healthy fats. Some examples of healthy fats are olive oil and canola oil. ? Eating plant-based foods, such as fruits, vegetables, nuts, whole grains, and legumes (such as peas and lentils).  Limit alcohol intake to no more than 1 drink a day for nonpregnant women and 2 drinks a day for men. One drink equals 12 oz of beer, 5 oz of wine, or 1 oz of hard liquor. Lifestyle  Follow an exercise program as told by your health care provider.  Maintain a healthy  weight. Lose weight if your health care provider says that you need to do that.  Rest when you are tired.  Learn to manage your stress.  Do not use any products that contain nicotine or tobacco, such as cigarettes and e-cigarettes. If you need help quitting, ask your health care provider.  Do not abuse drugs. General instructions  Take over-the-counter and prescription medicines only as told by your health care provider.  Manage other health conditions as told by your health care provider.  Keep all follow-up visits as told by your health care provider. This is important. Contact a health care provider if:  You have chest pain or discomfort. This includes squeezing chest pain that may feel like indigestion (angina).  You have shortness of breath.  You have an irregular heartbeat.  You have unexplained fatigue.  You have unexplained pain or numbness in an arm, leg, or hip.  You have nausea, swelling of your hands or feet, and itchy skin. Get help right away if:  You have any symptoms of a heart attack, such as: ? Chest pain. ? Shortness of breath. ? Pain in your neck, jaw, arms, back, or stomach. ? Cold sweat. ? Nausea. ? Light-headedness.  You have any symptoms of a stroke. "BE FAST" is   an easy way to remember the main warning signs of a stroke: ? B - Balance. Signs are dizziness, sudden trouble walking, or loss of balance. ? E - Eyes. Signs are trouble seeing or a sudden change in vision. ? F - Face. Signs are sudden weakness or numbness of the face, or the face or eyelid drooping on one side. ? A - Arms. Signs are weakness or numbness in an arm. This happens suddenly and usually on one side of the body. ? S - Speech. Signs are sudden trouble speaking, slurred speech, or trouble understanding what people say. ? T - Time. Time to call emergency services. Write down what time symptoms started.  You have other signs of a stroke, such as: ? A sudden, severe headache with  no known cause. ? Nausea or vomiting. ? Seizure. These symptoms may represent a serious problem that is an emergency. Do not wait to see if the symptoms will go away. Get medical help right away. Call your local emergency services (911 in the [place] Do not drive yourself to the hospital. Summary  Atherosclerosis is narrowing and hardening of the arteries.  Arteries can become narrow or clogged with a buildup of fat, cholesterol, calcium, and other substances (plaque).  This condition may not cause any symptoms. If you do have symptoms, they are caused by damage to an area of your body that is not getting enough blood.  Treatment may include lifestyle changes and medicines. In some cases, surgery is needed. This information is not intended to replace advice given to you by your health care provider. Make sure you discuss any questions you have with your health care provider. Document Revised: 12/31/2017 Document Reviewed: 05/27/2017 Elsevier Patient Education  [employer].  Chronic Obstructive Pulmonary Disease Chronic obstructive pulmonary disease (COPD) is a long-term (chronic) lung problem. When you have COPD, it is hard for air to get in and out of your lungs. Usually the condition gets worse over time, and your lungs will never return to normal. There are things you can do to keep yourself as healthy as possible.  Your doctor may treat your condition with: ? Medicines. ? Oxygen. ? Lung surgery.  Your doctor may also recommend: ? Rehabilitation. This includes steps to make your body work better. It may involve a team of specialists. ? Quitting smoking, if you smoke. ? Exercise and changes to your diet. ? Comfort measures (palliative care). Follow these instructions at home: Medicines  Take over-the-counter and prescription medicines only as told by your doctor.  Talk to your doctor before taking any cough or allergy medicines. You may need to avoid medicines that cause  your lungs to be dry. Lifestyle  If you smoke, stop. Smoking makes the problem worse. If you need help quitting, ask your doctor.  Avoid being around things that make your breathing worse. This may include smoke, chemicals, and fumes.  Stay active, but remember to rest as well.  Learn and use tips on how to relax.  Make sure you get enough sleep. Most adults need at least 7 hours of sleep every night.  Eat healthy foods. Eat smaller meals more often. Rest before meals. Controlled breathing Learn and use tips on how to control your breathing as told by your doctor. Try:  Breathing in (inhaling) through your nose for 1 second. Then, pucker your lips and breath out (exhale) through your lips for 2 seconds.  Putting one hand on your belly (abdomen). Breathe in slowly through  your nose for 1 second. Your hand on your belly should move out. Pucker your lips and breathe out slowly through your lips. Your hand on your belly should move in as you breathe out.  Controlled coughing Learn and use controlled coughing to clear mucus from your lungs. Follow these steps: 1. Lean your head a little forward. 2. Breathe in deeply. 3. Try to hold your breath for 3 seconds. 4. Keep your mouth slightly open while coughing 2 times. 5. Spit any mucus out into a tissue. 6. Rest and do the steps again 1 or 2 times as needed. General instructions  Make sure you get all the shots (vaccines) that your doctor recommends. Ask your doctor about a flu shot and a pneumonia shot.  Use oxygen therapy and pulmonary rehabilitation if told by your doctor. If you need home oxygen therapy, ask your doctor if you should buy a tool to measure your oxygen level (oximeter).  Make a COPD action plan with your doctor. This helps you to know what to do if you feel worse than usual.  Manage any other conditions you have as told by your doctor.  Avoid going outside when it is very hot, cold, or humid.  Avoid people who have  a sickness you can catch (contagious).  Keep all follow-up visits as told by your doctor. This is important. Contact a doctor if:  You cough up more mucus than usual.  There is a change in the color or thickness of the mucus.  It is harder to breathe than usual.  Your breathing is faster than usual.  You have trouble sleeping.  You need to use your medicines more often than usual.  You have trouble doing your normal activities such as getting dressed or walking around the house. Get help right away if:  You have shortness of breath while resting.  You have shortness of breath that stops you from: ? Being able to talk. ? Doing normal activities.  Your chest hurts for longer than 5 minutes.  Your skin color is more blue than usual.  Your pulse oximeter shows that you have low oxygen for longer than 5 minutes.  You have a fever.  You feel too tired to breathe normally. Summary  Chronic obstructive pulmonary disease (COPD) is a long-term lung problem.  The way your lungs work will never return to normal. Usually the condition gets worse over time. There are things you can do to keep yourself as healthy as possible.  Take over-the-counter and prescription medicines only as told by your doctor.  If you smoke, stop. Smoking makes the problem worse. This information is not intended to replace advice given to you by your health care provider. Make sure you discuss any questions you have with your health care provider. Document Revised: 09/03/2017 Document Reviewed: 10/26/2016 Elsevier Patient Education  2020 [employer].

## 2020-01-23 NOTE — Progress Notes (Signed)
Kevin Shepard 74 y.o.   Chief Complaint  Patient presents with  . Shortness of Breath    and CT chest scan x 6 months    HISTORY OF PRESENT ILLNESS: This is a 74 y.o. male with history of COPD/emphysema here for follow-up. Recent CT chest shows the following: CT CHEST LUNG CA SCREEN LOW DOSE W/O CM  Result Date: 11/30/2019 CLINICAL DATA:  74 year old asymptomatic male former smoker with 40 pack-year smoking history, quit smoking 2 years prior. EXAM: CT CHEST WITHOUT CONTRAST LOW-DOSE FOR LUNG CANCER SCREENING TECHNIQUE: Multidetector CT imaging of the chest was performed following the standard protocol without IV contrast. COMPARISON:  05/12/2018 screening chest CT. FINDINGS: Cardiovascular: Normal heart size. No significant pericardial effusion/thickening. Left anterior descending and left circumflex coronary atherosclerosis. Atherosclerotic nonaneurysmal thoracic aorta. Normal caliber pulmonary arteries. Mediastinum/Nodes: No discrete thyroid nodules. Unremarkable esophagus. No pathologically enlarged axillary, mediastinal or hilar lymph nodes, noting limited sensitivity for the detection of hilar adenopathy on this noncontrast study. Stable coarsely calcified nonenlarged subcarinal and right hilar nodes from prior granulomatous disease. Lungs/Pleura: No pneumothorax. No pleural effusion. Mild centrilobular emphysema. No acute consolidative airspace disease or lung masses. No significant growth of previously visualized scattered left solid pulmonary nodules. No new significant pulmonary nodules. Upper abdomen: No acute abnormality. Musculoskeletal: No aggressive appearing focal osseous lesions. Moderate thoracic spondylosis. IMPRESSION: 1. Lung-RADS 2, benign appearance or behavior. Continue annual screening with low-dose chest CT without contrast in 12 months. 2. Two-vessel coronary atherosclerosis. 3. Aortic Atherosclerosis (ICD10-I70.0) and Emphysema (ICD10-J43.9). Electronically Signed   By:  Ilona Sorrel M.D.   On: 11/30/2019 11:55   Last lipid profile showed elevated cholesterol and LDL.  Did not start rosuvastatin as prescribed. Lab Results  Component Value Date   CHOL 228 (H) 07/18/2019   HDL 63 07/18/2019   LDLCALC 145 (H) 07/18/2019   TRIG 116 07/18/2019   CHOLHDL 3.6 07/18/2019  Fasting today.  We will repeat lipid profile.  Advised to start rosuvastatin. CT scan of chest shows atherosclerosis of 2 coronary arteries and aorta. Has chronic sleeping problems.  Suspected sleep apnea.  Philis Fendt as needed.  Requesting to increase dose to 3 mg. Requesting dermatology referral for multiple skin lesions. COPD doing well.  Takes Breo Ellipta almost daily but occasionally skips it due to voice changes concerns. Otherwise doing well without any additional complaints.   HPI   Prior to Admission medications   Medication Sig Start Date End Date Taking? Authorizing Provider  eszopiclone (LUNESTA) 2 MG TABS tablet TAKE 1 TABLET BY MOUTH AT BEDTIME AS NEEDED FOR SLEEP. TAKE IMMEDIATELY BEFORE BEDTIME. 10/17/19  Yes Kamyra Schroeck, Ines Bloomer, MD  fluticasone furoate-vilanterol (BREO ELLIPTA) 200-25 MCG/INH AEPB Inhale 1 puff into the lungs daily. 08/03/19  Yes Gemayel Mascio, Ines Bloomer, MD  acyclovir (ZOVIRAX) 200 MG capsule Take 1 capsule (200 mg total) by mouth 4 (four) times daily for 7 days. At time of flare-up. 06/07/18 06/14/18  Horald Pollen, MD  hydrocortisone (ANUSOL-HC) 2.5 % rectal cream Place 1 application rectally 2 (two) times daily. Patient not taking: Reported on 01/23/2020 10/18/19   Horald Pollen, MD  mometasone-formoterol [facility]) 100-5 MCG/ACT AERO Inhale 2 puffs into the lungs 2 (two) times daily. Patient not taking: Reported on 10/18/2019 07/19/19   Horald Pollen, MD  rosuvastatin (CRESTOR) 10 MG tablet Take 1 tablet (10 mg total) by mouth daily. Patient not taking: Reported on 10/18/2019 07/19/19   Horald Pollen, MD    No Known  Allergies  Patient Active Problem List   Diagnosis Date Noted  . COPD  GOLD II/ Group A 06/02/2018  . Left upper lobe pulmonary nodule 06/02/2018  . Former smoker 04/06/2018  . Alcohol use 01/10/2018  . 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 Chronic bilateral low back pain without sciatica 01/10/2018  . History of tobacco use 01/10/2018  . History of back surgery 01/10/2018    Past Medical History:  Diagnosis Date  . COPD (chronic obstructive pulmonary disease) ([place])   . Hyperlipidemia     Past Surgical History:  Procedure Laterality Date  . APPENDECTOMY     per patient 1950's  . CESAREAN SECTION  03/2017  . FRACTURE SURGERY  1974   right clavicle  . LUMBAR SPINE SURGERY  2018  . SPINE SURGERY  10/2016  . TONSILLECTOMY      Social History   Socioeconomic History  . Marital status: Married    Spouse name: Not on file  . Number of children: Not on file  . Years of education: Not on file  . Highest education level: Not on file  Occupational History  . Occupation: Merchandiser, retail trading  Tobacco Use  . Smoking status: Former Smoker    Packs/day: 1.00    Years: 40.00    Pack years: 40.00    Quit date: 10/04/2017    Years since quitting: 2.3  . Smokeless tobacco: Never Used  Substance and Sexual Activity  . Alcohol use: Yes  . Drug use: Never  . Sexual activity: Yes    Partners: Female  Other Topics Concern  . Not on file  Social History Narrative  . Not on file   Social Determinants of Health   Financial Resource Strain:   . Difficulty of Paying Living Expenses:   Food Insecurity:   . Worried About Charity fundraiser in the Last Year:   . Arboriculturist in the Last Year:   Transportation Needs:   . Film/video editor (Medical):   Marland Kitchen Lack of Transportation (Non-Medical):   Physical Activity:   . Days of Exercise per Week:   . Minutes of Exercise per Session:   Stress:   . Feeling of Stress :   Social Connections:   . Frequency of  Communication with Friends and Family:   . Frequency of Social Gatherings with Friends and Family:   . Attends Religious Services:   . Active Member of Clubs or Organizations:   . Attends Archivist Meetings:   Marland Kitchen Marital Status:   Intimate Partner Violence:   . Fear of Current or Ex-Partner:   . Emotionally Abused:   Marland Kitchen Physically Abused:   . Sexually Abused:     Family History  Problem Relation Age of Onset  . Cancer Mother   . Early death Father   . Heart disease Father 80       rheumatic heart disease  . Hyperlipidemia Sister      Review of Systems  Constitutional: Negative.  Negative for chills and fever.  HENT: Negative.  Negative for congestion and sore throat.   Respiratory: Negative.  Negative for cough and shortness of breath.        Nighttime sudden awakenings with occasional sleep apnea episodes  Cardiovascular: Positive for palpitations (Nighttime). Negative for chest pain.  Gastrointestinal: Negative.  Negative for abdominal pain, blood in stool, diarrhea, melena, nausea and vomiting.  Genitourinary: Negative.  Negative for dysuria and hematuria.  Musculoskeletal: Negative.  Negative for  back pain, myalgias and neck pain.  Skin: Negative.  Negative for rash.  Neurological: Negative.  Negative for dizziness and headaches.  Endo/Heme/Allergies: Negative.   Psychiatric/Behavioral: The patient has insomnia.   All other systems reviewed and are negative.   Today's Vitals   01/23/20 0855  BP: 133/71  Pulse: (!) 52  Resp: 16  Temp: (!) 97.4 F (36.3 C)  TempSrc: Temporal  SpO2: 98%  Weight: 168 lb (76.2 kg)  Height: 5\' 9"  (1.753 m)   Body mass index is 24.81 kg/m.  Physical Exam Vitals reviewed.  Constitutional:      Appearance: Normal appearance.  HENT:     Head: Normocephalic.  Eyes:     Extraocular Movements: Extraocular movements intact.     Conjunctiva/sclera: Conjunctivae normal.     Pupils: Pupils are equal, round, and reactive to  light.  Cardiovascular:     Rate and Rhythm: Normal rate and regular rhythm.     Pulses: Normal pulses.     Heart sounds: Normal heart sounds.  Pulmonary:     Effort: Pulmonary effort is normal.     Breath sounds: Normal breath sounds.  Musculoskeletal:        General: Normal range of motion.     Cervical back: Normal range of motion and neck supple.  Skin:    General: Skin is warm and dry.     Capillary Refill: Capillary refill takes less than 2 seconds.  Neurological:     General: No focal deficit present.     Mental Status: He is alert and oriented to person, place, and time.  Psychiatric:        Mood and Affect: Mood normal.        Behavior: Behavior normal.      ASSESSMENT & PLAN: Kevin Shepard was seen today for shortness of breath.  Diagnoses and all orders for this visit:  COPD mixed type ([place])  Suspected sleep apnea -     Ambulatory referral to Sleep Studies  Atherosclerosis of aorta (HCC) -     rosuvastatin (CRESTOR) 10 MG tablet; Take 1 tablet (10 mg total) by mouth daily.  Insomnia, unspecified type -     Eszopiclone 3 MG TABS; Take 1 tablet (3 mg total) by mouth at bedtime. Take immediately before bedtime as needed.  Dyslipidemia -     Comprehensive metabolic panel -     Lipid panel -     rosuvastatin (CRESTOR) 10 MG tablet; Take 1 tablet (10 mg total) by mouth daily.  Atherosclerosis of coronary artery of native heart without angina pectoris, unspecified vessel or lesion type  Skin lesions -     Ambulatory referral to Dermatology    Patient Instructions       If you have lab work done today you will be contacted with your lab results within the next 2 weeks.  If you have not heard from [place] then please contact us. The fastest way to get your results is to register for My Chart.   IF you received an x-ray today, you will receive an invoice from [facility]. Please contact [facility] at [phone] with questions or concerns  regarding your invoice.   IF you received labwork today, you will receive an invoice from [facility]. Please contact [facility] at [phone] with questions or concerns regarding your invoice.   Our billing staff will not be able to assist you with questions regarding bills from these companies.  You will be contacted with the lab results as  soon as they are available. The fastest way to get your results is to activate your My Chart account. Instructions are located on the last page of this paperwork. If you have not heard from [place] regarding the results in 2 weeks, please contact this office.     Atherosclerosis  Atherosclerosis is narrowing and hardening of the arteries. Arteries are blood vessels that carry blood from the heart to all parts of the body. This blood contains oxygen. Arteries can become narrow or clogged with a buildup of fat, cholesterol, calcium, and other substances (plaque). Plaque decreases the amount of blood that can flow through the artery. Atherosclerosis can affect any artery in the body, including:  Heart arteries (coronary artery disease). This may cause a heart attack.  Brain arteries. This may cause a stroke (cerebrovascular accident).  Leg, arm, and pelvis arteries (peripheral artery disease). This may cause pain and numbness.  Kidney arteries. This may cause kidney (renal) failure. Treatment may slow the disease and prevent further damage to the heart, brain, peripheral arteries, and kidneys. What are the causes? Atherosclerosis develops slowly over many years. The inner layers of your arteries become damaged and allow the gradual buildup of plaque. The exact cause of atherosclerosis is not fully understood. Symptoms of atherosclerosis do not occur until the artery becomes narrow or blocked. What increases the risk? The following factors may make you more likely to develop this condition:  High blood pressure.  High cholesterol.  Being middle-aged or  older.  Having a family history of atherosclerosis.  Having high blood fats (triglycerides).  Diabetes.  Being overweight.  Smoking tobacco.  Not exercising enough (sedentary lifestyle).  Having a substance in the blood called C-reactive protein (CRP). This is a sign of increased levels of inflammation in the body.  Sleep apnea.  Being stressed.  Drinking too much alcohol. What are the signs or symptoms? This condition may not cause any symptoms. If you have symptoms, they are caused by damage to an area of your body that is not getting enough blood.  Coronary artery disease may cause chest pain and shortness of breath.  Decreased blood supply to your brain may cause a stroke. Signs of a stroke may include sudden: ? Weakness on one side of the body. ? Confusion. ? Changes in vision. ? Inability to speak or understand speech. ? Loss of balance, coordination, or the ability to walk. ? Severe headache. ? Loss of consciousness.  Peripheral arterial disease may cause pain and numbness, often in the legs and hips.  Renal failure may cause fatigue, nausea, swelling, and itchy skin. How is this diagnosed? This condition is diagnosed based on your medical history and a physical exam. During the exam:  Your health care provider will: ? Check your pulse in different places. ? Listen for a "whooshing" sound over your arteries (bruit).  You may have tests, such as: ? Blood tests to check your levels of cholesterol, triglycerides, and CRP. ? Electrocardiogram (ECG) to check for heart damage. ? Chest X-ray to see if you have an enlarged heart, which is a sign of heart failure. ? Stress test to see how your heart reacts to exercise. ? Echocardiogram to get images of the inside of your heart. ? Ankle-brachial index to compare blood pressure in your arms to blood pressure in your ankles. ? Ultrasound of your peripheral arteries to check blood flow. ? CT scan to check for damage to  your heart or brain. ? X-rays of blood vessels  after dye has been injected (angiogram) to check blood flow. How is this treated? Treatment starts with lifestyle changes, which may include:  Changing your diet.  Losing weight.  Reducing stress.  Exercising and being physically active more regularly.  Not smoking. You may also need medicine to:  Lower triglycerides and cholesterol.  Control blood pressure.  Prevent blood clots.  Lower inflammation in your body.  Control your blood sugar. Sometimes, surgery is needed to:  Remove plaque from an artery (endarterectomy).  Open or widen a narrowed heart artery (angioplasty).  Create a new path for your blood with one of these procedures: ? Heart (coronary) artery bypass graft surgery. ? Peripheral artery bypass graft surgery. Follow these instructions at home: Eating and drinking   Eat a heart-healthy diet. Talk with your health care provider or a diet and nutrition specialist (dietitian) if you need help. A heart-healthy diet involves: ? Limiting unhealthy fats and increasing healthy fats. Some examples of healthy fats are olive oil and canola oil. ? Eating plant-based foods, such as fruits, vegetables, nuts, whole grains, and legumes (such as peas and lentils).  Limit alcohol intake to no more than 1 drink a day for nonpregnant women and 2 drinks a day for men. One drink equals 12 oz of beer, 5 oz of wine, or 1 oz of hard liquor. Lifestyle  Follow an exercise program as told by your health care provider.  Maintain a healthy weight. Lose weight if your health care provider says that you need to do that.  Rest when you are tired.  Learn to manage your stress.  Do not use any products that contain nicotine or tobacco, such as cigarettes and e-cigarettes. If you need help quitting, ask your health care provider.  Do not abuse drugs. General instructions  Take over-the-counter and prescription medicines only as told  by your health care provider.  Manage other health conditions as told by your health care provider.  Keep all follow-up visits as told by your health care provider. This is important. Contact a health care provider if:  You have chest pain or discomfort. This includes squeezing chest pain that may feel like indigestion (angina).  You have shortness of breath.  You have an irregular heartbeat.  You have unexplained fatigue.  You have unexplained pain or numbness in an arm, leg, or hip.  You have nausea, swelling of your hands or feet, and itchy skin. Get help right away if:  You have any symptoms of a heart attack, such as: ? Chest pain. ? Shortness of breath. ? Pain in your neck, jaw, arms, back, or stomach. ? Cold sweat. ? Nausea. ? Light-headedness.  You have any symptoms of a stroke. "BE FAST" is an easy way to remember the main warning signs of a stroke: ? B - Balance. Signs are dizziness, sudden trouble walking, or loss of balance. ? E - Eyes. Signs are trouble seeing or a sudden change in vision. ? F - Face. Signs are sudden weakness or numbness of the face, or the face or eyelid drooping on one side. ? A - Arms. Signs are weakness or numbness in an arm. This happens suddenly and usually on one side of the body. ? S - Speech. Signs are sudden trouble speaking, slurred speech, or trouble understanding what people say. ? T - Time. Time to call emergency services. Write down what time symptoms started.  You have other signs of a stroke, such as: ? A sudden, severe headache  with no known cause. ? Nausea or vomiting. ? Seizure. These symptoms may represent a serious problem that is an emergency. Do not wait to see if the symptoms will go away. Get medical help right away. Call your local emergency services (911 in the [place] Do not drive yourself to the hospital. Summary  Atherosclerosis is narrowing and hardening of the arteries.  Arteries can become narrow or clogged  with a buildup of fat, cholesterol, calcium, and other substances (plaque).  This condition may not cause any symptoms. If you do have symptoms, they are caused by damage to an area of your body that is not getting enough blood.  Treatment may include lifestyle changes and medicines. In some cases, surgery is needed. This information is not intended to replace advice given to you by your health care provider. Make sure you discuss any questions you have with your health care provider. Document Revised: 12/31/2017 Document Reviewed: 05/27/2017 Elsevier Patient Education  [place].  Chronic Obstructive Pulmonary Disease Chronic obstructive pulmonary disease (COPD) is a long-term (chronic) lung problem. When you have COPD, it is hard for air to get in and out of your lungs. Usually the condition gets worse over time, and your lungs will never return to normal. There are things you can do to keep yourself as healthy as possible.  Your doctor may treat your condition with: ? Medicines. ? Oxygen. ? Lung surgery.  Your doctor may also recommend: ? Rehabilitation. This includes steps to make your body work better. It may involve a team of specialists. ? Quitting smoking, if you smoke. ? Exercise and changes to your diet. ? Comfort measures (palliative care). Follow these instructions at home: Medicines  Take over-the-counter and prescription medicines only as told by your doctor.  Talk to your doctor before taking any cough or allergy medicines. You may need to avoid medicines that cause your lungs to be dry. Lifestyle  If you smoke, stop. Smoking makes the problem worse. If you need help quitting, ask your doctor.  Avoid being around things that make your breathing worse. This may include smoke, chemicals, and fumes.  Stay active, but remember to rest as well.  Learn and use tips on how to relax.  Make sure you get enough sleep. Most adults need at least 7 hours of sleep  every night.  Eat healthy foods. Eat smaller meals more often. Rest before meals. Controlled breathing Learn and use tips on how to control your breathing as told by your doctor. Try:  Breathing in (inhaling) through your nose for 1 second. Then, pucker your lips and breath out (exhale) through your lips for 2 seconds.  Putting one hand on your belly (abdomen). Breathe in slowly through your nose for 1 second. Your hand on your belly should move out. Pucker your lips and breathe out slowly through your lips. Your hand on your belly should move in as you breathe out.  Controlled coughing Learn and use controlled coughing to clear mucus from your lungs. Follow these steps: 1. Lean your head a little forward. 2. Breathe in deeply. 3. Try to hold your breath for 3 seconds. 4. Keep your mouth slightly open while coughing 2 times. 5. Spit any mucus out into a tissue. 6. Rest and do the steps again 1 or 2 times as needed. General instructions  Make sure you get all the shots (vaccines) that your doctor recommends. Ask your doctor about a flu shot and a pneumonia shot.  Use oxygen  therapy and pulmonary rehabilitation if told by your doctor. If you need home oxygen therapy, ask your doctor if you should buy a tool to measure your oxygen level (oximeter).  Make a COPD action plan with your doctor. This helps you to know what to do if you feel worse than usual.  Manage any other conditions you have as told by your doctor.  Avoid going outside when it is very hot, cold, or humid.  Avoid people who have a sickness you can catch (contagious).  Keep all follow-up visits as told by your doctor. This is important. Contact a doctor if:  You cough up more mucus than usual.  There is a change in the color or thickness of the mucus.  It is harder to breathe than usual.  Your breathing is faster than usual.  You have trouble sleeping.  You need to use your medicines more often than  usual.  You have trouble doing your normal activities such as getting dressed or walking around the house. Get help right away if:  You have shortness of breath while resting.  You have shortness of breath that stops you from: ? Being able to talk. ? Doing normal activities.  Your chest hurts for longer than 5 minutes.  Your skin color is more blue than usual.  Your pulse oximeter shows that you have low oxygen for longer than 5 minutes.  You have a fever.  You feel too tired to breathe normally. Summary  Chronic obstructive pulmonary disease (COPD) is a long-term lung problem.  The way your lungs work will never return to normal. Usually the condition gets worse over time. There are things you can do to keep yourself as healthy as possible.  Take over-the-counter and prescription medicines only as told by your doctor.  If you smoke, stop. Smoking makes the problem worse. This information is not intended to replace advice given to you by your health care provider. Make sure you discuss any questions you have with your health care provider. Document Revised: 09/03/2017 Document Reviewed: 10/26/2016 Elsevier Patient Education  2020 Else[employer] Inc.      Agustina Caroli, MD [facility]

## 2020-01-24 ENCOUNTER — Encounter: Payer: Self-pay | Admitting: Emergency Medicine

## 2020-01-24 LAB — COMPREHENSIVE METABOLIC PANEL
ALT: 18 IU/L (ref 0–44)
AST: 17 IU/L (ref 0–40)
Albumin/Globulin Ratio: 2 (ref 1.2–2.2)
Albumin: 4.7 g/dL (ref 3.7–4.7)
Alkaline Phosphatase: 49 IU/L (ref 39–117)
BUN/Creatinine Ratio: 11 (ref 10–24)
BUN: 12 mg/dL (ref 8–27)
Bilirubin Total: 0.6 mg/dL (ref 0.0–1.2)
CO2: 23 mmol/L (ref 20–29)
Calcium: 9.3 mg/dL (ref 8.6–10.2)
Chloride: 103 mmol/L (ref 96–106)
Creatinine, Ser: 1.06 mg/dL (ref 0.76–1.27)
GFR calc Af Amer: 80 mL/min/{1.73_m2} (ref >59)
GFR calc non Af Amer: 69 mL/min/{1.73_m2} (ref >59)
Globulin, Total: 2.4 g/dL (ref 1.5–4.5)
Glucose: 99 mg/dL (ref 65–99)
Potassium: 4.9 mmol/L (ref 3.5–5.2)
Sodium: 140 mmol/L (ref 134–144)
Total Protein: 7.1 g/dL (ref 6.0–8.5)

## 2020-01-24 LAB — LIPID PANEL
Chol/HDL Ratio: 3.6 ratio (ref 0.0–5.0)
Cholesterol, Total: 214 mg/dL — ABNORMAL HIGH (ref 100–199)
HDL: 60 mg/dL (ref >39)
LDL Chol Calc (NIH): 134 mg/dL — ABNORMAL HIGH (ref 0–99)
Triglycerides: 115 mg/dL (ref 0–149)
VLDL Cholesterol Cal: 20 mg/dL (ref 5–40)

## 2020-02-01 ENCOUNTER — Ambulatory Visit (INDEPENDENT_AMBULATORY_CARE_PROVIDER_SITE_OTHER): Payer: Medicare Other | Admitting: Neurology

## 2020-02-01 ENCOUNTER — Other Ambulatory Visit: Payer: Self-pay

## 2020-02-01 ENCOUNTER — Ambulatory Visit (INDEPENDENT_AMBULATORY_CARE_PROVIDER_SITE_OTHER): Payer: Medicare Other | Admitting: Emergency Medicine

## 2020-02-01 ENCOUNTER — Encounter: Payer: Self-pay | Admitting: Neurology

## 2020-02-01 VITALS — BP 121/68 | Ht 69.0 in | Wt 168.0 lb

## 2020-02-01 VITALS — BP 121/66 | HR 56 | Temp 97.2°F | Ht 69.0 in | Wt 168.0 lb

## 2020-02-01 DIAGNOSIS — R002 Palpitations: Secondary | ICD-10-CM | POA: Diagnosis not present

## 2020-02-01 DIAGNOSIS — R0602 Shortness of breath: Secondary | ICD-10-CM | POA: Diagnosis not present

## 2020-02-01 DIAGNOSIS — G479 Sleep disorder, unspecified: Secondary | ICD-10-CM | POA: Diagnosis not present

## 2020-02-01 DIAGNOSIS — I2510 Atherosclerotic heart disease of native coronary artery without angina pectoris: Secondary | ICD-10-CM

## 2020-02-01 DIAGNOSIS — R5383 Other fatigue: Secondary | ICD-10-CM | POA: Diagnosis not present

## 2020-02-01 DIAGNOSIS — J449 Chronic obstructive pulmonary disease, unspecified: Secondary | ICD-10-CM | POA: Diagnosis not present

## 2020-02-01 DIAGNOSIS — G4700 Insomnia, unspecified: Secondary | ICD-10-CM

## 2020-02-01 DIAGNOSIS — Z0000 Encounter for general adult medical examination without abnormal findings: Secondary | ICD-10-CM

## 2020-02-01 DIAGNOSIS — R351 Nocturia: Secondary | ICD-10-CM

## 2020-02-01 NOTE — Patient Instructions (Addendum)

## 2020-02-01 NOTE — Progress Notes (Signed)
Subjective:    Patient ID: Kevin Shepard is a 74 y.o. male.  HPI     Star Age, MD, PhD [facility] [address] [place], [place] [address]  Dear Dr. Mitchel Honour,   I saw your patient, Kevin Shepard, upon your kind request, in my Sleep clinic today for initial consultation of his sleep disorder, in particular, his difficulty initiating sleep.  The patient is unaccompanied today.  As you know, [name] is a 74 year old right-handed gentleman with an underlying medical history of COPD, prior smoking, and hyperlipidemia, who reports chronic difficulty maintaining sleep.  He typically has no difficulty falling asleep but often wakes up between 2 and 3 AM and has trouble going back to sleep.  He has been taking Lunesta but does not take it nightly, typically 2-3 times out of the week and it has been helpful.  He currently takes 3 mg generic.  He has no family history of sleep apnea, he does not report any snoring, he does recall that at the time of his back surgery some 3 years ago his oxygen monitor would cover for repeatedly.  He has woken up at night with palpitations and shortness of breath and sometimes has shortness of breath with even minimal exertion but not at rest.  His COPD is generally well controlled.  His bedtime is generally between 10 and 10:30 PM and rise time between 630 and 7:30 AM.  He works as a Designer, jewellery.  He lives with his wife and Psychiatrist.  He drinks caffeine in the form of coffee, 2 to 4 cups/day, tries to hydrate well with water, drinks alcohol in the form of wine, 3-4 times out of the week.  He denies morning headaches but has been waking up to go to the bathroom generally once per average night.  He does not have a TV in the bedroom and no pets in the household.  He quit smoking some 3 years ago.  He had a tonsillectomy as a child.  His surgical history also mentioned C-section which is obviously erroneous by I do not know  how to take that information out of his history, he had seen it also in the past.  I reviewed your office note from 01/31/2020.  His Epworth sleepiness score is 5 out of 24, fatigue severity score is 42 out of 63.  His Past Medical History Is Significant For: Past Medical History:  Diagnosis Date  . COPD (chronic obstructive pulmonary disease) ([place])   . Hyperlipidemia     His Past Surgical History Is Significant For: Past Surgical History:  Procedure Laterality Date  . APPENDECTOMY     per patient 1950's  . CESAREAN SECTION  03/2017  . FRACTURE SURGERY  1974   right clavicle  . LUMBAR SPINE SURGERY  2018  . SPINE SURGERY  10/2016  . TONSILLECTOMY      His Family History Is Significant For: Family History  Problem Relation Age of Onset  . Cancer Mother   . Early death Father   . Heart disease Father 60       rheumatic heart disease  . Hyperlipidemia Sister     His Social History Is Significant For: Social History   Socioeconomic History  . Marital status: Married    Spouse name: Not on file  . Number of children: Not on file  . Years of education: Not on file  . Highest education level: Not on file  Occupational History  .  Occupation: Merchandiser, retail trading  Tobacco Use  . Smoking status: Former Smoker    Packs/day: 1.00    Years: 40.00    Pack years: 40.00    Quit date: 10/04/2017    Years since quitting: 2.3  . Smokeless tobacco: Never Used  Substance and Sexual Activity  . Alcohol use: Yes  . Drug use: Never  . Sexual activity: Yes    Partners: Female  Other Topics Concern  . Not on file  Social History Narrative  . Not on file   Social Determinants of Health   Financial Resource Strain:   . Difficulty of Paying Living Expenses:   Food Insecurity:   . Worried About Charity fundraiser in the Last Year:   . Arboriculturist in the Last Year:   Transportation Needs:   . Film/video editor (Medical):   Marland Kitchen Lack of Transportation (Non-Medical):    Physical Activity:   . Days of Exercise per Week:   . Minutes of Exercise per Session:   Stress:   . Feeling of Stress :   Social Connections:   . Frequency of Communication with Friends and Family:   . Frequency of Social Gatherings with Friends and Family:   . Attends Religious Services:   . Active Member of Clubs or Organizations:   . Attends Archivist Meetings:   Marland Kitchen Marital Status:     His Allergies Are:  No Known Allergies:   His Current Medications Are:  Outpatient Encounter Medications as of 02/01/2020  Medication Sig  . Eszopiclone 3 MG TABS Take 1 tablet (3 mg total) by mouth at bedtime. Take immediately before bedtime as needed.  . fluticasone furoate-vilanterol (BREO ELLIPTA) 200-25 MCG/INH AEPB Inhale 1 puff into the lungs daily.  . hydrocortisone (ANUSOL-HC) 2.5 % rectal cream Place 1 application rectally 2 (two) times daily.  . rosuvastatin (CRESTOR) 10 MG tablet Take 1 tablet (10 mg total) by mouth daily.  Marland Kitchen acyclovir (ZOVIRAX) 200 MG capsule Take 1 capsule (200 mg total) by mouth 4 (four) times daily for 7 days. At time of flare-up.   No facility-administered encounter medications on file as of 02/01/2020.  :  Review of Systems:  Out of a complete 14 point review of systems, all are reviewed and negative with the exception of these symptoms as listed below: Review of Systems  Neurological:       Here for sleep consult. No prior sleep study. Pt doe not think he snores at night but does struggle with daytime fatigue/ sleepiness.  Epworth Sleepiness Scale 0= would never doze 1= slight chance of dozing 2= moderate chance of dozing 3= high chance of dozing  Sitting and reading:1 Watching TV:1 Sitting inactive in a public place (ex. Theater or meeting):0 As a passenger in a car for an hour without a break:0 Lying down to rest in the afternoon:2 Sitting and talking to someone:0 Sitting quietly after lunch (no alcohol):1 In a car, while stopped in  traffic:0 Total:5     Objective:  Neurological Exam  Physical Exam Physical Examination:   Vitals:   02/01/20 0756  BP: 121/66  Pulse: (!) 56  Temp: (!) 97.2 F (36.2 C)    General Examination: The patient is a very pleasant 74 y.o. male in no acute distress. He appears well-developed and well-nourished and well groomed.   HEENT: Normocephalic, atraumatic, pupils are equal, round and reactive to light, extraocular tracking is good without limitation to gaze excursion or nystagmus  noted. Hearing is grossly intact. Face is symmetric with normal facial animation. Speech is clear with no dysarthria noted. There is no hypophonia. There is no lip, neck/head, jaw or voice tremor. Neck is supple with full range of passive and active motion. There are no carotid bruits on auscultation. Oropharynx exam reveals: mild mouth dryness, adequate dental hygiene with crowns and implants and moderate airway crowding, due to small airway entry and larger uvula noted, tonsils are absent, Mallampati class III.  Tongue protrudes centrally and palate elevates symmetrically, neck circumference at 16 inches.  He has a mild overbite.  Chest: Clear to auscultation without wheezing, rhonchi or crackles noted.  Heart: S1+S2+0, regular and normal without murmurs, rubs or gallops noted.   Abdomen: Soft, non-tender and non-distended with normal bowel sounds appreciated on auscultation.  Extremities: There is no pitting edema in the distal lower extremities bilaterally.   Skin: Warm and dry without trophic changes noted.   Musculoskeletal: exam reveals no obvious joint deformities, tenderness or joint swelling or erythema.   Neurologically:  Mental status: The patient is awake, alert and oriented in all 4 spheres. His immediate and remote memory, attention, language skills and fund of knowledge are appropriate. There is no evidence of aphasia, agnosia, apraxia or anomia. Speech is clear with normal prosody and  enunciation. Thought process is linear. Mood is normal and affect is normal.  Cranial nerves II - XII are as described above under HEENT exam.  Motor exam: Normal bulk, strength and tone is noted. There is no tremor, Romberg is negative. Fine motor skills and coordination: grossly intact.  Cerebellar testing: No dysmetria or intention tremor. There is no truncal or gait ataxia.  Sensory exam: intact to light touch in the upper and lower extremities.  Gait, station and balance: He stands easily. No veering to one side is noted. No leaning to one side is noted. Posture is age-appropriate and stance is narrow based. Gait shows normal stride length and normal pace. No problems turning are noted. Tandem walk is slightly challenging for him.                 Assessment and Plan:  In summary, Jaheed Adem is a very pleasant 74 y.o.-year old male with an underlying medical history of COPD, prior smoking, and hyperlipidemia, whose  history and physical exam concerning for obstructive sleep apnea (OSA). I had a long chat with the patient about my findings and the diagnosis of OSA, its prognosis and treatment options. We talked about medical treatments, surgical interventions and non-pharmacological approaches. I explained in particular the risks and ramifications of untreated moderate to severe OSA, especially with respect to developing cardiovascular disease down the Road, including congestive heart failure, difficult to treat hypertension, cardiac arrhythmias, or stroke. Even type 2 diabetes has, in part, been linked to untreated OSA. Symptoms of untreated OSA include daytime sleepiness, memory problems, mood irritability and mood disorder such as depression and anxiety, lack of energy, as well as recurrent headaches, especially morning headaches. We talked about trying to maintain a healthy lifestyle in general, as well as the importance of weight control. We also talked about the importance of good sleep  hygiene. I recommended the following at this time: sleep study.   I explained the sleep test procedure to the patient and also outlined possible surgical and non-surgical treatment options of OSA, including the use of a custom-made dental device (which would require a referral to a specialist dentist or oral surgeon), upper airway surgical  options, such as traditional UPPP or a novel less invasive surgical option in the form of Inspire hypoglossal nerve stimulation (which would involve a referral to an ENT surgeon). I also explained the CPAP treatment option to the patient, who indicated that he would be willing to try CPAP if the need arises. I explained the importance of being compliant with PAP treatment, not only for insurance purposes but primarily to improve His symptoms, and for the patient's long term health benefit, including to reduce His cardiovascular risks. I answered all his questions today and the patient was in agreement. I plan to see him back after the sleep study is completed and encouraged him to call with any interim questions, concerns, problems or updates.   Thank you very much for allowing me to participate in the care of this nice patient. If I can be of any further assistance to you please do not hesitate to call me at [phone].  Sincerely,   Star Age, MD, PhD

## 2020-02-01 NOTE — Progress Notes (Signed)
Presents today for [employer] Visit   Date of last exam: 10/18/2019  Interpreter used for this visit?  No  I connected with  Kevin Shepard on 02/01/20 by a telephone  and verified that I am speaking with the correct person using two identifiers.   I discussed the limitations of evaluation and management by telemedicine. The patient expressed understanding and agreed to proceed.    Patient Care Team: Horald Pollen, MD as PCP - General (Internal Medicine)   Other items to address today:   Discussed Eye/Dental Discussed immunizations    Other Screening: Last screening for diabetes: 01/23/2020 Last lipid screening: 01-23-2020  ADVANCE DIRECTIVES: Discussed: yes On File: no Materials Provided: no  Immunization status:  Immunization History  Administered Date(s) Administered  . Influenza, High Dose Seasonal PF 06/07/2018  . Influenza-Unspecified 07/05/2017  . PFIZER SARS-COV-2 Vaccination 11/14/2019, 12/10/2019  . Pneumococcal Conjugate-13 06/27/2014  . Pneumococcal Polysaccharide-23 02/05/2006, 05/15/2013  . Tdap 08/16/2007     Health Maintenance Due  Topic Date Due  . Hepatitis C Screening  Never done     Functional Status Survey: Is the patient deaf or have difficulty hearing?: No Does the patient have difficulty seeing, even when wearing glasses/contacts?: No Does the patient have difficulty concentrating, remembering, or making decisions?: No Does the patient have difficulty walking or climbing stairs?: No Does the patient have difficulty dressing or bathing?: No Does the patient have difficulty doing errands alone such as visiting a doctor's office or shopping?: No   6CIT Screen 02/01/2020  What Year? 0 points  What month? 0 points  What time? 0 points  Count back from 20 0 points  Months in reverse 0 points  Repeat phrase 0 points  Total Score 0        Clinical Support from 02/01/2020 in [facility]  AUDIT-C  Score  7       Home Environment:    Lives in one story home No trouble climbing stairs No grab bars No scattered rugs Adequate lighting/ no clutter   Patient Active Problem List   Diagnosis Date Noted  . COPD  GOLD II/ Group A 06/02/2018  . Left upper lobe pulmonary nodule 06/02/2018  . Former smoker 04/06/2018  . Alcohol use 01/10/2018  . 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 Chronic bilateral low back pain without sciatica 01/10/2018  . History of tobacco use 01/10/2018  . History of back surgery 01/10/2018     Past Medical History:  Diagnosis Date  . COPD (chronic obstructive pulmonary disease) ([place])   . Hyperlipidemia      Past Surgical History:  Procedure Laterality Date  . APPENDECTOMY     per patient 1950's  . CESAREAN SECTION  03/2017  . FRACTURE SURGERY  1974   right clavicle  . LUMBAR SPINE SURGERY  2018  . SPINE SURGERY  10/2016  . TONSILLECTOMY       Family History  Problem Relation Age of Onset  . Cancer Mother   . Early death Father   . Heart disease Father 20       rheumatic heart disease  . Hyperlipidemia Sister      Social History   Socioeconomic History  . Marital status: Married    Spouse name: Not on file  . Number of children: Not on file  . Years of education: Not on file  . Highest education level: Not on file  Occupational History  . Occupation: Merchandiser, retail  trading  Tobacco Use  . Smoking status: Former Smoker    Packs/day: 1.00    Years: 40.00    Pack years: 40.00    Quit date: 10/04/2017    Years since quitting: 2.3  . Smokeless tobacco: Never Used  Substance and Sexual Activity  . Alcohol use: Yes  . Drug use: Never  . Sexual activity: Yes    Partners: Female  Other Topics Concern  . Not on file  Social History Narrative  . Not on file   Social Determinants of Health   Financial Resource Strain:   . Difficulty of Paying Living Expenses:   Food Insecurity:   . Worried About Charity fundraiser  in the Last Year:   . Arboriculturist in the Last Year:   Transportation Needs:   . Film/video editor (Medical):   Marland Kitchen Lack of Transportation (Non-Medical):   Physical Activity:   . Days of Exercise per Week:   . Minutes of Exercise per Session:   Stress:   . Feeling of Stress :   Social Connections:   . Frequency of Communication with Friends and Family:   . Frequency of Social Gatherings with Friends and Family:   . Attends Religious Services:   . Active Member of Clubs or Organizations:   . Attends Archivist Meetings:   Marland Kitchen Marital Status:   Intimate Partner Violence:   . Fear of Current or Ex-Partner:   . Emotionally Abused:   Marland Kitchen Physically Abused:   . Sexually Abused:      No Known Allergies   Prior to Admission medications   Medication Sig Start Date End Date Taking? Authorizing Provider  Eszopiclone 3 MG TABS Take 1 tablet (3 mg total) by mouth at bedtime. Take immediately before bedtime as needed. 01/23/20  Yes Sagardia, Ines Bloomer, MD  fluticasone furoate-vilanterol (BREO ELLIPTA) 200-25 MCG/INH AEPB Inhale 1 puff into the lungs daily. 08/03/19  Yes Sagardia, Ines Bloomer, MD  rosuvastatin (CRESTOR) 10 MG tablet Take 1 tablet (10 mg total) by mouth daily. 01/23/20  Yes Sagardia, Ines Bloomer, MD  acyclovir (ZOVIRAX) 200 MG capsule Take 1 capsule (200 mg total) by mouth 4 (four) times daily for 7 days. At time of flare-up. 06/07/18 06/14/18  Horald Pollen, MD  hydrocortisone (ANUSOL-HC) 2.5 % rectal cream Place 1 application rectally 2 (two) times daily. Patient not taking: Reported on 02/01/2020 10/18/19   Horald Pollen, MD     Depression screen [facility] 2/9 02/01/2020 01/23/2020 10/18/2019 07/17/2019 10/13/2018  Decreased Interest 0 0 0 0 0  Down, Depressed, Hopeless 0 0 0 0 0  PHQ - 2 Score 0 0 0 0 0     Fall Risk  02/01/2020 01/23/2020 10/18/2019 07/17/2019 10/13/2018  Falls in the past year? 0 0 0 0 0  Number falls in past yr: 0 - - - -  Injury with  Fall? 0 - - - -  Follow up Falls evaluation completed;Education provided Falls evaluation completed Falls evaluation completed Falls evaluation completed -      PHYSICAL EXAM: BP 121/68 Comment: not clinic  Ht 5\' 9"  (1.753 m)   Wt 168 lb (76.2 kg)   BMI 24.81 kg/m    Wt Readings from Last 3 Encounters:  02/01/20 168 lb (76.2 kg)  02/01/20 168 lb (76.2 kg)  01/23/20 168 lb (76.2 kg)      Education/Counseling provided regarding diet and exercise, prevention of chronic diseases, smoking/tobacco cessation, if applicable, and  reviewed "Covered Medicare Preventive Services."

## 2020-02-01 NOTE — Patient Instructions (Signed)
Thank you for taking time to come for your Medicare Wellness Visit. I appreciate your ongoing commitment to your health goals. Please review the following plan we discussed and let me know if I can assist you in the future.  Larue Drawdy LPN  Preventive Care 65 Years and Older, Male Preventive care refers to lifestyle choices and visits with your health care provider that can promote health and wellness. This includes:  A yearly physical exam. This is also called an annual well check.  Regular dental and eye exams.  Immunizations.  Screening for certain conditions.  Healthy lifestyle choices, such as diet and exercise. What can I expect for my preventive care visit? Physical exam Your health care provider will check:  Height and weight. These may be used to calculate body mass index (BMI), which is a measurement that tells if you are at a healthy weight.  Heart rate and blood pressure.  Your skin for abnormal spots. Counseling Your health care provider may ask you questions about:  Alcohol, tobacco, and drug use.  Emotional well-being.  Home and relationship well-being.  Sexual activity.  Eating habits.  History of falls.  Memory and ability to understand (cognition).  Work and work environment. What immunizations do I need?  Influenza (flu) vaccine  This is recommended every year. Tetanus, diphtheria, and pertussis (Tdap) vaccine  You may need a Td booster every 10 years. Varicella (chickenpox) vaccine  You may need this vaccine if you have not already been vaccinated. Zoster (shingles) vaccine  You may need this after age 60. Pneumococcal conjugate (PCV13) vaccine  One dose is recommended after age 65. Pneumococcal polysaccharide (PPSV23) vaccine  One dose is recommended after age 65. Measles, mumps, and rubella (MMR) vaccine  You may need at least one dose of MMR if you were born in 1957 or later. You may also need a second dose. Meningococcal  conjugate (MenACWY) vaccine  You may need this if you have certain conditions. Hepatitis A vaccine  You may need this if you have certain conditions or if you travel or work in places where you may be exposed to hepatitis A. Hepatitis B vaccine  You may need this if you have certain conditions or if you travel or work in places where you may be exposed to hepatitis B. Haemophilus influenzae type b (Hib) vaccine  You may need this if you have certain conditions. You may receive vaccines as individual doses or as more than one vaccine together in one shot (combination vaccines). Talk with your health care provider about the risks and benefits of combination vaccines. What tests do I need? Blood tests  Lipid and cholesterol levels. These may be checked every 5 years, or more frequently depending on your overall health.  Hepatitis C test.  Hepatitis B test. Screening  Lung cancer screening. You may have this screening every year starting at age 55 if you have a 30-pack-year history of smoking and currently smoke or have quit within the past 15 years.  Colorectal cancer screening. All adults should have this screening starting at age 50 and continuing until age 75. Your health care provider may recommend screening at age 45 if you are at increased risk. You will have tests every 1-10 years, depending on your results and the type of screening test.  Prostate cancer screening. Recommendations will vary depending on your family history and other risks.  Diabetes screening. This is done by checking your blood sugar (glucose) after you have not eaten for   while (fasting). You may have this done every 1-3 years.  Abdominal aortic aneurysm (AAA) screening. You may need this if you are a current or former smoker.  Sexually transmitted disease (STD) testing. Follow these instructions at home: Eating and drinking  Eat a diet that includes fresh fruits and vegetables, whole grains, lean  protein, and low-fat dairy products. Limit your intake of foods with high amounts of sugar, saturated fats, and salt.  Take vitamin and mineral supplements as recommended by your health care provider.  Do not drink alcohol if your health care provider tells you not to drink.  If you drink alcohol: ? Limit how much you have to 0-2 drinks a day. ? Be aware of how much alcohol is in your drink. In the [place] one drink equals one 12 oz bottle of beer (355 mL), one 5 oz glass of wine (148 mL), or one 1 oz glass of hard liquor (44 mL). Lifestyle  Take daily care of your teeth and gums.  Stay active. Exercise for at least 30 minutes on 5 or more days each week.  Do not use any products that contain nicotine or tobacco, such as cigarettes, e-cigarettes, and chewing tobacco. If you need help quitting, ask your health care provider.  If you are sexually active, practice safe sex. Use a condom or other form of protection to prevent STIs (sexually transmitted infections).  Talk with your health care provider about taking a low-dose aspirin or statin. What's next?  Visit your health care provider once a year for a well check visit.  Ask your health care provider how often you should have your eyes and teeth checked.  Stay up to date on all vaccines. This information is not intended to replace advice given to you by your health care provider. Make sure you discuss any questions you have with your health care provider. Document Revised: 09/15/2018 Document Reviewed: 09/15/2018 Elsevier Patient Education  2020 [employer].

## 2020-02-07 ENCOUNTER — Telehealth: Payer: Self-pay | Admitting: *Deleted

## 2020-02-07 NOTE — Telephone Encounter (Signed)
Left message in mobile voice mail to return call, [facility] has a refill request for Breo inhaler.

## 2020-02-08 ENCOUNTER — Other Ambulatory Visit: Payer: Self-pay | Admitting: Emergency Medicine

## 2020-02-08 DIAGNOSIS — J449 Chronic obstructive pulmonary disease, unspecified: Secondary | ICD-10-CM

## 2020-02-08 MED ORDER — FLUTICASONE FUROATE-VILANTEROL 200-25 MCG/INH IN AEPB: 1.0000 | INHALATION_SPRAY | Freq: Every day | RESPIRATORY_TRACT | 5 refills | Status: DC

## 2020-02-12 ENCOUNTER — Other Ambulatory Visit: Payer: Self-pay | Admitting: Emergency Medicine

## 2020-02-12 DIAGNOSIS — G4700 Insomnia, unspecified: Secondary | ICD-10-CM

## 2020-02-12 NOTE — Telephone Encounter (Signed)
Patient is requesting a refill of the following medications: Requested Prescriptions   Pending Prescriptions Disp Refills  . eszopiclone (LUNESTA) 2 MG TABS tablet [Pharmacy Med Name: ESZOPICLONE 2MG  TABLETS] 30 tablet     Sig: TAKE 1 TABLET BY MOUTH AT BEDTIME AS NEEDED FOR SLEEP    Date of patient request: 02/12/2020 Last office visit: 4//29/2021 Date of last refill: 01/23/2020 Last refill amount: 30 tablets  Follow up time period per chart: Follow up Appointment schedule

## 2020-02-26 ENCOUNTER — Ambulatory Visit (INDEPENDENT_AMBULATORY_CARE_PROVIDER_SITE_OTHER): Payer: Medicare Other | Admitting: Neurology

## 2020-02-26 DIAGNOSIS — R5383 Other fatigue: Secondary | ICD-10-CM

## 2020-02-26 DIAGNOSIS — G4733 Obstructive sleep apnea (adult) (pediatric): Secondary | ICD-10-CM | POA: Diagnosis not present

## 2020-02-26 DIAGNOSIS — R002 Palpitations: Secondary | ICD-10-CM

## 2020-02-26 DIAGNOSIS — J449 Chronic obstructive pulmonary disease, unspecified: Secondary | ICD-10-CM

## 2020-02-26 DIAGNOSIS — G4700 Insomnia, unspecified: Secondary | ICD-10-CM

## 2020-02-26 DIAGNOSIS — R0602 Shortness of breath: Secondary | ICD-10-CM

## 2020-02-26 DIAGNOSIS — G4720 Circadian rhythm sleep disorder, unspecified type: Secondary | ICD-10-CM

## 2020-02-26 DIAGNOSIS — R0683 Snoring: Secondary | ICD-10-CM

## 2020-02-26 DIAGNOSIS — R351 Nocturia: Secondary | ICD-10-CM

## 2020-03-01 ENCOUNTER — Telehealth: Payer: Self-pay

## 2020-03-01 NOTE — Telephone Encounter (Signed)
Pt. Called to check on status of referral. Referral listed as sent to [facility]. The pt. Wanted to confirm a referral had been sent to a dermatologist and check on its status

## 2020-03-06 ENCOUNTER — Telehealth: Payer: Self-pay

## 2020-03-06 NOTE — Progress Notes (Signed)
Patient referred by Dr. Mitchel Honour, seen by me on 02/01/20, diagnostic PSG on 02/26/20.   Please call and notify the patient that the recent sleep study did not show any significant obstructive sleep apnea. He had mild, intermittent snoring, and mild REM sleep related sleep apnea when he slept in the supine position. CPAP therapy is not warranted; avoiding sleeping on the back is recommended. He can FU with his PCP as scheduled at this point. Star Age, MD, PhD [facility])

## 2020-03-06 NOTE — Telephone Encounter (Signed)
-----   Message from Star Age, MD sent at 03/06/2020  7:54 AM EDT ----- Patient referred by Dr. Mitchel Honour, seen by me on 02/01/20, diagnostic PSG on 02/26/20.   Please call and notify the patient that the recent sleep study did not show any significant obstructive sleep apnea. He had mild, intermittent snoring, and mild REM sleep related sleep apnea when he slept in the supine position. CPAP therapy is not warranted; avoiding sleeping on the back is recommended. He can FU with his PCP as scheduled at this point. Star Age, MD, PhD [facility])

## 2020-03-06 NOTE — Procedures (Signed)
PATIENT'S NAME:  Kevin Shepard, Kevin Shepard DOB:      January 27, 1946      MR#:    [id number]     DATE OF RECORDING: 02/26/2020 REFERRING M.D.:  Agustina Caroli, MD Study Performed:   Baseline Polysomnogram HISTORY: 74 year old right-handed gentleman with an underlying medical history of COPD, prior smoking, and hyperlipidemia, who reports chronic difficulty maintaining sleep. The patient endorsed the Epworth Sleepiness Scale at 5 points. The patient's weight 168 pounds with a height of 69 (inches), resulting in a BMI of 24.8 kg/m2. The patient's neck circumference measured 16 inches.  CURRENT MEDICATIONS: Breo ellipta, Crestor, Zovirax   PROCEDURE:  This is a multichannel digital polysomnogram utilizing the Somnostar 11.2 system.  Electrodes and sensors were applied and monitored per AASM Specifications.   EEG, EOG, Chin and Limb EMG, were sampled at 200 Hz.  ECG, Snore and Nasal Pressure, Thermal Airflow, Respiratory Effort, CPAP Flow and Pressure, Oximetry was sampled at 50 Hz. Digital video and audio were recorded.      BASELINE STUDY  Lights Out was at 22:37 and Lights On at 05:00.  Total recording time (TRT) was 383 minutes, with a total sleep time (TST) of 235.5 minutes.   The patient's sleep latency was 35.5 minutes, which is delayed. REM latency was 67 minutes, which is borderline reduced. The sleep efficiency was 61.5%, which is reduced.     SLEEP ARCHITECTURE: WASO (Wake after sleep onset) was 100 minutes with moderate sleep fragmentation noted. There were 21.5 minutes in Stage N1, 66.5 minutes Stage N2, 65 minutes Stage N3 and 82.5 minutes in Stage REM.  The percentage of Stage N1 was 9.1%, which is increased, Stage N2 was 28.2%, Stage N3 was 27.6%, which is increased, and Stage R (REM sleep) was 35.%, which is increased. The arousals were noted as: 46 were spontaneous, 6 were associated with PLMs, 9 were associated with respiratory events.  RESPIRATORY ANALYSIS:  There were a total of 19 respiratory  events:  0 obstructive apneas, 0 central apneas and 0 mixed apneas with a total of 0 apneas and an apnea index (AI) of 0 /hour. There were 19 hypopneas with a hypopnea index of 4.8 /hour. The patient also had 0 respiratory event related arousals (RERAs).      The total APNEA/HYPOPNEA INDEX (AHI) was 4.8/hour and the total RESPIRATORY DISTURBANCE INDEX was  4.8 /hour.  15 events occurred in REM sleep and 8 events in NREM. The REM AHI was  10.9 /hour, versus a non-REM AHI of 1.6. The patient spent 118.5 minutes of total sleep time in the supine position and 117 minutes in non-supine.. The supine AHI was 8.6 versus a non-supine AHI of 1.0.  OXYGEN SATURATION & C02: The Wake baseline 02 saturation was 95%, with the lowest being 86%. Time spent below 89% saturation equaled 12 minutes.  PERIODIC LIMB MOVEMENTS: The patient had a total of 42 Periodic Limb Movements.  The Periodic Limb Movement (PLM) index was 10.7 and the PLM Arousal index was 1.5/hour.  Audio and video analysis did not show any abnormal or unusual movements, behaviors, phonations or vocalizations. The patient took one bathroom break. Mild intermittent snoring was noted. The EKG was in keeping with normal sinus rhythm (NSR).  Post-study, the patient indicated that sleep was the same as usual.   IMPRESSION:  1. Primary Snoring 2. Dysfunctions associated with sleep stages or arousal from sleep  RECOMMENDATIONS:  1. This study does not demonstrate any significant obstructive or central sleep disordered breathing with  the exception of mild, intermittent snoring and mild REM related sleep apnea, mostly noted during supine REM. The overall AHI was less than 5/hour; positive airway pressure treatment is not warranted. Avoidance of the supine sleep position will likely help alleviate his supine REM related OSA. This study does not support an intrinsic sleep disorder as a cause of the patient's symptoms. Other causes, including circadian rhythm  disturbances, an underlying mood disorder, medication effect and/or an underlying medical problem cannot be ruled out. 2. This study shows sleep fragmentation and abnormal sleep stage percentages; these are nonspecific findings and per se do not signify an intrinsic sleep disorder or a cause for the patient's sleep-related symptoms. Causes include (but are not limited to) the first night effect of the sleep study, circadian rhythm disturbances, medication effect or an underlying mood disorder or medical problem.  3. The patient should be cautioned not to drive, work at heights, or operate dangerous or heavy equipment when tired or sleepy. Review and reiteration of good sleep hygiene measures should be pursued with any patient. 4. The patient will be advised to follow up with the referring provider, who will be notified of the test results.  I certify that I have reviewed the entire raw data recording prior to the issuance of this report in accordance with the Standards of Accreditation of the American Academy of Sleep Medicine (AASM)   Star Age, MD, PhD Diplomat, American Board of Neurology and Sleep Medicine (Neurology and Sleep Medicine)

## 2020-03-06 NOTE — Telephone Encounter (Signed)
I contacted the pt and advised of results. Pt verbalized understanding and had no questions/concerns at this time.

## 2020-04-11 ENCOUNTER — Ambulatory Visit (INDEPENDENT_AMBULATORY_CARE_PROVIDER_SITE_OTHER): Payer: Medicare Other | Admitting: Family Medicine

## 2020-04-11 ENCOUNTER — Other Ambulatory Visit: Payer: Self-pay

## 2020-04-11 DIAGNOSIS — L570 Actinic keratosis: Secondary | ICD-10-CM | POA: Diagnosis not present

## 2020-04-11 DIAGNOSIS — I2510 Atherosclerotic heart disease of native coronary artery without angina pectoris: Secondary | ICD-10-CM

## 2020-04-11 DIAGNOSIS — L821 Other seborrheic keratosis: Secondary | ICD-10-CM | POA: Diagnosis not present

## 2020-04-11 NOTE — Assessment & Plan Note (Signed)
Believed to be Seborrheic Keratoses given lesion appearance. - Cryosurgically froze lesions on right forearm, right upper leg, right upper eyelid, right ear helix - Follow-up in few months if lesions do not resolve or return after resolving

## 2020-04-11 NOTE — Assessment & Plan Note (Signed)
Possible Actinic Keratoses located on scalp given lesion appearance.  - Discussed with patient options of monitoring, prescribing use of hydrocortisone cream on lesions, or cryogenic freezing of lesions - Patient wished to have lesions cryogenically frozen, froze 3 lesions on top of scalp - Follow-up in few months if lesions do not resolve or return after resolving

## 2020-04-11 NOTE — Progress Notes (Addendum)
    SUBJECTIVE:   CHIEF COMPLAINT / HPI: Skin spots  Kevin Shepard is a 74 y o male in clinic due to spots.  He indicates the most noticeable spots are located on his eyelid and ear but he has several others on arms back and legs.  He indicates all of these have been present for several years and have been growing very slowly.  He denies any itchiness or irritation with these.  He also denies any color change in these over time.  He indicates he lived mos of his life in [place] where he received lot of sun exposure even though he worked indoors.  He says recently he is not in the sun very much and wears sunscreen and a hat when he is.   PERTINENT  PMH / PSH:   OBJECTIVE:   BP 110/60   Pulse 63   Ht 5\' 9"  (1.753 m)   Wt 166 lb (75.3 kg)   SpO2 99%   BMI 24.51 kg/m    Physical Exam Constitutional:      General: He is not in acute distress.    Comments: Patient appears tan  HENT:     Head: Normocephalic.  Skin:    Comments: Patient has multiple round, well circumscrined hyperpigmented stuck on lesions on arms, back and legs all less than 0.3 cm.  [place] also has one lesion on right eye 0.2 cm in diameter, and one on his left ear 0.5 cm in diameter.  Patient also has 3 flat, non-blanching erythematous flat  lesions measuring 0.3 cm in diameter on top of head.  Lesions pictured in chart.  Neurological:     Mental Status: He is alert.     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 - Cryosurgically froze lesions on right forearm, right upper leg, right upper eyelid, right ear helix - Follow-up in few months if lesions do not resolve or return after resolving    Actinic keratoses Possible Actinic Keratoses located on scalp given lesion appearance.  - Discussed with patient options of monitoring, prescribing use of hydrocortisone cream on lesions, or cryogenic freezing of lesions - Patient wished to have lesions  cryogenically frozen, froze 3 lesions on top of scalp - Follow-up in few months if lesions do not resolve or return after resolving    Kevin Shepard, Kevin Shepard   I was present during history physical and procedure.  Agree with plan Kevin Shepard

## 2020-04-11 NOTE — Patient Instructions (Signed)
It was good to see you today.  Thank you for coming in.  I think you have Benign Seborheic Keratoses and Actinic Keratoses.  We consented you and froze a spot on your leg, eye, ear, and 3 on your head.  You should be better in a few months.  If the lesions do not resolve within a few months, or you develop new lesions.  Please come back and see [place].    [place], Delora Fuel, MD

## 2020-04-25 DIAGNOSIS — L309 Dermatitis, unspecified: Secondary | ICD-10-CM | POA: Diagnosis not present

## 2020-06-06 ENCOUNTER — Ambulatory Visit (INDEPENDENT_AMBULATORY_CARE_PROVIDER_SITE_OTHER): Payer: Medicare Other | Admitting: Family Medicine

## 2020-06-06 ENCOUNTER — Other Ambulatory Visit: Payer: Self-pay

## 2020-06-06 VITALS — BP 105/60 | HR 54 | Ht 69.0 in | Wt 167.8 lb

## 2020-06-06 DIAGNOSIS — L821 Other seborrheic keratosis: Secondary | ICD-10-CM

## 2020-06-06 NOTE — Patient Instructions (Signed)
It was great seeing you today in the clinic! Today we used cryotherapy to your right eyelid and ear. Please utilize the wound care instructions attached.  Return if you experience infection, fever, or other new or concerning symptoms.   Take care!   Shary Key, D.O. [facility] [place], PGY-1   Cryosurgery for Skin Conditions Cryosurgery, also called cryotherapy, is the use of extremely cold liquid (liquid nitrogen) to freeze and remove abnormal or diseased tissue. Cryosurgery may be used to remove certain growths on the skin, such as:  Warts.  Skin sores that could turn into cancer (precancerous skin lesions or actinic keratoses).  Some skin cancers. Cryosurgery usually takes a few minutes, and it can be done in your health care provider's office. Tell a health care provider about:  Any allergies you have.  All medicines you are taking, including vitamins, herbs, eye drops, creams, and over-the-counter medicines.  Any problems you or family members have had with anesthetic medicines.  Any blood disorders you have.  Any surgeries you have had.  Any medical conditions you have.  Whether you are pregnant or may be pregnant. What are the risks? Generally, this is a safe procedure. However, problems may occur, including:  Infection.  Bleeding.  Scarring.  Changes in skin color (lighter or darker than normal skin tone).  Swelling.  Hair loss in the treated area.  Damage to nearby structures or organs, such as nerve damage and loss of feeling. This is rare. What happens before the procedure? No specific preparation is needed for this procedure. Your health care provider will describe the procedure and will discuss the benefits and risks of the procedure with you. What happens during the procedure?   Your procedure will be performed using one of the following methods: ? Your health care provider may apply a device (probe) to the skin. The  probe has liquid nitrogen flowing through it to cool it down. The probe will be applied to the skin until the skin is frozen and destroyed. ? Your health care provider may apply liquid nitrogen to the skin with a swab or by spraying it on the skin until the skin is frozen and destroyed.  The treated area may be covered with a bandage (dressing). These procedures may vary among health care providers and clinics. What can I expect after procedure? After your procedure, it is common to have redness, swelling, and a blister that forms over the treated area. The blister may contain a small amount of blood. You may also have some mild stinging or a burning sensation that will resolve. If a blister forms, it will break open on its own after about 2-4 weeks, leaving a scab. Then the treated area will heal. After healing, there is usually little or no scarring. Follow these instructions at home: Caring for the treated area   Follow instructions from your health care provider about how to take care of the treated area. If you have a dressing, make sure you: ? Wash your hands with soap and water for at least 20 seconds before and after you change your dressing. If soap and water are not available, use hand sanitizer. ? Change your dressing as told by your health care provider. ? Keep the dressing and the treated area clean and dry. If the dressing gets wet, change it right away. ? Clean the treated area with soap and water. ? Keep the area covered with a dressing until it heals, or for as  long as told by your health care provider.  Check the treated area every day for signs of infection. Check for: ? More redness, swelling, or pain. ? More fluid or blood. ? Warmth. ? Pus or a bad smell.  If a blister forms, do not pick at your blister or try to break it open. Doing this can cause infection and scarring.  Do not apply any medicine, cream, or lotion to the treated area unless directed by your health  care provider. General instructions  Take over-the-counter and prescription medicines only as told by your health care provider.  Do not use any products that contain nicotine or tobacco, such as cigarettes, e-cigarettes, and chewing tobacco. These can delay healing. If you need help quitting, ask your health care provider.  Do not take baths, swim, use a hot tub, hand-wash dishes, or otherwise soak the treated area until your health care provider approves. Ask your health care provider if you may take showers. You may only be allowed to take sponge baths.  Keep all follow-up visits as told by your health care provider. This is important. Contact a health care provider if:  You have more redness, swelling, or pain around the treated area.  You have more fluid or blood coming from the treated area.  The treated area feels warm to the touch.  You have pus or a bad smell coming from the treated area.  Your blister becomes large and painful. Get help right away if:  You have a fever and have redness spreading from the treated area. Summary  Cryosurgery, also called cryotherapy, is the use of extreme cold (liquid nitrogen) to freeze and remove abnormal growths or diseased tissue.  Cryosurgery usually takes a few minutes, and it can be done in your health care provider's office.  Generally, this is a safe procedure that requires no specific preparation beforehand.  There are two different methods for performing cryosurgery. One method involves using a device (probe) to freeze the growth, and the other method involves applying liquid nitrogen directly to the growth.  After treatment with cryotherapy, follow care instructions as provided by your health care provider. Watch for signs of infection. If a blister forms, do not pick at it or try to break it open. This information is not intended to replace advice given to you by your health care provider. Make sure you discuss any questions you  have with your health care provider. Document Revised: 05/10/2019 Document Reviewed: 05/10/2019 Elsevier Patient Education  [place].

## 2020-06-06 NOTE — Progress Notes (Signed)
    SUBJECTIVE:   CHIEF COMPLAINT / HPI:   Patient is here for follow up on seborrheic keratosis of his right upper eyelid and right helix of ear. His previous lesions of his arm, leg, and scalp have all resolved and without complications. Patient would like repeat cryotherapy of his eyelid and ear. Patient denies any other concerns or complaints.   PERTINENT  PMH / PSH:  Seborrheic keratoses, actinic keratoses OBJECTIVE:   BP 105/60   Pulse (!) 54   Ht 5\' 9"  (1.753 m)   Wt 167 lb 12.8 oz (76.1 kg)   SpO2 97%   BMI 24.78 kg/m   Physical Exam Constitutional:      General: He is not in acute distress.    Appearance: Normal appearance.  Pulmonary:     Effort: Pulmonary effort is normal.     Comments: Patient breathing comfortably on room air  Skin:    General: Skin is warm and dry.     Comments: Brown waxy appearing lesions on his right eyelid and helix     ASSESSMENT/PLAN:   No problem-specific Assessment & Plan notes found for this encounter.  Seborrheic keratosis Patient returns with brown waxy lesions on his right eyelid and right helix of ear that were not resolved with the last treatment on 7/8. Written consent was given, and patient received cryotherapy to both areas and tolerated the procedure well. Wound care and return precautions were given and he verbalized understanding.    [place]   I was present during history and procedure.  Agree with above Lind Covert

## 2020-06-26 ENCOUNTER — Encounter: Payer: Self-pay | Admitting: Emergency Medicine

## 2020-06-26 ENCOUNTER — Other Ambulatory Visit: Payer: Self-pay

## 2020-06-26 ENCOUNTER — Ambulatory Visit (INDEPENDENT_AMBULATORY_CARE_PROVIDER_SITE_OTHER): Payer: Medicare Other | Admitting: Emergency Medicine

## 2020-06-26 VITALS — BP 142/76 | HR 67 | Temp 98.0°F | Ht 67.0 in | Wt 166.0 lb

## 2020-06-26 DIAGNOSIS — L723 Sebaceous cyst: Secondary | ICD-10-CM

## 2020-06-26 DIAGNOSIS — I2510 Atherosclerotic heart disease of native coronary artery without angina pectoris: Secondary | ICD-10-CM

## 2020-06-26 NOTE — Patient Instructions (Addendum)
   If you have lab work done today you will be contacted with your lab results within the next 2 weeks.  If you have not heard from us then please contact us. The fastest way to get your results is to register for My Chart.   IF you received an x-ray today, you will receive an invoice from [facility]. Please contact [facility] at [phone] with questions or concerns regarding your invoice.   IF you received labwork today, you will receive an invoice from [facility]. Please contact [facility] at [phone] with questions or concerns regarding your invoice.   Our billing staff will not be able to assist you with questions regarding bills from these companies.  You will be contacted with the lab results as soon as they are available. The fastest way to get your results is to activate your My Chart account. Instructions are located on the last page of this paperwork. If you have not heard from us regarding the results in 2 weeks, please contact this office.     Health Maintenance After Age 65 After age 65, you are at a higher risk for certain long-term diseases and infections as well as injuries from falls. Falls are a major cause of broken bones and head injuries in people who are older than age 74. Getting regular preventive care can help to keep you healthy and well. Preventive care includes getting regular testing and making lifestyle changes as recommended by your health care provider. Talk with your health care provider about:  Which screenings and tests you should have. A screening is a test that checks for a disease when you have no symptoms.  A diet and exercise plan that is right for you. What should I know about screenings and tests to prevent falls? Screening and testing are the best ways to find a health problem early. Early diagnosis and treatment give you the best chance of managing medical conditions that are common after age 74. Certain conditions and  lifestyle choices may make you more likely to have a fall. Your health care provider may recommend:  Regular vision checks. Poor vision and conditions such as cataracts can make you more likely to have a fall. If you wear glasses, make sure to get your prescription updated if your vision changes.  Medicine review. Work with your health care provider to regularly review all of the medicines you are taking, including over-the-counter medicines. Ask your health care provider about any side effects that may make you more likely to have a fall. Tell your health care provider if any medicines that you take make you feel dizzy or sleepy.  Osteoporosis screening. Osteoporosis is a condition that causes the bones to get weaker. This can make the bones weak and cause them to break more easily.  Blood pressure screening. Blood pressure changes and medicines to control blood pressure can make you feel dizzy.  Strength and balance checks. Your health care provider may recommend certain tests to check your strength and balance while standing, walking, or changing positions.  Foot health exam. Foot pain and numbness, as well as not wearing proper footwear, can make you more likely to have a fall.  Depression screening. You may be more likely to have a fall if you have a fear of falling, feel emotionally low, or feel unable to do activities that you used to do.  Alcohol use screening. Using too much alcohol can affect your balance and may make you more likely to   have a fall. What actions can I take to lower my risk of falls? General instructions  Talk with your health care provider about your risks for falling. Tell your health care provider if: ? You fall. Be sure to tell your health care provider about all falls, even ones that seem minor. ? You feel dizzy, sleepy, or off-balance.  Take over-the-counter and prescription medicines only as told by your health care provider. These include any  supplements.  Eat a healthy diet and maintain a healthy weight. A healthy diet includes low-fat dairy products, low-fat (lean) meats, and fiber from whole grains, beans, and lots of fruits and vegetables. Home safety  Remove any tripping hazards, such as rugs, cords, and clutter.  Install safety equipment such as grab bars in bathrooms and safety rails on stairs.  Keep rooms and walkways well-lit. Activity   Follow a regular exercise program to stay fit. This will help you maintain your balance. Ask your health care provider what types of exercise are appropriate for you.  If you need a cane or walker, use it as recommended by your health care provider.  Wear supportive shoes that have nonskid soles. Lifestyle  Do not drink alcohol if your health care provider tells you not to drink.  If you drink alcohol, limit how much you have: ? 0-1 drink a day for women. ? 0-2 drinks a day for men.  Be aware of how much alcohol is in your drink. In the [place] one drink equals one typical bottle of beer (12 oz), one-half glass of wine (5 oz), or one shot of hard liquor (1 oz).  Do not use any products that contain nicotine or tobacco, such as cigarettes and e-cigarettes. If you need help quitting, ask your health care provider. Summary  Having a healthy lifestyle and getting preventive care can help to protect your health and wellness after age 74.  Screening and testing are the best way to find a health problem early and help you avoid having a fall. Early diagnosis and treatment give you the best chance for managing medical conditions that are more common for people who are older than age 74.  Falls are a major cause of broken bones and head injuries in people who are older than age 74. Take precautions to prevent a fall at home.  Work with your health care provider to learn what changes you can make to improve your health and wellness and to prevent falls. This information is not intended  to replace advice given to you by your health care provider. Make sure you discuss any questions you have with your health care provider. Document Revised: 01/12/2019 Document Reviewed: 08/04/2017 Elsevier Patient Education  2020 [employer] Inc.  

## 2020-06-26 NOTE — Progress Notes (Signed)
Kevin Shepard 74 y.o.   Chief Complaint  Patient presents with  . mass left scrotum    2-3 weeks ago it was noticed. (soarness)    HISTORY OF PRESENT ILLNESS: This is a 74 y.o. male complaining of small lump to right scrotal area noticed 2 weeks ago. No other complaints or medical concerns today.  HPI   Prior to Admission medications   Medication Sig Start Date End Date Taking? Authorizing Provider  eszopiclone (LUNESTA) 2 MG TABS tablet TAKE 1 TABLET BY MOUTH AT BEDTIME AS NEEDED FOR SLEEP 02/12/20  Yes Inanna Telford, Ines Bloomer, MD  Eszopiclone 3 MG TABS Take 1 tablet (3 mg total) by mouth at bedtime. Take immediately before bedtime as needed. 01/23/20  Yes Jackquelyn Sundberg, Ines Bloomer, MD  rosuvastatin (CRESTOR) 10 MG tablet Take 1 tablet (10 mg total) by mouth daily. 01/23/20  Yes Cherly Erno, Ines Bloomer, MD  acyclovir (ZOVIRAX) 200 MG capsule Take 1 capsule (200 mg total) by mouth 4 (four) times daily for 7 days. At time of flare-up. Patient not taking: Reported on 06/26/2020 06/07/18 06/14/18  Horald Pollen, MD  fluticasone furoate-vilanterol (BREO ELLIPTA) 200-25 MCG/INH AEPB Inhale 1 puff into the lungs daily. Patient not taking: Reported on 06/26/2020 02/08/20   Horald Pollen, MD    No Known Allergies  Patient Active Problem List   Diagnosis Date Noted  . 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 COPD  GOLD II/ Group A 06/02/2018  . Left upper lobe pulmonary nodule 06/02/2018  . Former smoker 04/06/2018  . Alcohol use 01/10/2018  . 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 Chronic bilateral low back pain without sciatica 01/10/2018  . History of tobacco use 01/10/2018  . History of back surgery 01/10/2018    Past Medical History:  Diagnosis Date  . COPD (chronic obstructive pulmonary disease) ([place])   . Hyperlipidemia     Past Surgical History:  Procedure Laterality Date  . APPENDECTOMY     per patient 1950's  . CESAREAN SECTION   03/2017  . FRACTURE SURGERY  1974   right clavicle  . LUMBAR SPINE SURGERY  2018  . SPINE SURGERY  10/2016  . TONSILLECTOMY      Social History   Socioeconomic History  . Marital status: Married    Spouse name: Not on file  . Number of children: Not on file  . Years of education: Not on file  . Highest education level: Not on file  Occupational History  . Occupation: Merchandiser, retail trading  Tobacco Use  . Smoking status: Former Smoker    Packs/day: 1.00    Years: 40.00    Pack years: 40.00    Quit date: 10/04/2017    Years since quitting: 2.7  . Smokeless tobacco: Never Used  Substance and Sexual Activity  . Alcohol use: Yes  . Drug use: Never  . Sexual activity: Yes    Partners: Female  Other Topics Concern  . Not on file  Social History Narrative  . Not on file   Social Determinants of Health   Financial Resource Strain:   . Difficulty of Paying Living Expenses: Not on file  Food Insecurity:   . Worried About Charity fundraiser in the Last Year: Not on file  . Ran Out of Food in the Last Year: Not on file  Transportation Needs:   . Lack of Transportation (Medical): Not on file  . Lack of Transportation (Non-Medical): Not on file  Physical Activity:   .  Days of Exercise per Week: Not on file  . Minutes of Exercise per Session: Not on file  Stress:   . Feeling of Stress : Not on file  Social Connections:   . Frequency of Communication with Friends and Family: Not on file  . Frequency of Social Gatherings with Friends and Family: Not on file  . Attends Religious Services: Not on file  . Active Member of Clubs or Organizations: Not on file  . Attends Archivist Meetings: Not on file  . Marital Status: Not on file  Intimate Partner Violence:   . Fear of Current or Ex-Partner: Not on file  . Emotionally Abused: Not on file  . Physically Abused: Not on file  . Sexually Abused: Not on file    Family History  Problem Relation Age of Onset  . Cancer  Mother   . Early death Father   . Heart disease Father 25       rheumatic heart disease  . Hyperlipidemia Sister      Review of Systems  Constitutional: Negative.  Negative for chills and fever.  HENT: Negative for congestion and sore throat.   Respiratory: Negative.  Negative for cough and shortness of breath.   Cardiovascular: Negative.  Negative for chest pain and palpitations.  Gastrointestinal: Negative for abdominal pain, diarrhea, nausea and vomiting.  Genitourinary: Negative.  Negative for dysuria and hematuria.  Skin: Negative.   Neurological: Negative for dizziness and headaches.  All other systems reviewed and are negative.    Today's Vitals   06/26/20 0845  BP: (!) 142/76  Pulse: 67  Temp: 98 F (36.7 C)  TempSrc: Temporal  SpO2: 97%  Weight: 166 lb (75.3 kg)  Height: 5\' 7"  (1.702 m)   Body mass index is 26 kg/m.   Physical Exam Vitals reviewed.  Constitutional:      Appearance: Normal appearance.  HENT:     Head: Normocephalic.  Eyes:     Extraocular Movements: Extraocular movements intact.     Pupils: Pupils are equal, round, and reactive to light.  Cardiovascular:     Rate and Rhythm: Normal rate.  Pulmonary:     Effort: Pulmonary effort is normal.  Abdominal:     Hernia: There is no hernia in the left inguinal area or right inguinal area.  Genitourinary:    Penis: Normal.      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     Epididymis:     Left: Normal.     Comments: Small sebaceous cyst on skin of right scrotum.  Not infected. Musculoskeletal:        General: Normal range of motion.     Cervical back: Normal range of motion.  Lymphadenopathy:     Lower Body: No right inguinal adenopathy. No left inguinal adenopathy.  Neurological:     General: No focal deficit present.     Mental Status: He is alert and oriented to person, place, and time.  Psychiatric:        Mood and Affect: Mood normal.        Behavior: Behavior normal.       ASSESSMENT & PLAN: Kevin Shepard was seen today for mass left scrotum.  Diagnoses and all orders for this visit:  Sebaceous cyst of scrotum    Patient Instructions       If you have lab work done today you will be contacted with your lab results within the next 2 weeks.  If you have not heard from [place] then please contact us. The fastest way to get your results is to register for My Chart.   IF you received an x-ray today, you will receive an invoice from [facility]. Please contact [facility] at [phone] with questions or concerns regarding your invoice.   IF you received labwork today, you will receive an invoice from [facility]. Please contact [facility] at [phone] with questions or concerns regarding your invoice.   Our billing staff will not be able to assist you with questions regarding bills from these companies.  You will be contacted with the lab results as soon as they are available. The fastest way to get your results is to activate your My Chart account. Instructions are located on the last page of this paperwork. If you have not heard from [place] regarding the results in 2 weeks, please contact this office.      Health Maintenance After Age 23 After age 108, you are at a higher risk for certain long-term diseases and infections as well as injuries from falls. Falls are a major cause of broken bones and head injuries in people who are older than age 69. Getting regular preventive care can help to keep you healthy and well. Preventive care includes getting regular testing and making lifestyle changes as recommended by your health care provider. Talk with your health care provider about:  Which screenings and tests you should have. A screening is a test that checks for a disease when you have no symptoms.  A diet and exercise plan that is right for you. What should I know about screenings and tests to prevent falls? Screening and testing are the best  ways to find a health problem early. Early diagnosis and treatment give you the best chance of managing medical conditions that are common after age 61. Certain conditions and lifestyle choices may make you more likely to have a fall. Your health care provider may recommend:  Regular vision checks. Poor vision and conditions such as cataracts can make you more likely to have a fall. If you wear glasses, make sure to get your prescription updated if your vision changes.  Medicine review. Work with your health care provider to regularly review all of the medicines you are taking, including over-the-counter medicines. Ask your health care provider about any side effects that may make you more likely to have a fall. Tell your health care provider if any medicines that you take make you feel dizzy or sleepy.  Osteoporosis screening. Osteoporosis is a condition that causes the bones to get weaker. This can make the bones weak and cause them to break more easily.  Blood pressure screening. Blood pressure changes and medicines to control blood pressure can make you feel dizzy.  Strength and balance checks. Your health care provider may recommend certain tests to check your strength and balance while standing, walking, or changing positions.  Foot health exam. Foot pain and numbness, as well as not wearing proper footwear, can make you more likely to have a fall.  Depression screening. You may be more likely to have a fall if you have a fear of falling, feel emotionally low, or feel unable to do activities that you used to do.  Alcohol use screening. Using too much alcohol can affect your balance and may make you more likely to have a fall. What actions can I take to lower my risk of falls? General instructions  Talk with your health care provider  about your risks for falling. Tell your health care provider if: ? You fall. Be sure to tell your health care provider about all falls, even ones that seem  minor. ? You feel dizzy, sleepy, or off-balance.  Take over-the-counter and prescription medicines only as told by your health care provider. These include any supplements.  Eat a healthy diet and maintain a healthy weight. A healthy diet includes low-fat dairy products, low-fat (lean) meats, and fiber from whole grains, beans, and lots of fruits and vegetables. Home safety  Remove any tripping hazards, such as rugs, cords, and clutter.  Install safety equipment such as grab bars in bathrooms and safety rails on stairs.  Keep rooms and walkways well-lit. Activity   Follow a regular exercise program to stay fit. This will help you maintain your balance. Ask your health care provider what types of exercise are appropriate for you.  If you need a cane or walker, use it as recommended by your health care provider.  Wear supportive shoes that have nonskid soles. Lifestyle  Do not drink alcohol if your health care provider tells you not to drink.  If you drink alcohol, limit how much you have: ? 0-1 drink a day for women. ? 0-2 drinks a day for men.  Be aware of how much alcohol is in your drink. In the [place] one drink equals one typical bottle of beer (12 oz), one-half glass of wine (5 oz), or one shot of hard liquor (1 oz).  Do not use any products that contain nicotine or tobacco, such as cigarettes and e-cigarettes. If you need help quitting, ask your health care provider. Summary  Having a healthy lifestyle and getting preventive care can help to protect your health and wellness after age 4.  Screening and testing are the best way to find a health problem early and help you avoid having a fall. Early diagnosis and treatment give you the best chance for managing medical conditions that are more common for people who are older than age 85.  Falls are a major cause of broken bones and head injuries in people who are older than age 99. Take precautions to prevent a fall at  home.  Work with your health care provider to learn what changes you can make to improve your health and wellness and to prevent falls. This information is not intended to replace advice given to you by your health care provider. Make sure you discuss any questions you have with your health care provider. Document Revised: 01/12/2019 Document Reviewed: 08/04/2017 Elsevier Patient Education  2020 [employer]      Agustina Caroli, MD [facility]

## 2020-07-23 ENCOUNTER — Ambulatory Visit: Payer: Medicare Other | Admitting: Emergency Medicine

## 2020-07-25 DIAGNOSIS — Z23 Encounter for immunization: Secondary | ICD-10-CM | POA: Diagnosis not present

## 2020-07-31 ENCOUNTER — Ambulatory Visit (INDEPENDENT_AMBULATORY_CARE_PROVIDER_SITE_OTHER): Payer: Medicare Other | Admitting: Emergency Medicine

## 2020-07-31 ENCOUNTER — Encounter: Payer: Self-pay | Admitting: Emergency Medicine

## 2020-07-31 ENCOUNTER — Other Ambulatory Visit: Payer: Self-pay

## 2020-07-31 VITALS — BP 131/79 | HR 46 | Temp 97.9°F | Resp 16 | Ht 69.0 in | Wt 167.0 lb

## 2020-07-31 DIAGNOSIS — I700 Atherosclerosis of aorta: Secondary | ICD-10-CM | POA: Diagnosis not present

## 2020-07-31 DIAGNOSIS — J449 Chronic obstructive pulmonary disease, unspecified: Secondary | ICD-10-CM | POA: Diagnosis not present

## 2020-07-31 DIAGNOSIS — I2510 Atherosclerotic heart disease of native coronary artery without angina pectoris: Secondary | ICD-10-CM | POA: Diagnosis not present

## 2020-07-31 DIAGNOSIS — E785 Hyperlipidemia, unspecified: Secondary | ICD-10-CM | POA: Diagnosis not present

## 2020-07-31 DIAGNOSIS — G4700 Insomnia, unspecified: Secondary | ICD-10-CM | POA: Diagnosis not present

## 2020-07-31 MED ORDER — ESZOPICLONE 2 MG PO TABS: 2.0000 mg | ORAL_TABLET | Freq: Every evening | ORAL | 1 refills | Status: DC | PRN

## 2020-07-31 MED ORDER — ESZOPICLONE 3 MG PO TABS: 3.0000 mg | ORAL_TABLET | Freq: Every day | ORAL | 1 refills | Status: DC

## 2020-07-31 NOTE — Progress Notes (Signed)
Kevin Shepard 74 y.o.   Chief Complaint  Patient presents with  . Hyperlipidemia    follow up 6 month cholesterol medication  . Medication Refill    Pend    HISTORY OF PRESENT ILLNESS: This is a 74 y.o. male here for 34-month follow-up on dyslipidemia and blood work.  Presently on rosuvastatin 10 mg daily. Has history of COPD but is not taking Breo Ellipta inhaler due to vocal cord irritation.  Doing well however. Requesting refill on Lunesta.  Has history of chronic insomnia and it works well for him. No other complaints or medical concerns today.  Overall doing well.  HPI   Prior to Admission medications   Medication Sig Start Date End Date Taking? Authorizing Provider  eszopiclone (LUNESTA) 2 MG TABS tablet TAKE 1 TABLET BY MOUTH AT BEDTIME AS NEEDED FOR SLEEP 02/12/20  Yes Tobi Leinweber, Ines Bloomer, MD  Eszopiclone 3 MG TABS Take 1 tablet (3 mg total) by mouth at bedtime. Take immediately before bedtime as needed. 01/23/20  Yes Bilan Tedesco, Ines Bloomer, MD  rosuvastatin (CRESTOR) 10 MG tablet Take 1 tablet (10 mg total) by mouth daily. 01/23/20  Yes Ethanael Veith, Ines Bloomer, MD  acyclovir (ZOVIRAX) 200 MG capsule Take 1 capsule (200 mg total) by mouth 4 (four) times daily for 7 days. At time of flare-up. Patient not taking: Reported on 06/26/2020 06/07/18 06/14/18  Horald Pollen, MD  fluticasone furoate-vilanterol (BREO ELLIPTA) 200-25 MCG/INH AEPB Inhale 1 puff into the lungs daily. Patient not taking: Reported on 07/31/2020 02/08/20   Horald Pollen, MD    No Known Allergies  Patient Active Problem List   Diagnosis Date Noted  . 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 COPD  GOLD II/ Group A 06/02/2018  . Left upper lobe pulmonary nodule 06/02/2018  . Former smoker 04/06/2018  . Alcohol use 01/10/2018  . 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 Chronic bilateral low back pain without sciatica 01/10/2018  . History of tobacco use 01/10/2018  .  History of back surgery 01/10/2018    Past Medical History:  Diagnosis Date  . COPD (chronic obstructive pulmonary disease) ([place])   . Hyperlipidemia     Past Surgical History:  Procedure Laterality Date  . APPENDECTOMY     per patient 1950's  . CESAREAN SECTION  03/2017  . FRACTURE SURGERY  1974   right clavicle  . LUMBAR SPINE SURGERY  2018  . SPINE SURGERY  10/2016  . TONSILLECTOMY      Social History   Socioeconomic History  . Marital status: Married    Spouse name: Not on file  . Number of children: Not on file  . Years of education: Not on file  . Highest education level: Not on file  Occupational History  . Occupation: Merchandiser, retail trading  Tobacco Use  . Smoking status: Former Smoker    Packs/day: 1.00    Years: 40.00    Pack years: 40.00    Quit date: 10/04/2017    Years since quitting: 2.8  . Smokeless tobacco: Never Used  Substance and Sexual Activity  . Alcohol use: Yes  . Drug use: Never  . Sexual activity: Yes    Partners: Female  Other Topics Concern  . Not on file  Social History Narrative  . Not on file   Social Determinants of Health   Financial Resource Strain:   . Difficulty of Paying Living Expenses: Not on file  Food Insecurity:   . Worried About  Running Out of Food in the Last Year: Not on file  . Ran Out of Food in the Last Year: Not on file  Transportation Needs:   . Lack of Transportation (Medical): Not on file  . Lack of Transportation (Non-Medical): Not on file  Physical Activity:   . Days of Exercise per Week: Not on file  . Minutes of Exercise per Session: Not on file  Stress:   . Feeling of Stress : Not on file  Social Connections:   . Frequency of Communication with Friends and Family: Not on file  . Frequency of Social Gatherings with Friends and Family: Not on file  . Attends Religious Services: Not on file  . Active Member of Clubs or Organizations: Not on file  . Attends Archivist Meetings: Not on file    . Marital Status: Not on file  Intimate Partner Violence:   . Fear of Current or Ex-Partner: Not on file  . Emotionally Abused: Not on file  . Physically Abused: Not on file  . Sexually Abused: Not on file    Family History  Problem Relation Age of Onset  . Cancer Mother   . Early death Father   . Heart disease Father 48       rheumatic heart disease  . Hyperlipidemia Sister      Review of Systems  Constitutional: Negative.  Negative for chills and fever.  HENT: Negative.  Negative for congestion and sore throat.   Respiratory: Negative.  Negative for cough and shortness of breath.   Cardiovascular: Negative.  Negative for chest pain and palpitations.  Gastrointestinal: Negative.  Negative for abdominal pain, diarrhea, nausea and vomiting.  Genitourinary: Negative.  Negative for dysuria.  Musculoskeletal: Negative.  Negative for myalgias.  Skin: Negative.  Negative for rash.  Neurological: Negative.  Negative for dizziness and headaches.  All other systems reviewed and are negative.   Today's Vitals   07/31/20 0823  BP: 131/79  Pulse: (!) 46  Resp: 16  Temp: 97.9 F (36.6 C)  TempSrc: Temporal  SpO2: 95%  Weight: 167 lb (75.8 kg)  Height: 5\' 9"  (1.753 m)   Body mass index is 24.66 kg/m. Wt Readings from Last 3 Encounters:  07/31/20 167 lb (75.8 kg)  06/26/20 166 lb (75.3 kg)  06/06/20 167 lb 12.8 oz (76.1 kg)    Physical Exam Vitals reviewed.  Constitutional:      Appearance: Normal appearance.  HENT:     Head: Normocephalic.  Eyes:     Extraocular Movements: Extraocular movements intact.     Pupils: Pupils are equal, round, and reactive to light.  Cardiovascular:     Rate and Rhythm: Normal rate and regular rhythm.     Pulses: Normal pulses.     Heart sounds: Normal heart sounds.  Pulmonary:     Effort: Pulmonary effort is normal.     Breath sounds: Normal breath sounds.  Musculoskeletal:     Cervical back: Normal range of motion and neck supple.  No tenderness.  Lymphadenopathy:     Cervical: No cervical adenopathy.  Skin:    General: Skin is warm and dry.     Capillary Refill: Capillary refill takes less than 2 seconds.  Neurological:     General: No focal deficit present.     Mental Status: He is alert and oriented to person, place, and time.  Psychiatric:        Mood and Affect: Mood normal.  Behavior: Behavior normal.      ASSESSMENT & PLAN: Clinically stable overall doing well.  No medical concerns identified during this visit.  Continue present medications.  Follow-up in 6 months. [place] was seen today for hyperlipidemia and medication refill.  Diagnoses and all orders for this visit:  Dyslipidemia -     Lipid panel -     Comprehensive metabolic panel  Insomnia, unspecified type -     eszopiclone (LUNESTA) 2 MG TABS tablet; Take 1 tablet (2 mg total) by mouth at bedtime as needed. for sleep -     Eszopiclone 3 MG TABS; Take 1 tablet (3 mg total) by mouth at bedtime. Take immediately before bedtime as needed.  COPD  GOLD II/ Group A  Atherosclerosis of aorta [facility])    Patient Instructions       If you have lab work done today you will be contacted with your lab results within the next 2 weeks.  If you have not heard from [place] then please contact us. The fastest way to get your results is to register for My Chart.   IF you received an x-ray today, you will receive an invoice from [facility]. Please contact [facility] at [phone] with questions or concerns regarding your invoice.   IF you received labwork today, you will receive an invoice from [facility]. Please contact [facility] at [phone] with questions or concerns regarding your invoice.   Our billing staff will not be able to assist you with questions regarding bills from these companies.  You will be contacted with the lab results as soon as they are available. The fastest way to get your results is to activate your My  Chart account. Instructions are located on the last page of this paperwork. If you have not heard from [place] regarding the results in 2 weeks, please contact this office.     Health Maintenance After Age 30 After age 54, you are at a higher risk for certain long-term diseases and infections as well as injuries from falls. Falls are a major cause of broken bones and head injuries in people who are older than age 35. Getting regular preventive care can help to keep you healthy and well. Preventive care includes getting regular testing and making lifestyle changes as recommended by your health care provider. Talk with your health care provider about:  Which screenings and tests you should have. A screening is a test that checks for a disease when you have no symptoms.  A diet and exercise plan that is right for you. What should I know about screenings and tests to prevent falls? Screening and testing are the best ways to find a health problem early. Early diagnosis and treatment give you the best chance of managing medical conditions that are common after age 20. Certain conditions and lifestyle choices may make you more likely to have a fall. Your health care provider may recommend:  Regular vision checks. Poor vision and conditions such as cataracts can make you more likely to have a fall. If you wear glasses, make sure to get your prescription updated if your vision changes.  Medicine review. Work with your health care provider to regularly review all of the medicines you are taking, including over-the-counter medicines. Ask your health care provider about any side effects that may make you more likely to have a fall. Tell your health care provider if any medicines that you take make you feel dizzy or sleepy.  Osteoporosis screening. Osteoporosis is  a condition that causes the bones to get weaker. This can make the bones weak and cause them to break more easily.  Blood pressure screening. Blood  pressure changes and medicines to control blood pressure can make you feel dizzy.  Strength and balance checks. Your health care provider may recommend certain tests to check your strength and balance while standing, walking, or changing positions.  Foot health exam. Foot pain and numbness, as well as not wearing proper footwear, can make you more likely to have a fall.  Depression screening. You may be more likely to have a fall if you have a fear of falling, feel emotionally low, or feel unable to do activities that you used to do.  Alcohol use screening. Using too much alcohol can affect your balance and may make you more likely to have a fall. What actions can I take to lower my risk of falls? General instructions  Talk with your health care provider about your risks for falling. Tell your health care provider if: ? You fall. Be sure to tell your health care provider about all falls, even ones that seem minor. ? You feel dizzy, sleepy, or off-balance.  Take over-the-counter and prescription medicines only as told by your health care provider. These include any supplements.  Eat a healthy diet and maintain a healthy weight. A healthy diet includes low-fat dairy products, low-fat (lean) meats, and fiber from whole grains, beans, and lots of fruits and vegetables. Home safety  Remove any tripping hazards, such as rugs, cords, and clutter.  Install safety equipment such as grab bars in bathrooms and safety rails on stairs.  Keep rooms and walkways well-lit. Activity   Follow a regular exercise program to stay fit. This will help you maintain your balance. Ask your health care provider what types of exercise are appropriate for you.  If you need a cane or walker, use it as recommended by your health care provider.  Wear supportive shoes that have nonskid soles. Lifestyle  Do not drink alcohol if your health care provider tells you not to drink.  If you drink alcohol, limit how  much you have: ? 0-1 drink a day for women. ? 0-2 drinks a day for men.  Be aware of how much alcohol is in your drink. In the [place] one drink equals one typical bottle of beer (12 oz), one-half glass of wine (5 oz), or one shot of hard liquor (1 oz).  Do not use any products that contain nicotine or tobacco, such as cigarettes and e-cigarettes. If you need help quitting, ask your health care provider. Summary  Having a healthy lifestyle and getting preventive care can help to protect your health and wellness after age 32.  Screening and testing are the best way to find a health problem early and help you avoid having a fall. Early diagnosis and treatment give you the best chance for managing medical conditions that are more common for people who are older than age 24.  Falls are a major cause of broken bones and head injuries in people who are older than age 81. Take precautions to prevent a fall at home.  Work with your health care provider to learn what changes you can make to improve your health and wellness and to prevent falls. This information is not intended to replace advice given to you by your health care provider. Make sure you discuss any questions you have with your health care provider. Document Revised: 01/12/2019 Document Reviewed:  08/04/2017 Elsevier Patient Education  2020 Elsevier Inc.      Agustina Caroli, MD [facility]

## 2020-07-31 NOTE — Patient Instructions (Addendum)
   If you have lab work done today you will be contacted with your lab results within the next 2 weeks.  If you have not heard from us then please contact us. The fastest way to get your results is to register for My Chart.   IF you received an x-ray today, you will receive an invoice from [facility]. Please contact [facility] at [phone] with questions or concerns regarding your invoice.   IF you received labwork today, you will receive an invoice from [facility]. Please contact [facility] at [phone] with questions or concerns regarding your invoice.   Our billing staff will not be able to assist you with questions regarding bills from these companies.  You will be contacted with the lab results as soon as they are available. The fastest way to get your results is to activate your My Chart account. Instructions are located on the last page of this paperwork. If you have not heard from us regarding the results in 2 weeks, please contact this office.     Health Maintenance After Age 65 After age 65, you are at a higher risk for certain long-term diseases and infections as well as injuries from falls. Falls are a major cause of broken bones and head injuries in people who are older than age 65. Getting regular preventive care can help to keep you healthy and well. Preventive care includes getting regular testing and making lifestyle changes as recommended by your health care provider. Talk with your health care provider about:  Which screenings and tests you should have. A screening is a test that checks for a disease when you have no symptoms.  A diet and exercise plan that is right for you. What should I know about screenings and tests to prevent falls? Screening and testing are the best ways to find a health problem early. Early diagnosis and treatment give you the best chance of managing medical conditions that are common after age 65. Certain conditions and  lifestyle choices may make you more likely to have a fall. Your health care provider may recommend:  Regular vision checks. Poor vision and conditions such as cataracts can make you more likely to have a fall. If you wear glasses, make sure to get your prescription updated if your vision changes.  Medicine review. Work with your health care provider to regularly review all of the medicines you are taking, including over-the-counter medicines. Ask your health care provider about any side effects that may make you more likely to have a fall. Tell your health care provider if any medicines that you take make you feel dizzy or sleepy.  Osteoporosis screening. Osteoporosis is a condition that causes the bones to get weaker. This can make the bones weak and cause them to break more easily.  Blood pressure screening. Blood pressure changes and medicines to control blood pressure can make you feel dizzy.  Strength and balance checks. Your health care provider may recommend certain tests to check your strength and balance while standing, walking, or changing positions.  Foot health exam. Foot pain and numbness, as well as not wearing proper footwear, can make you more likely to have a fall.  Depression screening. You may be more likely to have a fall if you have a fear of falling, feel emotionally low, or feel unable to do activities that you used to do.  Alcohol use screening. Using too much alcohol can affect your balance and may make you more likely to   have a fall. What actions can I take to lower my risk of falls? General instructions  Talk with your health care provider about your risks for falling. Tell your health care provider if: ? You fall. Be sure to tell your health care provider about all falls, even ones that seem minor. ? You feel dizzy, sleepy, or off-balance.  Take over-the-counter and prescription medicines only as told by your health care provider. These include any  supplements.  Eat a healthy diet and maintain a healthy weight. A healthy diet includes low-fat dairy products, low-fat (lean) meats, and fiber from whole grains, beans, and lots of fruits and vegetables. Home safety  Remove any tripping hazards, such as rugs, cords, and clutter.  Install safety equipment such as grab bars in bathrooms and safety rails on stairs.  Keep rooms and walkways well-lit. Activity   Follow a regular exercise program to stay fit. This will help you maintain your balance. Ask your health care provider what types of exercise are appropriate for you.  If you need a cane or walker, use it as recommended by your health care provider.  Wear supportive shoes that have nonskid soles. Lifestyle  Do not drink alcohol if your health care provider tells you not to drink.  If you drink alcohol, limit how much you have: ? 0-1 drink a day for women. ? 0-2 drinks a day for men.  Be aware of how much alcohol is in your drink. In the [place] one drink equals one typical bottle of beer (12 oz), one-half glass of wine (5 oz), or one shot of hard liquor (1 oz).  Do not use any products that contain nicotine or tobacco, such as cigarettes and e-cigarettes. If you need help quitting, ask your health care provider. Summary  Having a healthy lifestyle and getting preventive care can help to protect your health and wellness after age 65.  Screening and testing are the best way to find a health problem early and help you avoid having a fall. Early diagnosis and treatment give you the best chance for managing medical conditions that are more common for people who are older than age 65.  Falls are a major cause of broken bones and head injuries in people who are older than age 65. Take precautions to prevent a fall at home.  Work with your health care provider to learn what changes you can make to improve your health and wellness and to prevent falls. This information is not intended  to replace advice given to you by your health care provider. Make sure you discuss any questions you have with your health care provider. Document Revised: 01/12/2019 Document Reviewed: 08/04/2017 Elsevier Patient Education  2020 Elsevier Inc.  

## 2020-08-01 LAB — COMPREHENSIVE METABOLIC PANEL
ALT: 27 IU/L (ref 0–44)
AST: 21 IU/L (ref 0–40)
Albumin/Globulin Ratio: 1.8 (ref 1.2–2.2)
Albumin: 4.9 g/dL — ABNORMAL HIGH (ref 3.7–4.7)
Alkaline Phosphatase: 55 IU/L (ref 44–121)
BUN/Creatinine Ratio: 12 (ref 10–24)
BUN: 12 mg/dL (ref 8–27)
Bilirubin Total: 0.5 mg/dL (ref 0.0–1.2)
CO2: 27 mmol/L (ref 20–29)
Calcium: 9.8 mg/dL (ref 8.6–10.2)
Chloride: 101 mmol/L (ref 96–106)
Creatinine, Ser: 1.01 mg/dL (ref 0.76–1.27)
GFR calc Af Amer: 85 mL/min/{1.73_m2} (ref >59)
GFR calc non Af Amer: 73 mL/min/{1.73_m2} (ref >59)
Globulin, Total: 2.7 g/dL (ref 1.5–4.5)
Glucose: 94 mg/dL (ref 65–99)
Potassium: 5 mmol/L (ref 3.5–5.2)
Sodium: 140 mmol/L (ref 134–144)
Total Protein: 7.6 g/dL (ref 6.0–8.5)

## 2020-08-01 LAB — LIPID PANEL
Chol/HDL Ratio: 3.2 ratio (ref 0.0–5.0)
Cholesterol, Total: 183 mg/dL (ref 100–199)
HDL: 57 mg/dL (ref >39)
LDL Chol Calc (NIH): 110 mg/dL — ABNORMAL HIGH (ref 0–99)
Triglycerides: 89 mg/dL (ref 0–149)
VLDL Cholesterol Cal: 16 mg/dL (ref 5–40)

## 2020-08-02 DIAGNOSIS — Z23 Encounter for immunization: Secondary | ICD-10-CM | POA: Diagnosis not present

## 2020-09-08 ENCOUNTER — Other Ambulatory Visit: Payer: Self-pay | Admitting: Emergency Medicine

## 2020-09-08 DIAGNOSIS — Z8619 Personal history of other infectious and parasitic diseases: Secondary | ICD-10-CM

## 2020-09-09 ENCOUNTER — Encounter: Payer: Self-pay | Admitting: Emergency Medicine

## 2020-09-10 ENCOUNTER — Other Ambulatory Visit: Payer: Self-pay | Admitting: Emergency Medicine

## 2020-09-10 ENCOUNTER — Telehealth: Payer: Self-pay | Admitting: Emergency Medicine

## 2020-09-10 DIAGNOSIS — Z8619 Personal history of other infectious and parasitic diseases: Secondary | ICD-10-CM

## 2020-09-10 MED ORDER — ACYCLOVIR 200 MG PO CAPS: 200.0000 mg | ORAL_CAPSULE | Freq: Four times a day (QID) | ORAL | 2 refills | Status: DC

## 2020-09-10 NOTE — Telephone Encounter (Signed)
Patient having flareup and needs refill asap (Rx expired)  acyclovir (ZOVIRAX) 200 MG capsule [[id number]]   Pharmacy:  [facility] [address], [place] AT [place]  [address] [place] [address]  Phone:  [phone] Fax:  [phone]  DEA #:  [id number]  Please advise at [phone]

## 2020-09-10 NOTE — Telephone Encounter (Signed)
Prescription sent. Thanks

## 2020-09-10 NOTE — Telephone Encounter (Signed)
Please refill prescription if appropriate, last prescribed in 2019.

## 2020-09-11 ENCOUNTER — Other Ambulatory Visit: Payer: Self-pay

## 2020-09-11 DIAGNOSIS — Z8619 Personal history of other infectious and parasitic diseases: Secondary | ICD-10-CM

## 2020-10-10 ENCOUNTER — Ambulatory Visit: Payer: Medicare Other

## 2020-10-17 ENCOUNTER — Other Ambulatory Visit: Payer: Self-pay

## 2020-10-17 ENCOUNTER — Other Ambulatory Visit: Payer: Self-pay | Admitting: Emergency Medicine

## 2020-10-17 ENCOUNTER — Ambulatory Visit (INDEPENDENT_AMBULATORY_CARE_PROVIDER_SITE_OTHER): Payer: Medicare Other | Admitting: Family Medicine

## 2020-10-17 DIAGNOSIS — L821 Other seborrheic keratosis: Secondary | ICD-10-CM | POA: Diagnosis not present

## 2020-10-17 DIAGNOSIS — E785 Hyperlipidemia, unspecified: Secondary | ICD-10-CM

## 2020-10-17 DIAGNOSIS — I700 Atherosclerosis of aorta: Secondary | ICD-10-CM

## 2020-10-17 DIAGNOSIS — R21 Rash and other nonspecific skin eruption: Secondary | ICD-10-CM | POA: Insufficient documentation

## 2020-10-17 DIAGNOSIS — L989 Disorder of the skin and subcutaneous tissue, unspecified: Secondary | ICD-10-CM | POA: Insufficient documentation

## 2020-10-17 NOTE — Assessment & Plan Note (Signed)
SK of inferior anterior neck (1), left anterior shoulder (1), and right anterior shoulder (2).  Currently out of liquid nitrogen in clinic, cannot cryotherapy today.  Plan for repeat clinic visit with cryotherapy at that time.

## 2020-10-17 NOTE — Progress Notes (Addendum)
    SUBJECTIVE:   CHIEF COMPLAINT / HPI:   Dark spots on skin Kevin Shepard is a pleasant 75 year old male who presents to dermatology clinic today for evaluation of several dark spots on his skin.  He wants [place] to follow-up on the lesion of his right eyelid along with evaluating for the first time lesions on his right neck, right anterior shoulder, and left anterior shoulder.  None of these lesions are bothersome.  He denies pruritus, erythema, warmth, or swelling.  PERTINENT  PMH / PSH: COPD Gold stage II, chronic bilateral low back pain, seborrheic keratoses, actinic keratoses  OBJECTIVE:   BP 118/60   Pulse (!) 53   Ht 5\' 9"  (1.753 m)   Wt 171 lb 6.4 oz (77.7 kg)   SpO2 98%   BMI 25.31 kg/m    Physical Exam General: Awake, alert, oriented, no distress Respiratory: Unlabored breathing, no respiratory distress Extremities: No bilateral lower extremity edema, palpable pedal and pretibial pulses bilaterally Neuro: Cranial nerves II through X grossly intact, able to move all extremities spontaneously Skin (General): Scattered freckles, moles, pinpoint cherry hemangiomas.  No rashes or erythema. Skin (right inferior anterior neck): Circular macular lesion with dark brown base, flaky white skin overlying, not appreciably raised Skin (right superior eyelid): Macular lesion with light brown base, regular borders, circular shape, unchanged from photo in September Skin (shoulders): Small discrete circular macular dark brown lesions with overlying flaky white skin, 2 on right 1 on the left. Left shoulder lesion with varied coloration, dark brown center with lighter edges and irregular white flaking skin overlying.  Right shoulder:   Right neck:   Left shoulder:         ASSESSMENT/PLAN:   Seborrheic keratoses SK of inferior anterior neck (1), left anterior shoulder (1), and right anterior shoulder (2).  Currently out of liquid nitrogen in clinic, cannot cryotherapy today.  Plan for  repeat clinic visit with cryotherapy at that time.  Skin lesion, superficial Given that clinic is out of liquid nitrogen at this time, offered patient watchful waiting versus biopsy today.  Patient elected to watch this eyelid lesion and have it frozen at his next follow-up Derm appointment.    Ezequiel Essex, MD [facility]

## 2020-10-17 NOTE — Progress Notes (Deleted)
    SUBJECTIVE:   CHIEF COMPLAINT / HPI:   ***  PERTINENT  PMH / PSH: ***  OBJECTIVE:   BP 118/60   Pulse (!) 53   Ht 5\' 9"  (1.753 m)   Wt 171 lb 6.4 oz (77.7 kg)   SpO2 98%   BMI 25.31 kg/m   ***  ASSESSMENT/PLAN:   No problem-specific Assessment & Plan notes found for this encounter.     Ezequiel Essex, MD [place]

## 2020-10-17 NOTE — Assessment & Plan Note (Signed)
Given that clinic is out of liquid nitrogen at this time, offered patient watchful waiting versus biopsy today.  Patient elected to watch this eyelid lesion and have it frozen at his next follow-up Derm appointment.

## 2020-10-17 NOTE — Patient Instructions (Signed)
It was wonderful to meet you today. Thank you for allowing me to be a part of your care. Below is a short summary of what we discussed at your visit today:  Dark spots on skin For the dark spots on your shoulders and neck, we would like to "freeze" them, or apply cryotherapy using liquid nitrogen.  Unfortunately, we are out of liquid nitrogen and it is on backorder.  Please call the clinic back in a week or two to check if we have any liquid nitrogen in stock.  If we do, we can make an appointment for Derm clinic for you that we may freeze these dark spots at your next visit.  In terms of the dark spot on your right eyelid, after some discussion you have elected to also freeze it at your next appointment.  We will take pictures at your next appointment we may have comparison photos. If this spot fails to change after the third cryotherapy, we may consider biopsy in the near future.   Remember, if any of these dark spots start to change in size, have irregular borders, or have inconsistent coloration please let us know.  This may be an indication we need to get you in sooner for a biopsy. As of right now, none of the lesions are concerning enough to do a biopsy today.   If you have any questions or concerns, please do not hesitate to contact us via phone or MyChart message.   Ezequiel Essex, MD

## 2020-12-05 ENCOUNTER — Ambulatory Visit (INDEPENDENT_AMBULATORY_CARE_PROVIDER_SITE_OTHER): Payer: Medicare Other | Admitting: Family Medicine

## 2020-12-05 ENCOUNTER — Other Ambulatory Visit: Payer: Self-pay

## 2020-12-05 DIAGNOSIS — L821 Other seborrheic keratosis: Secondary | ICD-10-CM | POA: Diagnosis not present

## 2020-12-05 NOTE — Assessment & Plan Note (Signed)
-  cryotherapy performed for the appropriate areas -wound care and return precautions discussed

## 2020-12-05 NOTE — Patient Instructions (Signed)
It was great seeing you today!  It looks like you have seborrheic keratosis, for that we used cryotherapy (liquid nitrogen) to remove these.  Use vaseline on the lesion on your back and please avoid picking at it, if it does not heal, please come back.   Please follow up as needed, if anything arises between now and then, please don't hesitate to contact our office.   Thank you for allowing [place] to be a part of your medical care!  Thank you, Dr. Larae Grooms

## 2020-12-05 NOTE — Progress Notes (Addendum)
    SUBJECTIVE:   CHIEF COMPLAINT / HPI:   Brown nevi Patient presents with "multiple brown moles" that he reports started a few months ago. Denies recent sun exposure. Denies pain, pruritus, drainage and bleeding. Denies any other associated symptoms. States that the appearance of these just bothers him and he wants it removed. Denies any personal history or family history of skin cancer. Patient had previous cryotherapy performed on the right ear and eyelid, the area on the eyelid persists.     OBJECTIVE:   BP 130/80   Pulse (!) 59   Ht 5\' 9"  (1.753 m)   Wt 171 lb 9.6 oz (77.8 kg)   SpO2 97%   BMI 25.34 kg/m   General: Patient well-appearing, in no acute distress. Resp: normal respiratory effort  Derm: skin warm and dry to touch, 1-3 cm circular, raised areas along the right eyelid, right shoulder, back and across the chest bilaterally, all consistent with seborrheic keratosis , no rashes or other lesions noted             ASSESSMENT/PLAN:   Seborrheic keratoses -cryotherapy performed for the appropriate areas -wound care and return precautions discussed      Donney Dice, [place]

## 2020-12-29 ENCOUNTER — Other Ambulatory Visit: Payer: Self-pay | Admitting: Emergency Medicine

## 2020-12-29 DIAGNOSIS — Z8619 Personal history of other infectious and parasitic diseases: Secondary | ICD-10-CM

## 2020-12-30 ENCOUNTER — Telehealth: Payer: Self-pay | Admitting: *Deleted

## 2020-12-30 ENCOUNTER — Other Ambulatory Visit: Payer: Self-pay | Admitting: *Deleted

## 2020-12-30 DIAGNOSIS — Z8619 Personal history of other infectious and parasitic diseases: Secondary | ICD-10-CM

## 2020-12-30 MED ORDER — ACYCLOVIR 200 MG PO CAPS: 200.0000 mg | ORAL_CAPSULE | Freq: Four times a day (QID) | ORAL | 0 refills | Status: DC

## 2020-12-30 NOTE — Telephone Encounter (Signed)
Routing to CMA 

## 2020-12-30 NOTE — Telephone Encounter (Signed)
Left message in mobile voice mail to call back concerning medication refill.

## 2020-12-31 ENCOUNTER — Telehealth: Payer: Self-pay | Admitting: *Deleted

## 2020-12-31 NOTE — Telephone Encounter (Signed)
Spoke to patient about the refill on Acyclovir, the refill is at his pharmacy. Per patient he has gotten a couple of calls from the pharmacy concerning additional refills. He will let the pharmacy know to keep them on file.

## 2020-12-31 NOTE — Telephone Encounter (Signed)
Patient returned call. He can be reached at [phone].

## 2021-01-29 ENCOUNTER — Other Ambulatory Visit: Payer: Self-pay | Admitting: Emergency Medicine

## 2021-01-29 ENCOUNTER — Other Ambulatory Visit: Payer: Self-pay

## 2021-01-29 ENCOUNTER — Ambulatory Visit (INDEPENDENT_AMBULATORY_CARE_PROVIDER_SITE_OTHER): Payer: Medicare Other | Admitting: Emergency Medicine

## 2021-01-29 ENCOUNTER — Encounter: Payer: Self-pay | Admitting: Emergency Medicine

## 2021-01-29 VITALS — BP 118/70 | HR 57 | Temp 98.5°F | Ht 69.0 in | Wt 164.6 lb

## 2021-01-29 DIAGNOSIS — G4700 Insomnia, unspecified: Secondary | ICD-10-CM | POA: Diagnosis not present

## 2021-01-29 DIAGNOSIS — Z1329 Encounter for screening for other suspected endocrine disorder: Secondary | ICD-10-CM

## 2021-01-29 DIAGNOSIS — I2510 Atherosclerotic heart disease of native coronary artery without angina pectoris: Secondary | ICD-10-CM | POA: Diagnosis not present

## 2021-01-29 DIAGNOSIS — J449 Chronic obstructive pulmonary disease, unspecified: Secondary | ICD-10-CM | POA: Diagnosis not present

## 2021-01-29 DIAGNOSIS — Z87891 Personal history of nicotine dependence: Secondary | ICD-10-CM

## 2021-01-29 DIAGNOSIS — E785 Hyperlipidemia, unspecified: Secondary | ICD-10-CM | POA: Diagnosis not present

## 2021-01-29 DIAGNOSIS — I700 Atherosclerosis of aorta: Secondary | ICD-10-CM | POA: Diagnosis not present

## 2021-01-29 DIAGNOSIS — Z1321 Encounter for screening for nutritional disorder: Secondary | ICD-10-CM | POA: Diagnosis not present

## 2021-01-29 DIAGNOSIS — Z13228 Encounter for screening for other metabolic disorders: Secondary | ICD-10-CM

## 2021-01-29 DIAGNOSIS — Z130 Encounter for screening for diseases of the blood and blood-forming organs and certain disorders involving the immune mechanism: Secondary | ICD-10-CM | POA: Diagnosis not present

## 2021-01-29 LAB — LIPID PANEL
Cholesterol: 144 mg/dL (ref 0–200)
HDL: 61.3 mg/dL (ref >39.00)
LDL Cholesterol: 70 mg/dL (ref 0–99)
NonHDL: 82.96
Total CHOL/HDL Ratio: 2
Triglycerides: 67 mg/dL (ref 0.0–149.0)
VLDL: 13.4 mg/dL (ref 0.0–40.0)

## 2021-01-29 LAB — COMPREHENSIVE METABOLIC PANEL
ALT: 24 U/L (ref 0–53)
AST: 23 U/L (ref 0–37)
Albumin: 4.5 g/dL (ref 3.5–5.2)
Alkaline Phosphatase: 42 U/L (ref 39–117)
BUN: 16 mg/dL (ref 6–23)
CO2: 29 mEq/L (ref 19–32)
Calcium: 9.8 mg/dL (ref 8.4–10.5)
Chloride: 104 mEq/L (ref 96–112)
Creatinine, Ser: 0.98 mg/dL (ref 0.40–1.50)
GFR: 76.03 mL/min (ref >60.00)
Glucose, Bld: 89 mg/dL (ref 70–99)
Potassium: 4.9 mEq/L (ref 3.5–5.1)
Sodium: 142 mEq/L (ref 135–145)
Total Bilirubin: 0.6 mg/dL (ref 0.2–1.2)
Total Protein: 7.7 g/dL (ref 6.0–8.3)

## 2021-01-29 LAB — CBC WITH DIFFERENTIAL/PLATELET
Basophils Absolute: 0 10*3/uL (ref 0.0–0.1)
Basophils Relative: 0.5 % (ref 0.0–3.0)
Eosinophils Absolute: 0.1 10*3/uL (ref 0.0–0.7)
Eosinophils Relative: 3.5 % (ref 0.0–5.0)
HCT: 40.4 % (ref 39.0–52.0)
Hemoglobin: 13.8 g/dL (ref 13.0–17.0)
Lymphocytes Relative: 36.6 % (ref 12.0–46.0)
Lymphs Abs: 1.5 10*3/uL (ref 0.7–4.0)
MCHC: 34.1 g/dL (ref 30.0–36.0)
MCV: 90.6 fl (ref 78.0–100.0)
Monocytes Absolute: 0.4 10*3/uL (ref 0.1–1.0)
Monocytes Relative: 10.4 % (ref 3.0–12.0)
Neutro Abs: 2 10*3/uL (ref 1.4–7.7)
Neutrophils Relative %: 49 % (ref 43.0–77.0)
Platelets: 263 10*3/uL (ref 150.0–400.0)
RBC: 4.46 Mil/uL (ref 4.22–5.81)
RDW: 13.7 % (ref 11.5–15.5)
WBC: 4.2 10*3/uL (ref 4.0–10.5)

## 2021-01-29 MED ORDER — ESZOPICLONE 3 MG PO TABS: 3.0000 mg | ORAL_TABLET | Freq: Every day | ORAL | 1 refills | Status: DC

## 2021-01-29 MED ORDER — ROSUVASTATIN CALCIUM 10 MG PO TABS: 10.0000 mg | ORAL_TABLET | Freq: Every day | ORAL | 3 refills | Status: DC

## 2021-01-29 NOTE — Assessment & Plan Note (Signed)
Continue rosuvastatin 10 mg daily. Diet and nutrition discussed.

## 2021-01-29 NOTE — Progress Notes (Signed)
Kevin Shepard 75 y.o.   Chief Complaint  Patient presents with  . chronic medical con    Follow up 6 months  . Medication Refill    Crestor and Eszopiclone    HISTORY OF PRESENT ILLNESS: This is a 75 y.o. male with several chronic medical problems here for follow-up. 1.  COPD mostly emphysema, former smoker, stable. Not taking inhaler due to throat irritation which was affecting his voice which is of paramount importance at his job. Denies significant dyspnea on exertion. 2.  Dyslipidemia and aortic atherosclerosis, on rosuvastatin 10 mg daily.  Needs refill. 3.  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  Intermittent use and working well. 4.  Coronary atherosclerosis found on CT of chest February 2021.  Asymptomatic.  No anginal symptoms.  Needs cardiology referral. Needs blood work today.  Requesting testosterone levels as well. Occasionally gets itching, burning and swelling of hands with unknown trigger and resolving spontaneously. No other complaints or medical concerns today.  HPI   Prior to Admission medications   Medication Sig Start Date End Date Taking? Authorizing Provider  eszopiclone (LUNESTA) 2 MG TABS tablet Take 1 tablet (2 mg total) by mouth at bedtime as needed. for sleep 07/31/20  Yes Chaquita Basques, Ines Bloomer, MD  Eszopiclone 3 MG TABS Take 1 tablet (3 mg total) by mouth at bedtime. Take immediately before bedtime as needed. 07/31/20  Yes Aryssa Rosamond, Ines Bloomer, MD  rosuvastatin (CRESTOR) 10 MG tablet Take 1 tablet (10 mg total) by mouth daily. 01/23/20  Yes Aashi Derrington, Ines Bloomer, MD  acyclovir (ZOVIRAX) 200 MG capsule Take 1 capsule (200 mg total) by mouth 4 (four) times daily for 7 days. At time of flare-up. 12/30/20 01/06/21  Horald Pollen, MD  fluticasone furoate-vilanterol (BREO ELLIPTA) 200-25 MCG/INH AEPB Inhale 1 puff into the lungs daily. Patient not taking: Reported on 01/29/2021 02/08/20   Horald Pollen, MD    Not on File  Patient Active  Problem List   Diagnosis Date Noted  . Dyslipidemia 01/29/2021  . Atherosclerosis of aorta ([place]) 01/29/2021  . 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 COPD  GOLD II/ Group A 06/02/2018  . Left upper lobe pulmonary nodule 06/02/2018  . Former smoker 04/06/2018  . Alcohol use 01/10/2018  . 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 Chronic bilateral low back pain without sciatica 01/10/2018  . History of tobacco use 01/10/2018  . History of back surgery 01/10/2018    Past Medical History:  Diagnosis Date  . COPD (chronic obstructive pulmonary disease) ([place])   . Hyperlipidemia     Past Surgical History:  Procedure Laterality Date  . APPENDECTOMY     per patient 1950's  . CESAREAN SECTION  03/2017  . FRACTURE SURGERY  1974   right clavicle  . LUMBAR SPINE SURGERY  2018  . SPINE SURGERY  10/2016  . TONSILLECTOMY      Social History   Socioeconomic History  . Marital status: Married    Spouse name: Not on file  . Number of children: Not on file  . Years of education: Not on file  . Highest education level: Not on file  Occupational History  . Occupation: Merchandiser, retail trading  Tobacco Use  . Smoking status: Former Smoker    Packs/day: 1.00    Years: 40.00    Pack years: 40.00    Quit date: 10/04/2017    Years since quitting: 3.3  . Smokeless tobacco: Never Used  Substance and  Sexual Activity  . Alcohol use: Yes  . Drug use: Never  . Sexual activity: Yes    Partners: Female  Other Topics Concern  . Not on file  Social History Narrative  . Not on file   Social Determinants of Health   Financial Resource Strain: Not on file  Food Insecurity: Not on file  Transportation Needs: Not on file  Physical Activity: Not on file  Stress: Not on file  Social Connections: Not on file  Intimate Partner Violence: Not on file    Family History  Problem Relation Age of Onset  . Cancer Mother   . Early death Father   . Heart disease  Father 76       rheumatic heart disease  . Hyperlipidemia Sister      Review of Systems  Constitutional: Negative.  Negative for chills and fever.  HENT: Negative.  Negative for congestion and sore throat.   Respiratory: Negative.  Negative for cough and shortness of breath.   Cardiovascular: Positive for palpitations. Negative for chest pain and leg swelling.  Gastrointestinal: Negative.  Negative for abdominal pain, blood in stool, diarrhea, melena, nausea and vomiting.  Genitourinary: Negative.  Negative for dysuria and hematuria.  Musculoskeletal: Negative.  Negative for back pain, myalgias and neck pain.  Skin: Negative.  Negative for rash.  Neurological: Negative for dizziness and headaches.  All other systems reviewed and are negative.   Today's Vitals   01/29/21 0840  BP: 118/70  Pulse: (!) 57  Temp: 98.5 F (36.9 C)  TempSrc: Oral  SpO2: 97%  Weight: 164 lb 9.6 oz (74.7 kg)  Height: 5\' 9"  (1.753 m)   Body mass index is 24.31 kg/m.  Physical Exam Vitals reviewed.  Constitutional:      Appearance: Normal appearance.  HENT:     Head: Normocephalic.  Eyes:     Extraocular Movements: Extraocular movements intact.     Conjunctiva/sclera: Conjunctivae normal.     Pupils: Pupils are equal, round, and reactive to light.  Cardiovascular:     Rate and Rhythm: Normal rate and regular rhythm.     Pulses: Normal pulses.     Heart sounds: Normal heart sounds.  Pulmonary:     Effort: Pulmonary effort is normal.     Breath sounds: Normal breath sounds.  Musculoskeletal:        General: Normal range of motion.     Cervical back: Normal range of motion and neck supple. No tenderness.  Lymphadenopathy:     Cervical: No cervical adenopathy.  Skin:    General: Skin is warm and dry.     Capillary Refill: Capillary refill takes less than 2 seconds.  Neurological:     General: No focal deficit present.     Mental Status: He is alert and oriented to person, place, and time.   Psychiatric:        Mood and Affect: Mood normal.        Behavior: Behavior normal.      ASSESSMENT & PLAN: A total of 30 minutes was spent with the patient and counseling/coordination of care regarding COPD and treatment, need to get repeat CT of the chest this year, need for cardiology evaluation of coronary atherosclerosis, cardiovascular risks associated with COPD and coronary atherosclerosis and dyslipidemia, review of all medications, education on nutrition, review of most recent office visit notes, review of most recent blood work results, health maintenance items, prognosis, documentation, need for follow-up.  COPD  GOLD II/ Group A  Former smoker.  Emphysema on chest CT.  No signs of cancer.  Doing well.  Clinically stable.  No concerns.  We will schedule chest CT for this year.  Atherosclerosis of aorta ([facility]) Continue rosuvastatin 10 mg daily. Diet and nutrition discussed.  Dyslipidemia ContinueFasting lipid profile done today.  Diet and nutrition discussed. Continue rosuvastatin 10 mg daily.  Insomnia Responded well to Lunesta.  Uses it sparingly and judiciously. Continue Lunesta 3 mg at bedtime as needed.  Atherosclerosis of native coronary artery of native heart without angina pectoris 2 vessel coronary atherosclerosis discovered on CT chest February 2021. Clinically stable.  No symptoms of angina. Will refer to cardiology for further evaluation. Continue rosuvastatin 10 mg daily.  Jamair was seen today for chronic medical con and medication refill.  Diagnoses and all orders for this visit:  COPD  GOLD II/ Group A -     CT CHEST LUNG CA SCREEN LOW DOSE W/O CM; Future -     CBC with Differential/Platelet  Former smoker -     CT CHEST LUNG CA SCREEN LOW DOSE W/O CM; Future  Dyslipidemia -     rosuvastatin (CRESTOR) 10 MG tablet; Take 1 tablet (10 mg total) by mouth daily. -     Comprehensive metabolic panel -     Lipid panel  Atherosclerosis of aorta  (HCC) -     rosuvastatin (CRESTOR) 10 MG tablet; Take 1 tablet (10 mg total) by mouth daily.  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 Take immediately before bedtime as needed.  Screening for endocrine, nutritional, metabolic and immunity disorder -     TestT+TestF+SHBG    Patient Instructions   Health Maintenance After Age 56 After age 11, you are at a higher risk for certain long-term diseases and infections as well as injuries from falls. Falls are a major cause of broken bones and head injuries in people who are older than age 28. Getting regular preventive care can help to keep you healthy and well. Preventive care includes getting regular testing and making lifestyle changes as recommended by your health care provider. Talk with your health care provider about:  Which screenings and tests you should have. A screening is a test that checks for a disease when you have no symptoms.  A diet and exercise plan that is right for you. What should I know about screenings and tests to prevent falls? Screening and testing are the best ways to find a health problem early. Early diagnosis and treatment give you the best chance of managing medical conditions that are common after age 68. Certain conditions and lifestyle choices may make you more likely to have a fall. Your health care provider may recommend:  Regular vision checks. Poor vision and conditions such as cataracts can make you more likely to have a fall. If you wear glasses, make sure to get your prescription updated if your vision changes.  Medicine review. Work with your health care provider to regularly review all of the medicines you are taking, including over-the-counter medicines. Ask your health care provider about any side effects that may make  you more likely to have a fall. Tell your health care provider if any medicines that you take make you feel dizzy or sleepy.  Osteoporosis screening. Osteoporosis is  a condition that causes the bones to get weaker. This can make the bones weak and cause them to break more easily.  Blood pressure screening. Blood pressure changes and medicines to control blood pressure can make you feel dizzy.  Strength and balance checks. Your health care provider may recommend certain tests to check your strength and balance while standing, walking, or changing positions.  Foot health exam. Foot pain and numbness, as well as not wearing proper footwear, can make you more likely to have a fall.  Depression screening. You may be more likely to have a fall if you have a fear of falling, feel emotionally low, or feel unable to do activities that you used to do.  Alcohol use screening. Using too much alcohol can affect your balance and may make you more likely to have a fall. What actions can I take to lower my risk of falls? General instructions  Talk with your health care provider about your risks for falling. Tell your health care provider if: ? You fall. Be sure to tell your health care provider about all falls, even ones that seem minor. ? You feel dizzy, sleepy, or off-balance.  Take over-the-counter and prescription medicines only as told by your health care provider. These include any supplements.  Eat a healthy diet and maintain a healthy weight. A healthy diet includes low-fat dairy products, low-fat (lean) meats, and fiber from whole grains, beans, and lots of fruits and vegetables. Home safety  Remove any tripping hazards, such as rugs, cords, and clutter.  Install safety equipment such as grab bars in bathrooms and safety rails on stairs.  Keep rooms and walkways well-lit. Activity  Follow a regular exercise program to stay fit. This will help you maintain your balance. Ask your health care  provider what types of exercise are appropriate for you.  If you need a cane or walker, use it as recommended by your health care provider.  Wear supportive shoes that have nonskid soles.   Lifestyle  Do not drink alcohol if your health care provider tells you not to drink.  If you drink alcohol, limit how much you have: ? 0-1 drink a day for women. ? 0-2 drinks a day for men.  Be aware of how much alcohol is in your drink. In the [place] one drink equals one typical bottle of beer (12 oz), one-half glass of wine (5 oz), or one shot of hard liquor (1 oz).  Do not use any products that contain nicotine or tobacco, such as cigarettes and e-cigarettes. If you need help quitting, ask your health care provider. Summary  Having a healthy lifestyle and getting preventive care can help to protect your health and wellness after age 36.  Screening and testing are the best way to find a health problem early and help you avoid having a fall. Early diagnosis and treatment give you the best chance for managing medical conditions that are more common for people who are older than age 33.  Falls are a major cause of broken bones and head injuries in people who are older than age 51. Take precautions to prevent a fall at home.  Work with your health care provider to learn what changes you can make to improve your health and wellness and to prevent falls. This information is not intended to replace advice given to you by your health care provider. Make sure you discuss any questions you have with your health care provider. Document Revised: 01/12/2019 Document Reviewed:  08/04/2017 Elsevier Patient Education  2021 [place], MD [facility]

## 2021-01-29 NOTE — Assessment & Plan Note (Addendum)
ContinueFasting lipid profile done today.  Diet and nutrition discussed. Continue rosuvastatin 10 mg daily.

## 2021-01-29 NOTE — Assessment & Plan Note (Signed)
Former smoker.  Emphysema on chest CT.  No signs of cancer.  Doing well.  Clinically stable.  No concerns.  We will schedule chest CT for this year.

## 2021-01-29 NOTE — Assessment & Plan Note (Signed)
Responded well to Lunesta.  Uses it sparingly and judiciously. Continue Lunesta 3 mg at bedtime as needed.

## 2021-01-29 NOTE — Assessment & Plan Note (Signed)
2 vessel coronary atherosclerosis discovered on CT chest February 2021. Clinically stable.  No symptoms of angina. Will refer to cardiology for further evaluation. Continue rosuvastatin 10 mg daily.

## 2021-01-29 NOTE — Patient Instructions (Signed)
Health Maintenance After Age 75 After age 75, you are at a higher risk for certain long-term diseases and infections as well as injuries from falls. Falls are a major cause of broken bones and head injuries in people who are older than age 75. Getting regular preventive care can help to keep you healthy and well. Preventive care includes getting regular testing and making lifestyle changes as recommended by your health care provider. Talk with your health care provider about:  Which screenings and tests you should have. A screening is a test that checks for a disease when you have no symptoms.  A diet and exercise plan that is right for you. What should I know about screenings and tests to prevent falls? Screening and testing are the best ways to find a health problem early. Early diagnosis and treatment give you the best chance of managing medical conditions that are common after age 75. Certain conditions and lifestyle choices may make you more likely to have a fall. Your health care provider may recommend:  Regular vision checks. Poor vision and conditions such as cataracts can make you more likely to have a fall. If you wear glasses, make sure to get your prescription updated if your vision changes.  Medicine review. Work with your health care provider to regularly review all of the medicines you are taking, including over-the-counter medicines. Ask your health care provider about any side effects that may make you more likely to have a fall. Tell your health care provider if any medicines that you take make you feel dizzy or sleepy.  Osteoporosis screening. Osteoporosis is a condition that causes the bones to get weaker. This can make the bones weak and cause them to break more easily.  Blood pressure screening. Blood pressure changes and medicines to control blood pressure can make you feel dizzy.  Strength and balance checks. Your health care provider may recommend certain tests to check your  strength and balance while standing, walking, or changing positions.  Foot health exam. Foot pain and numbness, as well as not wearing proper footwear, can make you more likely to have a fall.  Depression screening. You may be more likely to have a fall if you have a fear of falling, feel emotionally low, or feel unable to do activities that you used to do.  Alcohol use screening. Using too much alcohol can affect your balance and may make you more likely to have a fall. What actions can I take to lower my risk of falls? General instructions  Talk with your health care provider about your risks for falling. Tell your health care provider if: ? You fall. Be sure to tell your health care provider about all falls, even ones that seem minor. ? You feel dizzy, sleepy, or off-balance.  Take over-the-counter and prescription medicines only as told by your health care provider. These include any supplements.  Eat a healthy diet and maintain a healthy weight. A healthy diet includes low-fat dairy products, low-fat (lean) meats, and fiber from whole grains, beans, and lots of fruits and vegetables. Home safety  Remove any tripping hazards, such as rugs, cords, and clutter.  Install safety equipment such as grab bars in bathrooms and safety rails on stairs.  Keep rooms and walkways well-lit. Activity  Follow a regular exercise program to stay fit. This will help you maintain your balance. Ask your health care provider what types of exercise are appropriate for you.  If you need a cane or walker,   use it as recommended by your health care provider.  Wear supportive shoes that have nonskid soles.   Lifestyle  Do not drink alcohol if your health care provider tells you not to drink.  If you drink alcohol, limit how much you have: ? 0-1 drink a day for women. ? 0-2 drinks a day for men.  Be aware of how much alcohol is in your drink. In the [place] one drink equals one typical bottle of beer (12  oz), one-half glass of wine (5 oz), or one shot of hard liquor (1 oz).  Do not use any products that contain nicotine or tobacco, such as cigarettes and e-cigarettes. If you need help quitting, ask your health care provider. Summary  Having a healthy lifestyle and getting preventive care can help to protect your health and wellness after age 75.  Screening and testing are the best way to find a health problem early and help you avoid having a fall. Early diagnosis and treatment give you the best chance for managing medical conditions that are more common for people who are older than age 75.  Falls are a major cause of broken bones and head injuries in people who are older than age 75. Take precautions to prevent a fall at home.  Work with your health care provider to learn what changes you can make to improve your health and wellness and to prevent falls. This information is not intended to replace advice given to you by your health care provider. Make sure you discuss any questions you have with your health care provider. Document Revised: 01/12/2019 Document Reviewed: 08/04/2017 Elsevier Patient Education  2021 Elsevier Inc[employer]  

## 2021-02-03 ENCOUNTER — Telehealth: Payer: Self-pay | Admitting: Acute Care

## 2021-02-03 ENCOUNTER — Other Ambulatory Visit: Payer: Self-pay | Admitting: *Deleted

## 2021-02-03 DIAGNOSIS — Z87891 Personal history of nicotine dependence: Secondary | ICD-10-CM

## 2021-02-03 NOTE — Telephone Encounter (Signed)
Spoke with pt and scheduled SDMV 02/14/21 2:00 CT ordered Nothing further needed

## 2021-02-05 ENCOUNTER — Encounter: Payer: Self-pay | Admitting: Emergency Medicine

## 2021-02-07 LAB — TESTT+TESTF+SHBG
Sex Hormone Binding: 32.4 nmol/L (ref 19.3–76.4)
Testosterone, Free: 3.4 pg/mL — ABNORMAL LOW (ref 6.6–18.1)
Testosterone, Total, LC/MS: 365.2 ng/dL (ref 264.0–916.0)

## 2021-02-12 ENCOUNTER — Telehealth: Payer: Self-pay | Admitting: Emergency Medicine

## 2021-02-12 NOTE — Telephone Encounter (Signed)
LVM for pt to rtn my call to schedule AWV with [facility]. Please schedule if pt calls the office.  ?

## 2021-02-14 ENCOUNTER — Ambulatory Visit
Admission: RE | Admit: 2021-02-14 | Discharge: 2021-02-14 | Disposition: A | Discharge status: Home / Self Care | Payer: Medicare Other | Source: Ambulatory Visit | Attending: Acute Care | Admitting: Acute Care

## 2021-02-14 ENCOUNTER — Ambulatory Visit (INDEPENDENT_AMBULATORY_CARE_PROVIDER_SITE_OTHER): Payer: Medicare Other | Admitting: Primary Care

## 2021-02-14 ENCOUNTER — Encounter: Payer: Self-pay | Admitting: Primary Care

## 2021-02-14 ENCOUNTER — Other Ambulatory Visit: Payer: Self-pay

## 2021-02-14 VITALS — BP 110/72 | HR 59 | Temp 98.0°F | Ht 69.0 in | Wt 165.0 lb

## 2021-02-14 DIAGNOSIS — Z87891 Personal history of nicotine dependence: Secondary | ICD-10-CM | POA: Diagnosis not present

## 2021-02-14 NOTE — Patient Instructions (Signed)
Thank you for participating in the [facility]. It was our pleasure to meet you today. We will call you with the results of your scan within the next few days. Your scan will be assigned a Lung RADS category score by the physicians reading the scans.  This Lung RADS score determines follow up scanning.  See below for description of categories, and follow up screening recommendations. We will be in touch to schedule your follow up screening annually or based on recommendations of our providers. We will fax a copy of your scan results to your Primary Care Physician, or the physician who referred you to the program, to ensure they have the results. Please call the office if you have any questions or concerns regarding your scanning experience or results.  Our office number is [phone]. Please speak with Denise Phelps, RN. She is our Lung Cancer Screening RN. If she is unavailable when you call, please have the office staff send her a message. She will return your call at her earliest convenience. Remember, if your scan is normal, we will scan you annually as long as you continue to meet the criteria for the program. (Age 55-77, Current smoker or smoker who has quit within the last 15 years). If you are a smoker, remember, quitting is the single most powerful action that you can take to decrease your risk of lung cancer and other pulmonary, breathing related problems. We know quitting is hard, and we are here to help.  Please let us know if there is anything we can do to help you meet your goal of quitting. If you are a former smoker, congratulations. We are proud of you! Remain smoke free! Remember you can refer friends or family members through the number above.  We will screen them to make sure they meet criteria for the program. Thank you for helping us take better care of you by participating in Lung Screening.  Lung RADS Categories:  Lung RADS 1: no nodules  or definitely non-concerning nodules.  Recommendation is for a repeat annual scan in 12 months.  Lung RADS 2:  nodules that are non-concerning in appearance and behavior with a very low likelihood of becoming an active cancer. Recommendation is for a repeat annual scan in 12 months.  Lung RADS 3: nodules that are probably non-concerning , includes nodules with a low likelihood of becoming an active cancer.  Recommendation is for a 6-month repeat screening scan. Often noted after an upper respiratory illness. We will be in touch to make sure you have no questions, and to schedule your 6-month scan.  Lung RADS 4 A: nodules with concerning findings, recommendation is most often for a follow up scan in 3 months or additional testing based on our provider's assessment of the scan. We will be in touch to make sure you have no questions and to schedule the recommended 3 month follow up scan.  Lung RADS 4 B:  indicates findings that are concerning. We will be in touch with you to schedule additional diagnostic testing based on our provider's  assessment of the scan.   

## 2021-02-14 NOTE — Progress Notes (Signed)
Shared Decision Making Visit Lung Cancer Screening Program [phone])   Eligibility:  Age 75 y.o.  Pack Years Smoking History Calculation 36 (# packs/per year x # years smoked)  Recent History of coughing up blood  no  Unexplained weight loss? no ( >Than 15 pounds within the last 6 months )  Prior History Lung / other cancer no (Diagnosis within the last 5 years already requiring surveillance chest CT Scans).  Smoking Status Former Smoker  Former Smokers: Years since quit: 4 years  Quit Date: 2018  Visit Components:  Discussion included one or more decision making aids. yes  Discussion included risk/benefits of screening. yes  Discussion included potential follow up diagnostic testing for abnormal scans. yes  Discussion included meaning and risk of over diagnosis. yes  Discussion included meaning and risk of False Positives. yes  Discussion included meaning of total radiation exposure. yes  Counseling Included:  Importance of adherence to annual lung cancer LDCT screening. yes  Impact of comorbidities on ability to participate in the program. yes  Ability and willingness to under diagnostic treatment. yes  Smoking Cessation Counseling:  Current Smokers:   Discussed importance of smoking cessation. yes  Information about tobacco cessation classes and interventions provided to patient. yes  Patient provided with "ticket" for LDCT Scan. yes  Symptomatic Patient. No  Counseling(Intermediate counseling: > three minutes) 99406  Diagnosis Code: Tobacco Use Z72.0  Asymptomatic Patient yes  Counseling (Intermediate counseling: > three minutes counseling) K0254  Former Smokers:   Discussed the importance of maintaining cigarette abstinence. yes  Diagnosis Code: Personal History of Nicotine Dependence. Y70.623  Information about tobacco cessation classes and interventions provided to patient. Yes  Patient provided with "ticket" for LDCT Scan. yes  Written  Order for Lung Cancer Screening with LDCT placed in Epic. Yes (CT Chest Lung Cancer Screening Low Dose W/O CM) [id number] Z12.2-Screening of respiratory organs Z87.891-Personal history of nicotine dependence  I have spent 25 minutes of face to face time with Mr Nunziato discussing the risks and benefits of lung cancer screening. We viewed a power point together that explained in detail the above noted topics. We paused at intervals to allow for questions to be asked and answered to ensure understanding.We discussed that the single most powerful action that he can take to decrease his risk of developing lung cancer is to quit smoking. We discussed whether or not he is ready to commit to setting a quit date. We discussed options for tools to aid in quitting smoking including nicotine replacement therapy, non-nicotine medications, support groups, Quit Smart classes, and behavior modification. We discussed that often times setting smaller, more achievable goals, such as eliminating 1 cigarette a day for a week and then 2 cigarettes a day for a week can be helpful in slowly decreasing the number of cigarettes smoked. This allows for a sense of accomplishment as well as providing a clinical benefit. I gave him the " Be Stronger Than Your Excuses" card with contact information for community resources, classes, free nicotine replacement therapy, and access to mobile apps, text messaging, and on-line smoking cessation help. I have also given him my card and contact information in the event he needs to contact me. We discussed the time and location of the scan, and that either Doroteo Glassman RN or I will call with the results within 24-48 hours of receiving them. I have offered him  a copy of the power point we viewed  as a resource in the event they need  reinforcement of the concepts we discussed today in the office. The patient verbalized understanding of all of  the above and had no further questions upon leaving the office.  They have my contact information in the event they have any further questions.  I spent 3 minutes counseling on smoking cessation and the health risks of continued tobacco abuse.  I explained to the patient that there has been a high incidence of coronary artery disease noted on these exams. I explained that this is a non-gated exam therefore degree or severity cannot be determined. This patient is on statin therapy. I have asked the patient to follow-up with their PCP regarding any incidental finding of coronary artery disease and management with diet or medication as their PCP  feels is clinically indicated. The patient verbalized understanding of the above and had no further questions upon completion of the visit.   Martyn Ehrich, NP

## 2021-02-18 NOTE — Telephone Encounter (Signed)
Please Advise, Dr. Mitchel Honour is out of the office this week.  Are you able to review pt testosterone labs, so I can release to pt.  Testosterone labs were pending at the time Dr. Mitchel Honour reviewed pt other lab results.  Thank you

## 2021-02-21 NOTE — Progress Notes (Signed)
Please call patient and let them  know their  low dose Ct was read as a Lung RADS 2: nodules that are benign in appearance and behavior with a very low likelihood of becoming a clinically active cancer due to size or lack of growth. Recommendation per radiology is for a repeat LDCT in 12 months. .Please let them  know we will order and schedule their  annual screening scan for 02/2022. Please let them  know there was notation of CAD on their  scan.  Please remind the patient  that this is a non-gated exam therefore degree or severity of disease  cannot be determined. Please have them  follow up with their PCP regarding potential risk factor modification, dietary therapy or pharmacologic therapy if clinically indicated. Pt.  is  currently on statin therapy. Please place order for annual  screening scan for  02/2022 and fax results to PCP. Thanks so much. 

## 2021-02-27 ENCOUNTER — Encounter: Payer: Self-pay | Admitting: *Deleted

## 2021-02-27 DIAGNOSIS — Z87891 Personal history of nicotine dependence: Secondary | ICD-10-CM

## 2021-02-28 NOTE — Telephone Encounter (Signed)
Called pt about lab results but no answer. Send pt a Therapist, music.

## 2021-03-20 ENCOUNTER — Telehealth: Payer: Self-pay | Admitting: Emergency Medicine

## 2021-03-20 NOTE — Telephone Encounter (Signed)
LVM for pt to rtn my call to schedule AWV with [facility]. Please schedule this appt if pt calls the office.  °

## 2021-03-24 ENCOUNTER — Telehealth: Payer: Self-pay | Admitting: Emergency Medicine

## 2021-03-24 NOTE — Telephone Encounter (Signed)
LVM for pt to rtn my call to schedule AWV with [facility]. Please schedule this appt if pt calls the office.  °

## 2021-03-25 NOTE — Progress Notes (Signed)
Cardiology Office Note   Date:  03/26/2021   ID:  Kevin Shepard, DOB July 13, 1946, MRN [id number]  PCP:  Horald Pollen, MD  Cardiologist:   None Referring:  Horald Pollen, MD  Chief Complaint  Patient presents with   Elevated Coronary Calcium       History of Present Illness: Kevin Shepard is a 75 y.o. male who presents for evaluation of shortness of breath and coronary calcium.  He saw Dr. [place] in 2019.  This was for chest pain.  This was thought to be non anginal.  No further testing was indicated.  He was noted recently on CT scanning for screening of lung cancer to have some aortic atherosclerosis and also coronary calcification in his LAD and circumflex distribution.  He does have some shortness of breath with activities such as walking up a flight of stairs or walking briskly.  However, this does not stop him from what he is doing.  In fact he exercises routinely doing cardio exercises and has recently started riding bike.  With this he denies any chest pressure, neck or arm discomfort.  He had no new palpitations, presyncope or syncope.  He said no weight gain or edema.  Of note he does feel a strong heartbeat sometimes at night when he is trying to go to bed.  He does have problems with sleep.   Past Medical History:  Diagnosis Date   COPD (chronic obstructive pulmonary disease) ([place])    Hyperlipidemia     Past Surgical History:  Procedure Laterality Date   APPENDECTOMY     per patient 1950's   FRACTURE SURGERY  1974   right clavicle   LUMBAR SPINE SURGERY  2018   SPINE SURGERY  10/2016   TONSILLECTOMY       Current Outpatient Medications  Medication Sig Dispense Refill   acyclovir (ZOVIRAX) 200 MG capsule Take 1 capsule (200 mg total) by mouth 4 (four) times daily for 7 days. At time of flare-up. 28 capsule 0   Eszopiclone 3 MG TABS Take 1 tablet (3 mg total) by mouth at bedtime. Take immediately before bedtime as needed. 30 tablet 1    rosuvastatin (CRESTOR) 10 MG tablet Take 1 tablet (10 mg total) by mouth daily. 90 tablet 3   No current facility-administered medications for this visit.    Allergies:   Patient has no allergy information on record.    Social History:  The patient  reports that he quit smoking about 3 years ago. His smoking use included cigarettes. He has a 40.00 pack-year smoking history. He has never used smokeless tobacco. He reports current alcohol use. He reports that he does not use drugs.   Family History:  The patient's family history includes Cancer in his mother; Early death in his father; Heart disease (age of onset: 61) in his father; Hyperlipidemia in his sister.    ROS:  Please see the history of present illness.   Otherwise, review of systems are positive for none.   All other systems are reviewed and negative.    PHYSICAL EXAM: VS:  BP 126/72   Pulse (!) 52   Ht 5\' 9"  (1.753 m)   Wt 165 lb 9.6 oz (75.1 kg)   SpO2 97%   BMI 24.45 kg/m  , BMI Body mass index is 24.45 kg/m. GENERAL:  Well appearing HEENT:  Pupils equal round and reactive, fundi not visualized, oral mucosa unremarkable NECK:  No jugular venous distention, waveform within normal  limits, carotid upstroke brisk and symmetric, no bruits, no thyromegaly LYMPHATICS:  No cervical, inguinal adenopathy LUNGS:  Clear to auscultation bilaterally BACK:  No CVA tenderness CHEST:  Unremarkable HEART:  PMI not displaced or sustained,S1 and S2 within normal limits, no S3, no S4, no clicks, no rubs, no murmurs ABD:  Flat, positive bowel sounds normal in frequency in pitch, no bruits, no rebound, no guarding, no midline pulsatile mass, no hepatomegaly, no splenomegaly EXT:  2 plus pulses throughout, no edema, no cyanosis no clubbing SKIN:  No rashes no nodules NEURO:  Cranial nerves II through XII grossly intact, motor grossly intact throughout PSYCH:  Cognitively intact, oriented to person place and time    EKG:  EKG is ordered  today. The ekg ordered today demonstrates sinus rhythm, rate 57, axis within normal limits, intervals within normal limits, no acute ST-T wave changes.   Recent Labs: 01/29/2021: ALT 24; BUN 16; Creatinine, Ser 0.98; Hemoglobin 13.8; Platelets 263.0; Potassium 4.9; Sodium 142    Lipid Panel    Component Value Date/Time   CHOL 144 01/29/2021 0908   CHOL 183 07/31/2020 1020   TRIG 67.0 01/29/2021 0908   HDL 61.30 01/29/2021 0908   HDL 57 07/31/2020 1020   CHOLHDL 2 01/29/2021 0908   VLDL 13.4 01/29/2021 0908   LDLCALC 70 01/29/2021 0908   LDLCALC 110 (H) 07/31/2020 1020      Wt Readings from Last 3 Encounters:  03/26/21 165 lb 9.6 oz (75.1 kg)  02/14/21 165 lb (74.8 kg)  01/29/21 164 lb 9.6 oz (74.7 kg)      Other studies Reviewed: Additional studies/ records that were reviewed today include:   . Review of the above records demonstrates:  Please see elsewhere in the note.     ASSESSMENT AND PLAN:  ELEVATED CORONARY CALCIUM: Because of this I will order a POET (Plain Old Exercise Treadmill).  He does have some mild shortness of breath with exertion.  Further evaluation will be based on these results.  AAA SCREENING: He is going to get an ultrasound of the abdomen for screening purposes as he is over 65 and has a smoking history.  He has not been screened.  Current medicines are reviewed at length with the patient today.  The patient does not have concerns regarding medicines.  The following changes have been made:  no change  Labs/ tests ordered today include:   Orders Placed This Encounter  Procedures   EXERCISE TOLERANCE TEST (ETT)   EKG 12-Lead   VAS [place] AAA DUPLEX     Disposition:   FU with me as needed based on results of the above   Signed, Minus Breeding, MD  03/26/2021 3:39 PM    [place]

## 2021-03-26 ENCOUNTER — Encounter: Payer: Self-pay | Admitting: Cardiology

## 2021-03-26 ENCOUNTER — Ambulatory Visit (INDEPENDENT_AMBULATORY_CARE_PROVIDER_SITE_OTHER): Payer: Medicare Other | Admitting: Cardiology

## 2021-03-26 ENCOUNTER — Other Ambulatory Visit: Payer: Self-pay

## 2021-03-26 VITALS — BP 126/72 | HR 52 | Ht 69.0 in | Wt 165.6 lb

## 2021-03-26 DIAGNOSIS — I2510 Atherosclerotic heart disease of native coronary artery without angina pectoris: Secondary | ICD-10-CM | POA: Diagnosis not present

## 2021-03-26 DIAGNOSIS — Z136 Encounter for screening for cardiovascular disorders: Secondary | ICD-10-CM | POA: Diagnosis not present

## 2021-03-26 DIAGNOSIS — R072 Precordial pain: Secondary | ICD-10-CM

## 2021-03-26 DIAGNOSIS — E785 Hyperlipidemia, unspecified: Secondary | ICD-10-CM

## 2021-03-26 NOTE — Patient Instructions (Signed)
Medication Instructions:  Your physician recommends that you continue on your current medications as directed. Please refer to the Current Medication list given to you today.  *If you need a refill on your cardiac medications before your next appointment, please call your pharmacy*   Lab Work: None ordered.    Testing/Procedures: Your physician has requested that you have an exercise tolerance test. For further information please visit [email or web address]. Please also follow instruction sheet, as given.   Your physician has requested that you have an abdominal aorta duplex. During this test, an ultrasound is used to evaluate the aorta. Allow 30 minutes for this exam. Do not eat after midnight the day before and avoid carbonated beverages    Follow-Up: At [facility], you and your health needs are our priority.  As part of our continuing mission to provide you with exceptional heart care, we have created designated Provider Care Teams.  These Care Teams include your primary Cardiologist (physician) and Advanced Practice Providers (APPs -  Physician Assistants and Nurse Practitioners) who all work together to provide you with the care you need, when you need it.  We recommend signing up for the patient portal called "MyChart".  Sign up information is provided on this After Visit Summary.  MyChart is used to connect with patients for Virtual Visits (Telemedicine).  Patients are able to view lab/test results, encounter notes, upcoming appointments, etc.  Non-urgent messages can be sent to your provider as well.   To learn more about what you can do with MyChart, go to [email or web address].    Your next appointment:   As needed  The format for your next appointment:   In Person  Provider:   Minus Breeding, MD

## 2021-03-31 ENCOUNTER — Other Ambulatory Visit: Payer: Self-pay | Admitting: Emergency Medicine

## 2021-03-31 NOTE — Telephone Encounter (Signed)
No ABN was fired off. I would not have ordered it then. If I remember correctly he requested these tests.

## 2021-03-31 NOTE — Telephone Encounter (Signed)
Testosterone levels were normal for his age.  Thanks.

## 2021-04-01 ENCOUNTER — Telehealth (HOSPITAL_COMMUNITY): Payer: Self-pay | Admitting: *Deleted

## 2021-04-01 NOTE — Telephone Encounter (Signed)
Close encounter 

## 2021-04-02 ENCOUNTER — Ambulatory Visit (HOSPITAL_COMMUNITY)
Admission: RE | Admit: 2021-04-02 | Payer: Medicare Other | Source: Ambulatory Visit | Attending: Cardiology | Admitting: Cardiology

## 2021-04-02 NOTE — Telephone Encounter (Signed)
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  Will send info to pt in mychart message.

## 2021-04-08 ENCOUNTER — Telehealth (HOSPITAL_COMMUNITY): Payer: Self-pay | Admitting: *Deleted

## 2021-04-08 NOTE — Telephone Encounter (Signed)
Close encounter 

## 2021-04-09 ENCOUNTER — Other Ambulatory Visit: Payer: Self-pay

## 2021-04-09 ENCOUNTER — Ambulatory Visit (HOSPITAL_COMMUNITY)
Admission: RE | Admit: 2021-04-09 | Discharge: 2021-04-09 | Disposition: A | Discharge status: Home / Self Care | Payer: Medicare Other | Source: Ambulatory Visit | Attending: Internal Medicine | Admitting: Internal Medicine

## 2021-04-09 DIAGNOSIS — M5134 Other intervertebral disc degeneration, thoracic region: Secondary | ICD-10-CM | POA: Diagnosis not present

## 2021-04-09 DIAGNOSIS — M9902 Segmental and somatic dysfunction of thoracic region: Secondary | ICD-10-CM | POA: Diagnosis not present

## 2021-04-09 DIAGNOSIS — M50322 Other cervical disc degeneration at C5-C6 level: Secondary | ICD-10-CM | POA: Diagnosis not present

## 2021-04-09 DIAGNOSIS — R072 Precordial pain: Secondary | ICD-10-CM | POA: Diagnosis not present

## 2021-04-09 DIAGNOSIS — M9901 Segmental and somatic dysfunction of cervical region: Secondary | ICD-10-CM | POA: Diagnosis not present

## 2021-04-09 LAB — EXERCISE TOLERANCE TEST
Estimated workload: 10.4 METS
Exercise duration (min): 9 min
Exercise duration (sec): 0 s
MPHR: 146 {beats}/min
Peak HR: 130 {beats}/min
Percent HR: 89 %
Rest HR: 61 {beats}/min

## 2021-04-10 ENCOUNTER — Ambulatory Visit (HOSPITAL_COMMUNITY): Payer: Medicare Other

## 2021-04-11 DIAGNOSIS — M50322 Other cervical disc degeneration at C5-C6 level: Secondary | ICD-10-CM | POA: Diagnosis not present

## 2021-04-11 DIAGNOSIS — M9902 Segmental and somatic dysfunction of thoracic region: Secondary | ICD-10-CM | POA: Diagnosis not present

## 2021-04-11 DIAGNOSIS — M5134 Other intervertebral disc degeneration, thoracic region: Secondary | ICD-10-CM | POA: Diagnosis not present

## 2021-04-11 DIAGNOSIS — M9901 Segmental and somatic dysfunction of cervical region: Secondary | ICD-10-CM | POA: Diagnosis not present

## 2021-04-14 ENCOUNTER — Other Ambulatory Visit: Payer: Self-pay

## 2021-04-14 ENCOUNTER — Ambulatory Visit (HOSPITAL_COMMUNITY)
Admission: RE | Admit: 2021-04-14 | Discharge: 2021-04-14 | Disposition: A | Discharge status: Home / Self Care | Payer: Medicare Other | Source: Ambulatory Visit | Attending: Cardiology | Admitting: Cardiology

## 2021-04-14 DIAGNOSIS — Z136 Encounter for screening for cardiovascular disorders: Secondary | ICD-10-CM | POA: Insufficient documentation

## 2021-04-15 DIAGNOSIS — M50322 Other cervical disc degeneration at C5-C6 level: Secondary | ICD-10-CM | POA: Diagnosis not present

## 2021-04-15 DIAGNOSIS — M9901 Segmental and somatic dysfunction of cervical region: Secondary | ICD-10-CM | POA: Diagnosis not present

## 2021-04-15 DIAGNOSIS — M9902 Segmental and somatic dysfunction of thoracic region: Secondary | ICD-10-CM | POA: Diagnosis not present

## 2021-04-15 DIAGNOSIS — M5134 Other intervertebral disc degeneration, thoracic region: Secondary | ICD-10-CM | POA: Diagnosis not present

## 2021-04-21 DIAGNOSIS — M9901 Segmental and somatic dysfunction of cervical region: Secondary | ICD-10-CM | POA: Diagnosis not present

## 2021-04-21 DIAGNOSIS — M9902 Segmental and somatic dysfunction of thoracic region: Secondary | ICD-10-CM | POA: Diagnosis not present

## 2021-04-21 DIAGNOSIS — M50322 Other cervical disc degeneration at C5-C6 level: Secondary | ICD-10-CM | POA: Diagnosis not present

## 2021-04-21 DIAGNOSIS — M5134 Other intervertebral disc degeneration, thoracic region: Secondary | ICD-10-CM | POA: Diagnosis not present

## 2021-04-24 DIAGNOSIS — M9902 Segmental and somatic dysfunction of thoracic region: Secondary | ICD-10-CM | POA: Diagnosis not present

## 2021-04-24 DIAGNOSIS — M50322 Other cervical disc degeneration at C5-C6 level: Secondary | ICD-10-CM | POA: Diagnosis not present

## 2021-04-24 DIAGNOSIS — M9901 Segmental and somatic dysfunction of cervical region: Secondary | ICD-10-CM | POA: Diagnosis not present

## 2021-04-24 DIAGNOSIS — M5134 Other intervertebral disc degeneration, thoracic region: Secondary | ICD-10-CM | POA: Diagnosis not present

## 2021-04-29 DIAGNOSIS — M5134 Other intervertebral disc degeneration, thoracic region: Secondary | ICD-10-CM | POA: Diagnosis not present

## 2021-04-29 DIAGNOSIS — M50322 Other cervical disc degeneration at C5-C6 level: Secondary | ICD-10-CM | POA: Diagnosis not present

## 2021-04-29 DIAGNOSIS — M9901 Segmental and somatic dysfunction of cervical region: Secondary | ICD-10-CM | POA: Diagnosis not present

## 2021-04-29 DIAGNOSIS — M9902 Segmental and somatic dysfunction of thoracic region: Secondary | ICD-10-CM | POA: Diagnosis not present

## 2021-05-07 DIAGNOSIS — M9901 Segmental and somatic dysfunction of cervical region: Secondary | ICD-10-CM | POA: Diagnosis not present

## 2021-05-07 DIAGNOSIS — M9902 Segmental and somatic dysfunction of thoracic region: Secondary | ICD-10-CM | POA: Diagnosis not present

## 2021-05-07 DIAGNOSIS — M50322 Other cervical disc degeneration at C5-C6 level: Secondary | ICD-10-CM | POA: Diagnosis not present

## 2021-05-07 DIAGNOSIS — M5134 Other intervertebral disc degeneration, thoracic region: Secondary | ICD-10-CM | POA: Diagnosis not present

## 2021-06-04 ENCOUNTER — Encounter: Payer: Self-pay | Admitting: *Deleted

## 2021-06-23 DIAGNOSIS — M546 Pain in thoracic spine: Secondary | ICD-10-CM | POA: Diagnosis not present

## 2021-06-23 DIAGNOSIS — M9903 Segmental and somatic dysfunction of lumbar region: Secondary | ICD-10-CM | POA: Diagnosis not present

## 2021-06-23 DIAGNOSIS — M545 Low back pain, unspecified: Secondary | ICD-10-CM | POA: Diagnosis not present

## 2021-06-23 DIAGNOSIS — M62838 Other muscle spasm: Secondary | ICD-10-CM | POA: Diagnosis not present

## 2021-06-23 DIAGNOSIS — M62830 Muscle spasm of back: Secondary | ICD-10-CM | POA: Diagnosis not present

## 2021-06-23 DIAGNOSIS — M9902 Segmental and somatic dysfunction of thoracic region: Secondary | ICD-10-CM | POA: Diagnosis not present

## 2021-06-23 DIAGNOSIS — M9901 Segmental and somatic dysfunction of cervical region: Secondary | ICD-10-CM | POA: Diagnosis not present

## 2021-06-23 DIAGNOSIS — M542 Cervicalgia: Secondary | ICD-10-CM | POA: Diagnosis not present

## 2021-06-25 DIAGNOSIS — M62830 Muscle spasm of back: Secondary | ICD-10-CM | POA: Diagnosis not present

## 2021-06-25 DIAGNOSIS — M542 Cervicalgia: Secondary | ICD-10-CM | POA: Diagnosis not present

## 2021-06-25 DIAGNOSIS — M545 Low back pain, unspecified: Secondary | ICD-10-CM | POA: Diagnosis not present

## 2021-06-25 DIAGNOSIS — M546 Pain in thoracic spine: Secondary | ICD-10-CM | POA: Diagnosis not present

## 2021-06-25 DIAGNOSIS — M9902 Segmental and somatic dysfunction of thoracic region: Secondary | ICD-10-CM | POA: Diagnosis not present

## 2021-06-25 DIAGNOSIS — M9901 Segmental and somatic dysfunction of cervical region: Secondary | ICD-10-CM | POA: Diagnosis not present

## 2021-06-25 DIAGNOSIS — M9903 Segmental and somatic dysfunction of lumbar region: Secondary | ICD-10-CM | POA: Diagnosis not present

## 2021-06-25 DIAGNOSIS — M62838 Other muscle spasm: Secondary | ICD-10-CM | POA: Diagnosis not present

## 2021-06-26 ENCOUNTER — Encounter: Payer: Self-pay | Admitting: Emergency Medicine

## 2021-07-16 DIAGNOSIS — M62830 Muscle spasm of back: Secondary | ICD-10-CM | POA: Diagnosis not present

## 2021-07-16 DIAGNOSIS — M9902 Segmental and somatic dysfunction of thoracic region: Secondary | ICD-10-CM | POA: Diagnosis not present

## 2021-07-16 DIAGNOSIS — M542 Cervicalgia: Secondary | ICD-10-CM | POA: Diagnosis not present

## 2021-07-16 DIAGNOSIS — M546 Pain in thoracic spine: Secondary | ICD-10-CM | POA: Diagnosis not present

## 2021-07-16 DIAGNOSIS — M9903 Segmental and somatic dysfunction of lumbar region: Secondary | ICD-10-CM | POA: Diagnosis not present

## 2021-07-16 DIAGNOSIS — M9901 Segmental and somatic dysfunction of cervical region: Secondary | ICD-10-CM | POA: Diagnosis not present

## 2021-07-16 DIAGNOSIS — M62838 Other muscle spasm: Secondary | ICD-10-CM | POA: Diagnosis not present

## 2021-07-16 DIAGNOSIS — M545 Low back pain, unspecified: Secondary | ICD-10-CM | POA: Diagnosis not present

## 2021-07-17 DIAGNOSIS — M62830 Muscle spasm of back: Secondary | ICD-10-CM | POA: Diagnosis not present

## 2021-07-17 DIAGNOSIS — M9901 Segmental and somatic dysfunction of cervical region: Secondary | ICD-10-CM | POA: Diagnosis not present

## 2021-07-17 DIAGNOSIS — M546 Pain in thoracic spine: Secondary | ICD-10-CM | POA: Diagnosis not present

## 2021-07-17 DIAGNOSIS — M542 Cervicalgia: Secondary | ICD-10-CM | POA: Diagnosis not present

## 2021-07-17 DIAGNOSIS — M545 Low back pain, unspecified: Secondary | ICD-10-CM | POA: Diagnosis not present

## 2021-07-17 DIAGNOSIS — M9902 Segmental and somatic dysfunction of thoracic region: Secondary | ICD-10-CM | POA: Diagnosis not present

## 2021-07-17 DIAGNOSIS — M62838 Other muscle spasm: Secondary | ICD-10-CM | POA: Diagnosis not present

## 2021-07-17 DIAGNOSIS — M9903 Segmental and somatic dysfunction of lumbar region: Secondary | ICD-10-CM | POA: Diagnosis not present

## 2021-07-22 DIAGNOSIS — M62838 Other muscle spasm: Secondary | ICD-10-CM | POA: Diagnosis not present

## 2021-07-22 DIAGNOSIS — M9901 Segmental and somatic dysfunction of cervical region: Secondary | ICD-10-CM | POA: Diagnosis not present

## 2021-07-22 DIAGNOSIS — Z23 Encounter for immunization: Secondary | ICD-10-CM | POA: Diagnosis not present

## 2021-07-22 DIAGNOSIS — M9903 Segmental and somatic dysfunction of lumbar region: Secondary | ICD-10-CM | POA: Diagnosis not present

## 2021-07-22 DIAGNOSIS — M9902 Segmental and somatic dysfunction of thoracic region: Secondary | ICD-10-CM | POA: Diagnosis not present

## 2021-07-22 DIAGNOSIS — M546 Pain in thoracic spine: Secondary | ICD-10-CM | POA: Diagnosis not present

## 2021-07-22 DIAGNOSIS — M545 Low back pain, unspecified: Secondary | ICD-10-CM | POA: Diagnosis not present

## 2021-07-22 DIAGNOSIS — M62830 Muscle spasm of back: Secondary | ICD-10-CM | POA: Diagnosis not present

## 2021-07-22 DIAGNOSIS — M542 Cervicalgia: Secondary | ICD-10-CM | POA: Diagnosis not present

## 2021-07-23 DIAGNOSIS — M542 Cervicalgia: Secondary | ICD-10-CM | POA: Diagnosis not present

## 2021-07-23 DIAGNOSIS — M9901 Segmental and somatic dysfunction of cervical region: Secondary | ICD-10-CM | POA: Diagnosis not present

## 2021-07-23 DIAGNOSIS — M545 Low back pain, unspecified: Secondary | ICD-10-CM | POA: Diagnosis not present

## 2021-07-23 DIAGNOSIS — M9903 Segmental and somatic dysfunction of lumbar region: Secondary | ICD-10-CM | POA: Diagnosis not present

## 2021-07-23 DIAGNOSIS — M62838 Other muscle spasm: Secondary | ICD-10-CM | POA: Diagnosis not present

## 2021-07-23 DIAGNOSIS — M546 Pain in thoracic spine: Secondary | ICD-10-CM | POA: Diagnosis not present

## 2021-07-23 DIAGNOSIS — M9902 Segmental and somatic dysfunction of thoracic region: Secondary | ICD-10-CM | POA: Diagnosis not present

## 2021-07-23 DIAGNOSIS — M62830 Muscle spasm of back: Secondary | ICD-10-CM | POA: Diagnosis not present

## 2021-07-28 DIAGNOSIS — M9902 Segmental and somatic dysfunction of thoracic region: Secondary | ICD-10-CM | POA: Diagnosis not present

## 2021-07-28 DIAGNOSIS — M9903 Segmental and somatic dysfunction of lumbar region: Secondary | ICD-10-CM | POA: Diagnosis not present

## 2021-07-28 DIAGNOSIS — M542 Cervicalgia: Secondary | ICD-10-CM | POA: Diagnosis not present

## 2021-07-28 DIAGNOSIS — M546 Pain in thoracic spine: Secondary | ICD-10-CM | POA: Diagnosis not present

## 2021-07-28 DIAGNOSIS — M62830 Muscle spasm of back: Secondary | ICD-10-CM | POA: Diagnosis not present

## 2021-07-28 DIAGNOSIS — M62838 Other muscle spasm: Secondary | ICD-10-CM | POA: Diagnosis not present

## 2021-07-28 DIAGNOSIS — M9901 Segmental and somatic dysfunction of cervical region: Secondary | ICD-10-CM | POA: Diagnosis not present

## 2021-07-28 DIAGNOSIS — M545 Low back pain, unspecified: Secondary | ICD-10-CM | POA: Diagnosis not present

## 2021-07-30 DIAGNOSIS — M62838 Other muscle spasm: Secondary | ICD-10-CM | POA: Diagnosis not present

## 2021-07-30 DIAGNOSIS — M542 Cervicalgia: Secondary | ICD-10-CM | POA: Diagnosis not present

## 2021-07-30 DIAGNOSIS — M9903 Segmental and somatic dysfunction of lumbar region: Secondary | ICD-10-CM | POA: Diagnosis not present

## 2021-07-30 DIAGNOSIS — M9902 Segmental and somatic dysfunction of thoracic region: Secondary | ICD-10-CM | POA: Diagnosis not present

## 2021-07-30 DIAGNOSIS — M62830 Muscle spasm of back: Secondary | ICD-10-CM | POA: Diagnosis not present

## 2021-07-30 DIAGNOSIS — M545 Low back pain, unspecified: Secondary | ICD-10-CM | POA: Diagnosis not present

## 2021-07-30 DIAGNOSIS — M546 Pain in thoracic spine: Secondary | ICD-10-CM | POA: Diagnosis not present

## 2021-07-30 DIAGNOSIS — M9901 Segmental and somatic dysfunction of cervical region: Secondary | ICD-10-CM | POA: Diagnosis not present

## 2021-07-31 ENCOUNTER — Ambulatory Visit: Payer: Medicare Other | Admitting: Emergency Medicine

## 2021-07-31 DIAGNOSIS — M545 Low back pain, unspecified: Secondary | ICD-10-CM | POA: Diagnosis not present

## 2021-07-31 DIAGNOSIS — M9902 Segmental and somatic dysfunction of thoracic region: Secondary | ICD-10-CM | POA: Diagnosis not present

## 2021-07-31 DIAGNOSIS — M9903 Segmental and somatic dysfunction of lumbar region: Secondary | ICD-10-CM | POA: Diagnosis not present

## 2021-07-31 DIAGNOSIS — M62838 Other muscle spasm: Secondary | ICD-10-CM | POA: Diagnosis not present

## 2021-07-31 DIAGNOSIS — M542 Cervicalgia: Secondary | ICD-10-CM | POA: Diagnosis not present

## 2021-07-31 DIAGNOSIS — M9901 Segmental and somatic dysfunction of cervical region: Secondary | ICD-10-CM | POA: Diagnosis not present

## 2021-07-31 DIAGNOSIS — M62830 Muscle spasm of back: Secondary | ICD-10-CM | POA: Diagnosis not present

## 2021-07-31 DIAGNOSIS — M546 Pain in thoracic spine: Secondary | ICD-10-CM | POA: Diagnosis not present

## 2021-08-04 DIAGNOSIS — M9903 Segmental and somatic dysfunction of lumbar region: Secondary | ICD-10-CM | POA: Diagnosis not present

## 2021-08-04 DIAGNOSIS — M545 Low back pain, unspecified: Secondary | ICD-10-CM | POA: Diagnosis not present

## 2021-08-04 DIAGNOSIS — M546 Pain in thoracic spine: Secondary | ICD-10-CM | POA: Diagnosis not present

## 2021-08-04 DIAGNOSIS — M62838 Other muscle spasm: Secondary | ICD-10-CM | POA: Diagnosis not present

## 2021-08-04 DIAGNOSIS — M9901 Segmental and somatic dysfunction of cervical region: Secondary | ICD-10-CM | POA: Diagnosis not present

## 2021-08-04 DIAGNOSIS — M62830 Muscle spasm of back: Secondary | ICD-10-CM | POA: Diagnosis not present

## 2021-08-04 DIAGNOSIS — M542 Cervicalgia: Secondary | ICD-10-CM | POA: Diagnosis not present

## 2021-08-04 DIAGNOSIS — M9902 Segmental and somatic dysfunction of thoracic region: Secondary | ICD-10-CM | POA: Diagnosis not present

## 2021-08-06 DIAGNOSIS — M62838 Other muscle spasm: Secondary | ICD-10-CM | POA: Diagnosis not present

## 2021-08-06 DIAGNOSIS — M9902 Segmental and somatic dysfunction of thoracic region: Secondary | ICD-10-CM | POA: Diagnosis not present

## 2021-08-06 DIAGNOSIS — M62830 Muscle spasm of back: Secondary | ICD-10-CM | POA: Diagnosis not present

## 2021-08-06 DIAGNOSIS — M542 Cervicalgia: Secondary | ICD-10-CM | POA: Diagnosis not present

## 2021-08-06 DIAGNOSIS — M9901 Segmental and somatic dysfunction of cervical region: Secondary | ICD-10-CM | POA: Diagnosis not present

## 2021-08-06 DIAGNOSIS — M546 Pain in thoracic spine: Secondary | ICD-10-CM | POA: Diagnosis not present

## 2021-08-06 DIAGNOSIS — M9903 Segmental and somatic dysfunction of lumbar region: Secondary | ICD-10-CM | POA: Diagnosis not present

## 2021-08-06 DIAGNOSIS — M545 Low back pain, unspecified: Secondary | ICD-10-CM | POA: Diagnosis not present

## 2021-08-07 DIAGNOSIS — M9902 Segmental and somatic dysfunction of thoracic region: Secondary | ICD-10-CM | POA: Diagnosis not present

## 2021-08-07 DIAGNOSIS — M62838 Other muscle spasm: Secondary | ICD-10-CM | POA: Diagnosis not present

## 2021-08-07 DIAGNOSIS — M545 Low back pain, unspecified: Secondary | ICD-10-CM | POA: Diagnosis not present

## 2021-08-07 DIAGNOSIS — M546 Pain in thoracic spine: Secondary | ICD-10-CM | POA: Diagnosis not present

## 2021-08-07 DIAGNOSIS — M62830 Muscle spasm of back: Secondary | ICD-10-CM | POA: Diagnosis not present

## 2021-08-07 DIAGNOSIS — M542 Cervicalgia: Secondary | ICD-10-CM | POA: Diagnosis not present

## 2021-08-07 DIAGNOSIS — M9903 Segmental and somatic dysfunction of lumbar region: Secondary | ICD-10-CM | POA: Diagnosis not present

## 2021-08-07 DIAGNOSIS — M9901 Segmental and somatic dysfunction of cervical region: Secondary | ICD-10-CM | POA: Diagnosis not present

## 2021-08-11 DIAGNOSIS — M62830 Muscle spasm of back: Secondary | ICD-10-CM | POA: Diagnosis not present

## 2021-08-11 DIAGNOSIS — M9902 Segmental and somatic dysfunction of thoracic region: Secondary | ICD-10-CM | POA: Diagnosis not present

## 2021-08-11 DIAGNOSIS — M546 Pain in thoracic spine: Secondary | ICD-10-CM | POA: Diagnosis not present

## 2021-08-11 DIAGNOSIS — M9901 Segmental and somatic dysfunction of cervical region: Secondary | ICD-10-CM | POA: Diagnosis not present

## 2021-08-11 DIAGNOSIS — M545 Low back pain, unspecified: Secondary | ICD-10-CM | POA: Diagnosis not present

## 2021-08-11 DIAGNOSIS — M542 Cervicalgia: Secondary | ICD-10-CM | POA: Diagnosis not present

## 2021-08-11 DIAGNOSIS — M9903 Segmental and somatic dysfunction of lumbar region: Secondary | ICD-10-CM | POA: Diagnosis not present

## 2021-08-11 DIAGNOSIS — M62838 Other muscle spasm: Secondary | ICD-10-CM | POA: Diagnosis not present

## 2021-08-13 ENCOUNTER — Other Ambulatory Visit: Payer: Self-pay

## 2021-08-13 ENCOUNTER — Telehealth: Payer: Self-pay | Admitting: Emergency Medicine

## 2021-08-13 ENCOUNTER — Ambulatory Visit (INDEPENDENT_AMBULATORY_CARE_PROVIDER_SITE_OTHER): Payer: Medicare Other | Admitting: Emergency Medicine

## 2021-08-13 ENCOUNTER — Encounter: Payer: Self-pay | Admitting: Emergency Medicine

## 2021-08-13 VITALS — BP 136/80 | HR 80 | Temp 98.6°F | Ht 69.0 in | Wt 160.0 lb

## 2021-08-13 DIAGNOSIS — G4700 Insomnia, unspecified: Secondary | ICD-10-CM | POA: Diagnosis not present

## 2021-08-13 DIAGNOSIS — I700 Atherosclerosis of aorta: Secondary | ICD-10-CM | POA: Diagnosis not present

## 2021-08-13 DIAGNOSIS — J449 Chronic obstructive pulmonary disease, unspecified: Secondary | ICD-10-CM | POA: Diagnosis not present

## 2021-08-13 DIAGNOSIS — E785 Hyperlipidemia, unspecified: Secondary | ICD-10-CM

## 2021-08-13 DIAGNOSIS — M542 Cervicalgia: Secondary | ICD-10-CM | POA: Diagnosis not present

## 2021-08-13 DIAGNOSIS — Z8619 Personal history of other infectious and parasitic diseases: Secondary | ICD-10-CM | POA: Insufficient documentation

## 2021-08-13 DIAGNOSIS — I2510 Atherosclerotic heart disease of native coronary artery without angina pectoris: Secondary | ICD-10-CM

## 2021-08-13 MED ORDER — CYCLOBENZAPRINE HCL 10 MG PO TABS
10.0000 mg | ORAL_TABLET | Freq: Every day | ORAL | 1 refills | Status: DC
Start: 2021-08-13 — End: 2022-10-30

## 2021-08-13 MED ORDER — ESZOPICLONE 3 MG PO TABS: 3.0000 mg | ORAL_TABLET | Freq: Every day | ORAL | 3 refills | Status: DC

## 2021-08-13 MED ORDER — ACYCLOVIR 200 MG PO CAPS: 200.0000 mg | ORAL_CAPSULE | Freq: Four times a day (QID) | ORAL | 5 refills | Status: DC

## 2021-08-13 NOTE — Assessment & Plan Note (Signed)
Stable.  Asymptomatic.  Not on any inhaler by choice.

## 2021-08-13 NOTE — Patient Instructions (Signed)
Health Maintenance After Age 75 After age 75, you are at a higher risk for certain long-term diseases and infections as well as injuries from falls. Falls are a major cause of broken bones and head injuries in people who are older than age 75. Getting regular preventive care can help to keep you healthy and well. Preventive care includes getting regular testing and making lifestyle changes as recommended by your health care provider. Talk with your health care provider about: Which screenings and tests you should have. A screening is a test that checks for a disease when you have no symptoms. A diet and exercise plan that is right for you. What should I know about screenings and tests to prevent falls? Screening and testing are the best ways to find a health problem early. Early diagnosis and treatment give you the best chance of managing medical conditions that are common after age 75. Certain conditions and lifestyle choices may make you more likely to have a fall. Your health care provider may recommend: Regular vision checks. Poor vision and conditions such as cataracts can make you more likely to have a fall. If you wear glasses, make sure to get your prescription updated if your vision changes. Medicine review. Work with your health care provider to regularly review all of the medicines you are taking, including over-the-counter medicines. Ask your health care provider about any side effects that may make you more likely to have a fall. Tell your health care provider if any medicines that you take make you feel dizzy or sleepy. Strength and balance checks. Your health care provider may recommend certain tests to check your strength and balance while standing, walking, or changing positions. Foot health exam. Foot pain and numbness, as well as not wearing proper footwear, can make you more likely to have a fall. Screenings, including: Osteoporosis screening. Osteoporosis is a condition that causes  the bones to get weaker and break more easily. Blood pressure screening. Blood pressure changes and medicines to control blood pressure can make you feel dizzy. Depression screening. You may be more likely to have a fall if you have a fear of falling, feel depressed, or feel unable to do activities that you used to do. Alcohol use screening. Using too much alcohol can affect your balance and may make you more likely to have a fall. Follow these instructions at home: Lifestyle Do not drink alcohol if: Your health care provider tells you not to drink. If you drink alcohol: Limit how much you have to: 0-1 drink a day for women. 0-2 drinks a day for men. Know how much alcohol is in your drink. In the [place] one drink equals one 12 oz bottle of beer (355 mL), one 5 oz glass of wine (148 mL), or one 1 oz glass of hard liquor (44 mL). Do not use any products that contain nicotine or tobacco. These products include cigarettes, chewing tobacco, and vaping devices, such as e-cigarettes. If you need help quitting, ask your health care provider. Activity  Follow a regular exercise program to stay fit. This will help you maintain your balance. Ask your health care provider what types of exercise are appropriate for you. If you need a cane or walker, use it as recommended by your health care provider. Wear supportive shoes that have nonskid soles. Safety  Remove any tripping hazards, such as rugs, cords, and clutter. Install safety equipment such as grab bars in bathrooms and safety rails on stairs. Keep rooms and walkways   well-lit. General instructions Talk with your health care provider about your risks for falling. Tell your health care provider if: You fall. Be sure to tell your health care provider about all falls, even ones that seem minor. You feel dizzy, tiredness (fatigue), or off-balance. Take over-the-counter and prescription medicines only as told by your health care provider. These include  supplements. Eat a healthy diet and maintain a healthy weight. A healthy diet includes low-fat dairy products, low-fat (lean) meats, and fiber from whole grains, beans, and lots of fruits and vegetables. Stay current with your vaccines. Schedule regular health, dental, and eye exams. Summary Having a healthy lifestyle and getting preventive care can help to protect your health and wellness after age 75. Screening and testing are the best way to find a health problem early and help you avoid having a fall. Early diagnosis and treatment give you the best chance for managing medical conditions that are more common for people who are older than age 75. Falls are a major cause of broken bones and head injuries in people who are older than age 75. Take precautions to prevent a fall at home. Work with your health care provider to learn what changes you can make to improve your health and wellness and to prevent falls. This information is not intended to replace advice given to you by your health care provider. Make sure you discuss any questions you have with your health care provider. Document Revised: 02/10/2021 Document Reviewed: 02/10/2021 Elsevier Patient Education  2022 [employer]  

## 2021-08-13 NOTE — Assessment & Plan Note (Addendum)
Not active.  Requesting prescription for acyclovir to use for flareups.

## 2021-08-13 NOTE — Progress Notes (Signed)
Kevin Shepard 75 y.o.   Chief Complaint  Patient presents with   Hyperlipidemia    6 month F/U     HISTORY OF PRESENT ILLNESS: This is a 75 y.o. male here for follow-up of chronic medical problems including hyperlipidemia. 1.  Chronic insomnia: Requesting refill on Lunesta 3 mg 2.  Dyslipidemia: Takes Crestor 10 mg daily 3.  Left neck strain over the past several weeks.  Denies injury.  Mostly complaining of pain and spasm to left upper back and left neck area. 4.  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  Requesting prescription for acyclovir. Since last visit was able to see cardiologist.  Had normal stress test.  Also had normal abdominal ultrasound.  No AAA. Other complaints or medical concerns today. Cardiologist's last office visit assessment and plan as follows:  ASSESSMENT AND PLAN:   ELEVATED CORONARY CALCIUM: Because of this I will order a POET (Plain Old Exercise Treadmill).  He does have some mild shortness of breath with exertion.  Further evaluation will be based on these results.   AAA SCREENING: He is going to get an ultrasound of the abdomen for screening purposes as he is over 65 and has a smoking history.  He has not been screened.   Current medicines are reviewed at length with the patient today.  The patient does not have concerns regarding medicines.   The following changes have been made:  no change   Labs/ tests ordered today include:       Orders Placed This Encounter  Procedures   EXERCISE TOLERANCE TEST (ETT)   EKG 12-Lead   VAS [place] AAA DUPLEX        Disposition:   FU with me as needed based on results of the above     Signed, Minus Breeding, MD  03/26/2021 3:39 PM    [facility] Hyperlipidemia Pertinent negatives include no chest pain or shortness of breath.    Prior to Admission medications   Medication Sig Start Date End Date Taking? Authorizing Provider  cyclobenzaprine (FLEXERIL) 10 MG tablet Take 1  tablet (10 mg total) by mouth at bedtime. 08/13/21  Yes Horald Pollen, MD  PREVIDENT 5000 BOOSTER PLUS 1.1 % PSTE SMARTSIG:Sparingly To Teeth 05/14/21  Yes [provider]  rosuvastatin (CRESTOR) 10 MG tablet Take 1 tablet (10 mg total) by mouth daily. 01/29/21  Yes [place] Lesure, Ines Bloomer, MD  acyclovir (ZOVIRAX) 200 MG capsule Take 1 capsule (200 mg total) by mouth 4 (four) times daily for 7 days. At time of flare-up. 08/13/21 08/20/21  Horald Pollen, MD  Eszopiclone 3 MG TABS Take 1 tablet (3 mg total) by mouth at bedtime. Take immediately before bedtime as needed. 08/13/21   Horald Pollen, MD    Not on File  Patient Active Problem List   Diagnosis Date Noted   Dyslipidemia 01/29/2021   Atherosclerosis of aorta ([place]) 01/29/2021   Insomnia 01/29/2021   Atherosclerosis of native coronary artery of native heart without angina pectoris 01/29/2021   Seborrheic keratoses 04/11/2020   Actinic keratoses 04/11/2020   COPD  GOLD II/ Group A 06/02/2018   Left upper lobe pulmonary nodule 06/02/2018   Former smoker 04/06/2018   Alcohol use 01/10/2018   Chronic bilateral low back pain without sciatica 01/10/2018   History of tobacco use 01/10/2018   History of back surgery 01/10/2018    Past Medical History:  Diagnosis Date   COPD (chronic obstructive pulmonary disease) ([place])  Hyperlipidemia     Past Surgical History:  Procedure Laterality Date   APPENDECTOMY     per patient 15's   FRACTURE SURGERY  1974   right clavicle   LUMBAR SPINE SURGERY  2018   SPINE SURGERY  10/2016   TONSILLECTOMY      Social History   Socioeconomic History   Marital status: Married    Spouse name: Not on file   Number of children: Not on file   Years of education: Not on file   Highest education level: Not on file  Occupational History   Occupation: stock market trading  Tobacco Use   Smoking status: Former    Packs/day: 1.00    Years: 40.00    Pack years: 40.00     Types: Cigarettes    Quit date: 10/04/2017    Years since quitting: 3.8   Smokeless tobacco: Never  Substance and Sexual Activity   Alcohol use: Yes   Drug use: Never   Sexual activity: Yes    Partners: Female  Other Topics Concern   Not on file  Social History Narrative   Not on file   Social Determinants of Health   Financial Resource Strain: Not on file  Food Insecurity: Not on file  Transportation Needs: Not on file  Physical Activity: Not on file  Stress: Not on file  Social Connections: Not on file  Intimate Partner Violence: Not on file    Family History  Problem Relation Age of Onset   Cancer Mother        Breast   Early death Father    Heart disease Father 24       rheumatic heart disease   Hyperlipidemia Sister      Review of Systems  Constitutional: Negative.  Negative for chills and fever.  HENT: Negative.  Negative for congestion and sore throat.   Respiratory: Negative.  Negative for cough and shortness of breath.   Cardiovascular: Negative.  Negative for chest pain and palpitations.  Gastrointestinal:  Negative for abdominal pain, diarrhea, nausea and vomiting.  Genitourinary: Negative.   Skin: Negative.  Negative for rash.  Neurological: Negative.  Negative for dizziness and headaches.  All other systems reviewed and are negative.  Today's Vitals   08/13/21 1001  BP: 136/80  Pulse: 80  Temp: 98.6 F (37 C)  TempSrc: Oral  SpO2: 98%  Weight: 160 lb (72.6 kg)  Height: 5\' 9"  (1.753 m)   Body mass index is 23.63 kg/m.  Physical Exam Vitals reviewed.  Constitutional:      Appearance: Normal appearance.  HENT:     Head: Normocephalic.  Eyes:     Extraocular Movements: Extraocular movements intact.     Conjunctiva/sclera: Conjunctivae normal.     Pupils: Pupils are equal, round, and reactive to light.  Cardiovascular:     Rate and Rhythm: Normal rate and regular rhythm.     Pulses: Normal pulses.     Heart sounds: Normal heart  sounds.  Pulmonary:     Effort: Pulmonary effort is normal.     Breath sounds: Normal breath sounds.  Musculoskeletal:        General: Normal range of motion.     Cervical back: Pain with movement and muscular tenderness present.  Lymphadenopathy:     Cervical: No cervical adenopathy.  Skin:    General: Skin is warm and dry.     Capillary Refill: Capillary refill takes less than 2 seconds.  Neurological:  General: No focal deficit present.     Mental Status: He is alert and oriented to person, place, and time.  Psychiatric:        Mood and Affect: Mood normal.        Behavior: Behavior normal.     ASSESSMENT & PLAN: Problem List Items Addressed This Visit       Cardiovascular and Mediastinum   Atherosclerosis of aorta (H[facility])    Stable.  On rosuvastatin 10 mg daily.        Respiratory   COPD  GOLD II/ Group A    Stable.  Asymptomatic.  Not on any inhaler by choice.        Other   Dyslipidemia    Stable.  On rosuvastatin 10 mg daily.      Insomnia    Stable.  Responding well to Lunesta 3 mg at bedtime.  Refill provided.      Relevant Medications   Eszopiclone 3 MG TABS   Musculoskeletal neck pain - Primary    Recommended to use heat pad and Flexeril at bedtime.      Relevant Medications   cyclobenzaprine (FLEXERIL) 10 MG tablet   History of herpes simplex infection    Not active.  Requesting prescription for acyclovir to use for flareups.      Relevant Medications   acyclovir (ZOVIRAX) 200 MG capsule    Patient Instructions  Health Maintenance After Age 86 After age 5, you are at a higher risk for certain long-term diseases and infections as well as injuries from falls. Falls are a major cause of broken bones and head injuries in people who are older than age 3. Getting regular preventive care can help to keep you healthy and well. Preventive care includes getting regular testing and making lifestyle changes as recommended by your health care  provider. Talk with your health care provider about: Which screenings and tests you should have. A screening is a test that checks for a disease when you have no symptoms. A diet and exercise plan that is right for you. What should I know about screenings and tests to prevent falls? Screening and testing are the best ways to find a health problem early. Early diagnosis and treatment give you the best chance of managing medical conditions that are common after age 72. Certain conditions and lifestyle choices may make you more likely to have a fall. Your health care provider may recommend: Regular vision checks. Poor vision and conditions such as cataracts can make you more likely to have a fall. If you wear glasses, make sure to get your prescription updated if your vision changes. Medicine review. Work with your health care provider to regularly review all of the medicines you are taking, including over-the-counter medicines. Ask your health care provider about any side effects that may make you more likely to have a fall. Tell your health care provider if any medicines that you take make you feel dizzy or sleepy. Strength and balance checks. Your health care provider may recommend certain tests to check your strength and balance while standing, walking, or changing positions. Foot health exam. Foot pain and numbness, as well as not wearing proper footwear, can make you more likely to have a fall. Screenings, including: Osteoporosis screening. Osteoporosis is a condition that causes the bones to get weaker and break more easily. Blood pressure screening. Blood pressure changes and medicines to control blood pressure can make you feel dizzy. Depression screening. You may be more likely  to have a fall if you have a fear of falling, feel depressed, or feel unable to do activities that you used to do. Alcohol use screening. Using too much alcohol can affect your balance and may make you more likely to have  a fall. Follow these instructions at home: Lifestyle Do not drink alcohol if: Your health care provider tells you not to drink. If you drink alcohol: Limit how much you have to: 0-1 drink a day for women. 0-2 drinks a day for men. Know how much alcohol is in your drink. In the [place] one drink equals one 12 oz bottle of beer (355 mL), one 5 oz glass of wine (148 mL), or one 1 oz glass of hard liquor (44 mL). Do not use any products that contain nicotine or tobacco. These products include cigarettes, chewing tobacco, and vaping devices, such as e-cigarettes. If you need help quitting, ask your health care provider. Activity  Follow a regular exercise program to stay fit. This will help you maintain your balance. Ask your health care provider what types of exercise are appropriate for you. If you need a cane or walker, use it as recommended by your health care provider. Wear supportive shoes that have nonskid soles. Safety  Remove any tripping hazards, such as rugs, cords, and clutter. Install safety equipment such as grab bars in bathrooms and safety rails on stairs. Keep rooms and walkways well-lit. General instructions Talk with your health care provider about your risks for falling. Tell your health care provider if: You fall. Be sure to tell your health care provider about all falls, even ones that seem minor. You feel dizzy, tiredness (fatigue), or off-balance. Take over-the-counter and prescription medicines only as told by your health care provider. These include supplements. Eat a healthy diet and maintain a healthy weight. A healthy diet includes low-fat dairy products, low-fat (lean) meats, and fiber from whole grains, beans, and lots of fruits and vegetables. Stay current with your vaccines. Schedule regular health, dental, and eye exams. Summary Having a healthy lifestyle and getting preventive care can help to protect your health and wellness after age 49. Screening and  testing are the best way to find a health problem early and help you avoid having a fall. Early diagnosis and treatment give you the best chance for managing medical conditions that are more common for people who are older than age 32. Falls are a major cause of broken bones and head injuries in people who are older than age 35. Take precautions to prevent a fall at home. Work with your health care provider to learn what changes you can make to improve your health and wellness and to prevent falls. This information is not intended to replace advice given to you by your health care provider. Make sure you discuss any questions you have with your health care provider. Document Revised: 02/10/2021 Document Reviewed: 02/10/2021 Elsevier Patient Education  2022 [place], [place] [facility]

## 2021-08-13 NOTE — Assessment & Plan Note (Signed)
Stable.  Responding well to Lunesta 3 mg at bedtime.  Refill provided.

## 2021-08-13 NOTE — Assessment & Plan Note (Signed)
Stable.  On rosuvastatin 10 mg daily.

## 2021-08-13 NOTE — Assessment & Plan Note (Signed)
Recommended to use heat pad and Flexeril at bedtime.

## 2021-12-07 ENCOUNTER — Encounter: Payer: Self-pay | Admitting: Emergency Medicine

## 2021-12-22 NOTE — Telephone Encounter (Signed)
Called patient and left message for patient to call office in reference to the CT Chest Lung cancer screening  ? ?I called [facility] this CT was ordered by Dr. Elie Confer office. That office will call him closer to May to schedule this imaging for him.  ? ? ?

## 2022-01-12 ENCOUNTER — Ambulatory Visit: Payer: Medicare Other

## 2022-01-12 ENCOUNTER — Telehealth: Payer: Self-pay

## 2022-01-12 NOTE — Telephone Encounter (Signed)
Called x 3 with no answer, patient may reschedule for the next available appointment. ? ?L.Cassey Bacigalupo,LPN  ? ? ?

## 2022-02-01 ENCOUNTER — Other Ambulatory Visit: Payer: Self-pay | Admitting: Emergency Medicine

## 2022-02-01 DIAGNOSIS — E785 Hyperlipidemia, unspecified: Secondary | ICD-10-CM

## 2022-02-01 DIAGNOSIS — I700 Atherosclerosis of aorta: Secondary | ICD-10-CM

## 2022-02-10 ENCOUNTER — Other Ambulatory Visit: Payer: Self-pay | Admitting: Emergency Medicine

## 2022-02-10 ENCOUNTER — Ambulatory Visit (INDEPENDENT_AMBULATORY_CARE_PROVIDER_SITE_OTHER): Payer: Medicare Other | Admitting: Emergency Medicine

## 2022-02-10 ENCOUNTER — Encounter: Payer: Self-pay | Admitting: Emergency Medicine

## 2022-02-10 ENCOUNTER — Telehealth: Payer: Self-pay | Admitting: Emergency Medicine

## 2022-02-10 ENCOUNTER — Other Ambulatory Visit: Payer: Self-pay | Admitting: *Deleted

## 2022-02-10 VITALS — BP 122/74 | HR 58 | Temp 98.1°F | Wt 163.0 lb

## 2022-02-10 DIAGNOSIS — K5909 Other constipation: Secondary | ICD-10-CM

## 2022-02-10 DIAGNOSIS — M542 Cervicalgia: Secondary | ICD-10-CM | POA: Diagnosis not present

## 2022-02-10 DIAGNOSIS — E785 Hyperlipidemia, unspecified: Secondary | ICD-10-CM

## 2022-02-10 DIAGNOSIS — N401 Enlarged prostate with lower urinary tract symptoms: Secondary | ICD-10-CM | POA: Diagnosis not present

## 2022-02-10 DIAGNOSIS — G4700 Insomnia, unspecified: Secondary | ICD-10-CM

## 2022-02-10 DIAGNOSIS — R399 Unspecified symptoms and signs involving the genitourinary system: Secondary | ICD-10-CM | POA: Diagnosis not present

## 2022-02-10 DIAGNOSIS — J449 Chronic obstructive pulmonary disease, unspecified: Secondary | ICD-10-CM | POA: Diagnosis not present

## 2022-02-10 DIAGNOSIS — I700 Atherosclerosis of aorta: Secondary | ICD-10-CM

## 2022-02-10 DIAGNOSIS — N138 Other obstructive and reflux uropathy: Secondary | ICD-10-CM | POA: Diagnosis not present

## 2022-02-10 LAB — LIPID PANEL
Cholesterol: 146 mg/dL (ref 0–200)
HDL: 57.4 mg/dL (ref >39.00)
LDL Cholesterol: 76 mg/dL (ref 0–99)
NonHDL: 88.47
Total CHOL/HDL Ratio: 3
Triglycerides: 64 mg/dL (ref 0.0–149.0)
VLDL: 12.8 mg/dL (ref 0.0–40.0)

## 2022-02-10 LAB — CBC WITH DIFFERENTIAL/PLATELET
Basophils Absolute: 0 10*3/uL (ref 0.0–0.1)
Basophils Relative: 0.6 % (ref 0.0–3.0)
Eosinophils Absolute: 0.1 10*3/uL (ref 0.0–0.7)
Eosinophils Relative: 2.3 % (ref 0.0–5.0)
HCT: 39.7 % (ref 39.0–52.0)
Hemoglobin: 13.5 g/dL (ref 13.0–17.0)
Lymphocytes Relative: 29.9 % (ref 12.0–46.0)
Lymphs Abs: 1.5 10*3/uL (ref 0.7–4.0)
MCHC: 34 g/dL (ref 30.0–36.0)
MCV: 91.1 fl (ref 78.0–100.0)
Monocytes Absolute: 0.5 10*3/uL (ref 0.1–1.0)
Monocytes Relative: 8.8 % (ref 3.0–12.0)
Neutro Abs: 3 10*3/uL (ref 1.4–7.7)
Neutrophils Relative %: 58.4 % (ref 43.0–77.0)
Platelets: 261 10*3/uL (ref 150.0–400.0)
RBC: 4.35 Mil/uL (ref 4.22–5.81)
RDW: 12.9 % (ref 11.5–15.5)
WBC: 5.2 10*3/uL (ref 4.0–10.5)

## 2022-02-10 LAB — COMPREHENSIVE METABOLIC PANEL
ALT: 19 U/L (ref 0–53)
AST: 20 U/L (ref 0–37)
Albumin: 4.5 g/dL (ref 3.5–5.2)
Alkaline Phosphatase: 42 U/L (ref 39–117)
BUN: 15 mg/dL (ref 6–23)
CO2: 29 mEq/L (ref 19–32)
Calcium: 9.2 mg/dL (ref 8.4–10.5)
Chloride: 101 mEq/L (ref 96–112)
Creatinine, Ser: 1.02 mg/dL (ref 0.40–1.50)
GFR: 71.94 mL/min (ref >60.00)
Glucose, Bld: 83 mg/dL (ref 70–99)
Potassium: 4.5 mEq/L (ref 3.5–5.1)
Sodium: 138 mEq/L (ref 135–145)
Total Bilirubin: 0.7 mg/dL (ref 0.2–1.2)
Total Protein: 7.2 g/dL (ref 6.0–8.3)

## 2022-02-10 LAB — PSA: PSA: 3.2 ng/mL (ref 0.10–4.00)

## 2022-02-10 MED ORDER — TAMSULOSIN HCL 0.4 MG PO CAPS: 0.4000 mg | ORAL_CAPSULE | Freq: Every day | ORAL | 3 refills | Status: DC

## 2022-02-10 MED ORDER — LINACLOTIDE 145 MCG PO CAPS: 145.0000 ug | ORAL_CAPSULE | Freq: Every day | ORAL | 1 refills | Status: DC

## 2022-02-10 MED ORDER — ESZOPICLONE 3 MG PO TABS: 3.0000 mg | ORAL_TABLET | Freq: Every day | ORAL | 3 refills | Status: DC

## 2022-02-10 NOTE — Telephone Encounter (Signed)
Patient wants his prescriptions from this morning sent to [facility] [address] - he does not want to use [facility] ?

## 2022-02-10 NOTE — Assessment & Plan Note (Signed)
Stable.  Continue rosuvastatin 10 mg daily.  Lipid profile done today. ?

## 2022-02-10 NOTE — Progress Notes (Signed)
1st transmission failed..resent (fax) to pof.Marland KitchenRenaldo Harrison ?

## 2022-02-10 NOTE — Progress Notes (Signed)
Kevin Shepard ?76 y.o. ? ? ?Chief Complaint  ?Patient presents with  ? Follow-up  ? ? ?HISTORY OF PRESENT ILLNESS: ?This is a 76 y.o. male A1A here for 5-monthfollow-up on chronic medical problems. ?History of COPD doing well. ?History of atherosclerosis.  Doing well.  On rosuvastatin 10 mg daily. ?Has 2 complaints today: ?1.  Chronic constipation unresponsive to over-the-counter measures ?2.  Lower urinary tract symptoms with decreased flow and unable to empty full bladder. ?Urinates 1 or twice during the night. ?No other complaints or medical concerns today. ? ?HPI ? ? ?Prior to Admission medications   ?Medication Sig Start Date End Date Taking? Authorizing Provider  ?cyclobenzaprine (FLEXERIL) 10 MG tablet Take 1 tablet (10 mg total) by mouth at bedtime. 08/13/21  Yes Johnluke Haugen, MInes Bloomer MD  ?Eszopiclone 3 MG TABS Take 1 tablet (3 mg total) by mouth at bedtime. Take immediately before bedtime as needed. 08/13/21  Yes SHorald Pollen MD  ?PREVIDENT 5000 BOOSTER PLUS 1.1 % PSTE SMARTSIG:Sparingly To Teeth 05/14/21  Yes [provider]  ?rosuvastatin (CRESTOR) 10 MG tablet TAKE 1 TABLET(10 MG) BY MOUTH DAILY 02/02/22  Yes Briani Maul, MInes Bloomer MD  ?acyclovir (ZOVIRAX) 200 MG capsule Take 1 capsule (200 mg total) by mouth 4 (four) times daily for 7 days. At time of flare-up. 08/13/21 08/20/21  SHorald Pollen MD  ? ? ?No Known Allergies ? ?Patient Active Problem List  ? Diagnosis Date Noted  ? Musculoskeletal neck pain 08/13/2021  ? History of herpes simplex infection 08/13/2021  ? Dyslipidemia 01/29/2021  ? [place]ros[place] 01/29/2021  ? Insomnia 01/29/2021  ? Atherosclerosis of native coronary artery of native heart without angina pectoris 01/29/2021  ? Seborrheic keratoses 04/11/2020  ? Actinic keratoses 04/11/2020  ? COPD  GOLD II/ Group A 06/02/2018  ? Left upper lobe pulmonary nodule 06/02/2018  ? Former smoker 04/06/2018  ? Alcohol use 01/10/2018  ? Chronic bilateral low back  pain without sciatica 01/10/2018  ? History of tobacco use 01/10/2018  ? History of back surgery 01/10/2018  ? ? ?Past Medical History:  ?Diagnosis Date  ? COPD [place] obstructive [place] disease) ([place]   ? Hyperlipidemia   ? ? ?Past Surgical History:  ?Procedure Laterality Date  ? APPENDECTOMY    ? per patient 1950's  ? FRACTURE SURGERY  1974  ? right clavicle  ? LUMBAR SPINE SURGERY  2018  ? SPINE SURGERY  10/2016  ? TONSILLECTOMY    ? ? ?Social History  ? ?Socioeconomic History  ? Marital status: Married  ?  Spouse name: Not on file  ? Number of children: Not on file  ? Years of education: Not on file  ? Highest education level: Not on file  ?Occupational History  ? Occupation: sMerchandiser, retailtrading  ?Tobacco Use  ? Smoking status: Former  ?  Packs/day: 1.00  ?  Years: 40.00  ?  Pack years: 40.00  ?  Types: Cigarettes  ?  Quit date: 10/04/2017  ?  Years since quitting: 4.3  ? Smokeless tobacco: Never  ?Substance and Sexual Activity  ? Alcohol use: Yes  ? Drug use: Never  ? Sexual activity: Yes  ?  Partners: Female  ?Other Topics Concern  ? Not on file  ?Social History Narrative  ? Not on file  ? ?Social Determinants of Health  ? ?Financial Resource Strain: Not on file  ?Food Insecurity: Not on file  ?Transportation Needs: Not on file  ?Physical Activity: Not  on file  ?Stress: Not on file  ?Social Connections: Not on file  ?Intimate Partner Violence: Not on file  ? ? ?Family History  ?Problem Relation Age of Onset  ? Cancer Mother   ?     Breast  ? Early death Father   ? Heart disease Father 5  ?     rheumatic heart disease  ? Hyperlipidemia Sister   ? ? ? ?Review of Systems  ?Constitutional: Negative.  Negative for chills and fever.  ?HENT: Negative.  Negative for congestion and sore throat.   ?Respiratory: Negative.  Negative for cough and shortness of breath.   ?Cardiovascular: Negative.  Negative for chest pain and palpitations.  ?Gastrointestinal:  Positive for constipation. Negative for abdominal pain,  diarrhea, nausea and vomiting.  ?Genitourinary:  Negative for dysuria and hematuria.  ?Skin: Negative.  Negative for rash.  ?Neurological:  Negative for dizziness and headaches.  ?All other systems reviewed and are negative. ? ?Today's Vitals  ? 02/10/22 0906  ?BP: 122/74  ?Pulse: (!) 58  ?Temp: 98.1 ?F (36.7 ?C)  ?TempSrc: Oral  ?SpO2: 95%  ?Weight: 163 lb (73.9 kg)  ? ?Body mass index is 24.07 kg/m?. ?Wt Readings from Last 3 Encounters:  ?02/10/22 163 lb (73.9 kg)  ?08/13/21 160 lb (72.6 kg)  ?03/26/21 165 lb 9.6 oz (75.1 kg)  ? ? ?Physical Exam ?Vitals reviewed.  ?Constitutional:   ?   Appearance: Normal appearance.  ?HENT:  ?   Head: Normocephalic.  ?Eyes:  ?   Extraocular Movements: Extraocular movements intact.  ?   Pupils: Pupils are equal, round, and reactive to light.  ?Cardiovascular:  ?   Rate and Rhythm: Normal rate and regular rhythm.  ?   Pulses: Normal pulses.  ?   Heart sounds: Normal heart sounds.  ?Pulmonary:  ?   Effort: Pulmonary effort is normal.  ?   Breath sounds: Normal breath sounds.  ?Abdominal:  ?   General: There is no distension.  ?   Palpations: Abdomen is soft.  ?   Tenderness: There is no abdominal tenderness.  ?Musculoskeletal:  ?   Cervical back: No tenderness.  ?Lymphadenopathy:  ?   Cervical: No cervical adenopathy.  ?Skin: ?   General: Skin is warm and dry.  ?   Capillary Refill: Capillary refill takes less than 2 seconds.  ?Neurological:  ?   General: No focal deficit present.  ?   Mental Status: He is alert and oriented to person, place, and time.  ?Psychiatric:     ?   Mood and Affect: Mood normal.     ?   Behavior: Behavior normal.  ? ? ? ?ASSESSMENT & PLAN: ?A total of 49 minutes was spent with the patient and counseling/coordination of care regarding preparing for this visit, review of most recent office visit notes, review of most recent blood work results, review of multiple chronic medical problems and their management, review of all medications as well as new ones,  education on nutrition, health maintenance items, prognosis, documentation and need for follow-up. ? ?Problem List Items Addressed This Visit   ? ?  ? Cardiovascular and Mediastinum  ? Atherosclerosis of aorta ([place])  ?  Stable.  Continue rosuvastatin 10 mg daily.  Lipid profile done today. ? ?  ?  ?  ? Respiratory  ? COPD  GOLD II/ Group A  ?  Stable.  Quit smoking 2018.  Scheduled for chest CT next month.  Asymptomatic. ? ?  ?  ?  ?  Digestive  ? Chronic constipation - Primary  ?  Currently active despite over-the-counter measures. ?Diet and nutrition discussed.  Advised to increase fiber intake. ?May benefit from Linzess 145 mg daily. ? ?  ?  ? Relevant Medications  ? linaclotide (LINZESS) 145 MCG CAPS capsule  ? Other Relevant Orders  ? Comprehensive metabolic panel  ? CBC with Differential/Platelet  ?  ? Other  ? Dyslipidemia  ? Relevant Orders  ? Lipid panel  ? Insomnia  ?  Much improved with medication.  Continue eszopiclone 3 mg at bedtime. ? ?  ?  ? Relevant Medications  ? Eszopiclone 3 MG TABS  ? Musculoskeletal neck pain  ? Lower urinary tract symptoms  ?  Lower urinary tract symptoms affecting quality of life. ?May benefit from Flomax 0.4 mg at bedtime. ?May need urology evaluation later on. ?PSA done today. ? ?  ?  ? Relevant Medications  ? tamsulosin (FLOMAX) 0.4 MG CAPS capsule  ? Other Relevant Orders  ? PSA  ? CBC with Differential/Platelet  ? ?Other Visit Diagnoses   ? ? BPH with obstruction/lower urinary tract symptoms      ? Relevant Medications  ? tamsulosin (FLOMAX) 0.4 MG CAPS capsule  ? Other Relevant Orders  ? PSA  ? ?  ? ?Patient Instructions  ?Health Maintenance After Age 18 ?After age 23, you are at a higher risk for certain long-term diseases and infections as well as injuries from falls. Falls are a major cause of broken bones and head injuries in people who are older than age 7. Getting regular preventive care can help to keep you healthy and well. Preventive care includes getting  regular testing and making lifestyle changes as recommended by your health care provider. Talk with your health care provider about: ?Which screenings and tests you should have. A screening is a test that chec

## 2022-02-10 NOTE — Addendum Note (Signed)
Addended by: Rae Mar on: 02/10/2022 02:26 PM ? ? Modules accepted: Orders ? ?

## 2022-02-10 NOTE — Assessment & Plan Note (Signed)
Lower urinary tract symptoms affecting quality of life. ?May benefit from Flomax 0.4 mg at bedtime. ?May need urology evaluation later on. ?PSA done today. ?

## 2022-02-10 NOTE — Telephone Encounter (Signed)
Medications sent to pharmacy on file  ?

## 2022-02-10 NOTE — Telephone Encounter (Signed)
Can you please resend patient Eszopiclone? ?The pharmacy system was down.  ? ?Thank you  ?

## 2022-02-10 NOTE — Telephone Encounter (Signed)
Resent. Thank you.

## 2022-02-10 NOTE — Assessment & Plan Note (Signed)
Much improved with medication.  Continue eszopiclone 3 mg at bedtime. ?

## 2022-02-10 NOTE — Assessment & Plan Note (Signed)
Stable.  Quit smoking 2018.  Scheduled for chest CT next month.  Asymptomatic. ?

## 2022-02-10 NOTE — Patient Instructions (Signed)
Health Maintenance After Age 76 After age 76, you are at a higher risk for certain long-term diseases and infections as well as injuries from falls. Falls are a major cause of broken bones and head injuries in people who are older than age 76. Getting regular preventive care can help to keep you healthy and well. Preventive care includes getting regular testing and making lifestyle changes as recommended by your health care provider. Talk with your health care provider about: Which screenings and tests you should have. A screening is a test that checks for a disease when you have no symptoms. A diet and exercise plan that is right for you. What should I know about screenings and tests to prevent falls? Screening and testing are the best ways to find a health problem early. Early diagnosis and treatment give you the best chance of managing medical conditions that are common after age 76. Certain conditions and lifestyle choices may make you more likely to have a fall. Your health care provider may recommend: Regular vision checks. Poor vision and conditions such as cataracts can make you more likely to have a fall. If you wear glasses, make sure to get your prescription updated if your vision changes. Medicine review. Work with your health care provider to regularly review all of the medicines you are taking, including over-the-counter medicines. Ask your health care provider about any side effects that may make you more likely to have a fall. Tell your health care provider if any medicines that you take make you feel dizzy or sleepy. Strength and balance checks. Your health care provider may recommend certain tests to check your strength and balance while standing, walking, or changing positions. Foot health exam. Foot pain and numbness, as well as not wearing proper footwear, can make you more likely to have a fall. Screenings, including: Osteoporosis screening. Osteoporosis is a condition that causes  the bones to get weaker and break more easily. Blood pressure screening. Blood pressure changes and medicines to control blood pressure can make you feel dizzy. Depression screening. You may be more likely to have a fall if you have a fear of falling, feel depressed, or feel unable to do activities that you used to do. Alcohol use screening. Using too much alcohol can affect your balance and may make you more likely to have a fall. Follow these instructions at home: Lifestyle Do not drink alcohol if: Your health care provider tells you not to drink. If you drink alcohol: Limit how much you have to: 0-1 drink a day for women. 0-2 drinks a day for men. Know how much alcohol is in your drink. In the [place] one drink equals one 12 oz bottle of beer (355 mL), one 5 oz glass of wine (148 mL), or one 1 oz glass of hard liquor (44 mL). Do not use any products that contain nicotine or tobacco. These products include cigarettes, chewing tobacco, and vaping devices, such as e-cigarettes. If you need help quitting, ask your health care provider. Activity  Follow a regular exercise program to stay fit. This will help you maintain your balance. Ask your health care provider what types of exercise are appropriate for you. If you need a cane or walker, use it as recommended by your health care provider. Wear supportive shoes that have nonskid soles. Safety  Remove any tripping hazards, such as rugs, cords, and clutter. Install safety equipment such as grab bars in bathrooms and safety rails on stairs. Keep rooms and walkways   well-lit. General instructions Talk with your health care provider about your risks for falling. Tell your health care provider if: You fall. Be sure to tell your health care provider about all falls, even ones that seem minor. You feel dizzy, tiredness (fatigue), or off-balance. Take over-the-counter and prescription medicines only as told by your health care provider. These include  supplements. Eat a healthy diet and maintain a healthy weight. A healthy diet includes low-fat dairy products, low-fat (lean) meats, and fiber from whole grains, beans, and lots of fruits and vegetables. Stay current with your vaccines. Schedule regular health, dental, and eye exams. Summary Having a healthy lifestyle and getting preventive care can help to protect your health and wellness after age 76. Screening and testing are the best way to find a health problem early and help you avoid having a fall. Early diagnosis and treatment give you the best chance for managing medical conditions that are more common for people who are older than age 76. Falls are a major cause of broken bones and head injuries in people who are older than age 76. Take precautions to prevent a fall at home. Work with your health care provider to learn what changes you can make to improve your health and wellness and to prevent falls. This information is not intended to replace advice given to you by your health care provider. Make sure you discuss any questions you have with your health care provider. Document Revised: 02/10/2021 Document Reviewed: 02/10/2021 Elsevier Patient Education  2023 Elsevier Inc.  

## 2022-02-10 NOTE — Assessment & Plan Note (Signed)
Currently active despite over-the-counter measures. ?Diet and nutrition discussed.  Advised to increase fiber intake. ?May benefit from Linzess 145 mg daily. ?

## 2022-02-11 ENCOUNTER — Encounter: Payer: Self-pay | Admitting: Emergency Medicine

## 2022-02-11 NOTE — Telephone Encounter (Signed)
No alternative.  Try going to the website to see if there is a special offer available for Linzess.

## 2022-02-16 ENCOUNTER — Ambulatory Visit
Admission: RE | Admit: 2022-02-16 | Discharge: 2022-02-16 | Disposition: A | Discharge status: Home / Self Care | Payer: Medicare Other | Source: Ambulatory Visit | Attending: Emergency Medicine | Admitting: Emergency Medicine

## 2022-02-16 DIAGNOSIS — Z87891 Personal history of nicotine dependence: Secondary | ICD-10-CM

## 2022-02-19 ENCOUNTER — Telehealth: Payer: Self-pay | Admitting: Acute Care

## 2022-02-19 DIAGNOSIS — Z87891 Personal history of nicotine dependence: Secondary | ICD-10-CM

## 2022-02-19 DIAGNOSIS — R911 Solitary pulmonary nodule: Secondary | ICD-10-CM

## 2022-02-19 NOTE — Telephone Encounter (Signed)
Called and spoke with pt and let him know that recent lung screening CT scan showed one tiny new nodule and that radiologist has recommended that we repeat the Ct scan in 6 months. Pt verbalized understanding and is aware that we will contact him close to the 6 mth time to schedule the CT scan.

## 2022-03-10 ENCOUNTER — Other Ambulatory Visit: Payer: Self-pay | Admitting: Emergency Medicine

## 2022-03-10 ENCOUNTER — Encounter: Payer: Self-pay | Admitting: Emergency Medicine

## 2022-03-10 ENCOUNTER — Other Ambulatory Visit: Payer: Self-pay | Admitting: *Deleted

## 2022-03-10 DIAGNOSIS — G4700 Insomnia, unspecified: Secondary | ICD-10-CM

## 2022-03-10 MED ORDER — ESZOPICLONE 3 MG PO TABS: 3.0000 mg | ORAL_TABLET | Freq: Every day | ORAL | 3 refills | Status: DC

## 2022-03-10 NOTE — Telephone Encounter (Signed)
Prescription sent successfully to pharmacy of record today.  Thanks.

## 2022-03-10 NOTE — Telephone Encounter (Signed)
Patient is requesting a refill of his Eszopiclone. I tried to send to pharmacy electronically but rx is only printing script. Please send . Thank you

## 2022-03-15 ENCOUNTER — Encounter: Payer: Self-pay | Admitting: Emergency Medicine

## 2022-03-18 ENCOUNTER — Telehealth: Payer: Self-pay

## 2022-03-18 NOTE — Telephone Encounter (Signed)
This nurse spoke with the patient regarding his billing concerns. Expressed having limited billing information but did advise patient that we have his primary and secondary insurance plans listed.Gave patient the number to our billing department and recommended that he contact them for more detailed information.

## 2022-03-30 ENCOUNTER — Encounter: Payer: Self-pay | Admitting: Emergency Medicine

## 2022-03-30 DIAGNOSIS — Z1211 Encounter for screening for malignant neoplasm of colon: Secondary | ICD-10-CM

## 2022-03-30 NOTE — Telephone Encounter (Signed)
Please advise ok for referral to GI for colon cancer screening

## 2022-03-31 DIAGNOSIS — H2513 Age-related nuclear cataract, bilateral: Secondary | ICD-10-CM | POA: Diagnosis not present

## 2022-04-06 ENCOUNTER — Encounter: Payer: Self-pay | Admitting: Internal Medicine

## 2022-05-10 ENCOUNTER — Other Ambulatory Visit: Payer: Self-pay | Admitting: Emergency Medicine

## 2022-05-10 DIAGNOSIS — R399 Unspecified symptoms and signs involving the genitourinary system: Secondary | ICD-10-CM

## 2022-05-11 ENCOUNTER — Ambulatory Visit (AMBULATORY_SURGERY_CENTER): Payer: Self-pay | Admitting: *Deleted

## 2022-05-11 VITALS — Ht 69.0 in | Wt 171.2 lb

## 2022-05-11 DIAGNOSIS — Z1211 Encounter for screening for malignant neoplasm of colon: Secondary | ICD-10-CM

## 2022-05-11 NOTE — Progress Notes (Signed)
No egg or soy allergy known to patient  No issues known to pt with past sedation with any surgeries or procedures Patient denies ever being told they had issues or difficulty with intubation  No FH of Malignant Hyperthermia Pt is not on diet pills Pt is not on  home 02  Pt is not on blood thinners  Pt has issues with constipation  No A fib or A flutter Have any cardiac testing pending--no Pt instructed to use Singlecare.com or GoodRx for a price reduction on prep    Instructed to take extra miralax for constipation prior to starting prep.sample sheet of over the counter items to purchase for prep given during pre-visit.

## 2022-05-15 ENCOUNTER — Ambulatory Visit (INDEPENDENT_AMBULATORY_CARE_PROVIDER_SITE_OTHER): Payer: Medicare Other

## 2022-05-15 DIAGNOSIS — Z0000 Encounter for general adult medical examination without abnormal findings: Secondary | ICD-10-CM | POA: Diagnosis not present

## 2022-05-15 NOTE — Progress Notes (Signed)
Subjective:   Kevin Shepard is a 76 y.o. male who presents for an Subsequent  Medicare Annual Wellness Visit.   I connected with Kevin Shepard  today by telephone and verified that I am speaking with the correct person using two identifiers. Location patient: home Location provider: work Persons participating in the virtual visit: patient, provider.   I discussed the limitations, risks, security and privacy concerns of performing an evaluation and management service by telephone and the availability of in person appointments. I also discussed with the patient that there may be a patient responsible charge related to this service. The patient expressed understanding and verbally consented to this telephonic visit.    Interactive audio and video telecommunications were attempted between this provider and patient, however failed, due to patient having technical difficulties OR patient did not have access to video capability.  We continued and completed visit with audio only.    Review of Systems     Cardiac Risk Factors include: advanced age (>11mn, >>36women);male gender     Objective:    Today's Vitals   There is no height or weight on file to calculate BMI.     05/15/2022   11:08 AM 02/01/2020   10:10 AM  Advanced Directives  Does Patient Have a Medical Advance Directive? Yes Yes  Type of AParamedicof [place]Living will [place] will  Does patient want to make changes to medical advance directive?  No - Patient declined  Copy of [place] Chart? No - copy requested No - copy requested    Current Medications (verified) Outpatient Encounter Medications as of 05/15/2022  Medication Sig   Cholecalciferol (VITAMIN D3 PO) Take by mouth daily. Take one pill daily   cyclobenzaprine (FLEXERIL) 10 MG tablet Take 1 tablet (10 mg total) by mouth at bedtime.   Eszopiclone 3 MG TABS Take 1 tablet (3 mg total) by mouth  at bedtime. Take immediately before bedtime as needed.   GARLIC PO Take by mouth daily. Take one tablet daily   Ginger, Zingiber officinalis, (GINGER PO) Take by mouth daily. Take one tablet daily   METAMUCIL FIBER PO Take by mouth as needed.   Multiple Vitamin (MULTIVITAMIN ADULT PO) Take by mouth daily. Take tablet daily   OVER THE COUNTER MEDICATION daily. Tumeric-Take one tablet daily   PREVIDENT 5000 BOOSTER PLUS 1.1 % PSTE SMARTSIG:Sparingly To Teeth   rosuvastatin (CRESTOR) 10 MG tablet TAKE 1 TABLET(10 MG) BY MOUTH DAILY   tamsulosin (FLOMAX) 0.4 MG CAPS capsule TAKE 1 CAPSULE(0.4 MG) BY MOUTH DAILY   acyclovir (ZOVIRAX) 200 MG capsule Take 1 capsule (200 mg total) by mouth 4 (four) times daily for 7 days. At time of flare-up.   linaclotide (LINZESS) 145 MCG CAPS capsule Take 1 capsule (145 mcg total) by mouth daily before breakfast. (Patient not taking: Reported on 05/11/2022)   No facility-administered encounter medications on file as of 05/15/2022.    Allergies (verified) Patient has no known allergies.   History: Past Medical History:  Diagnosis Date   Cataract    beginning   COPD (chronic obstructive pulmonary disease) ([place]    Hyperlipidemia    Past Surgical History:  Procedure Laterality Date   APPENDECTOMY     per patient 120's  FRACTURE SURGERY  1974   right clavicle   LUMBAR SPINE SURGERY  2018   SPINE SURGERY  10/2016   TONSILLECTOMY     Family History  Problem  Relation Age of Onset   Cancer Mother        Breast   Early death Father    Heart disease Father 37       rheumatic heart disease   Hyperlipidemia Sister    Colon cancer Neg Hx    Colon polyps Neg Hx    Crohn's disease Neg Hx    Esophageal cancer Neg Hx    Rectal cancer Neg Hx    Stomach cancer Neg Hx    Ulcerative colitis Neg Hx    Social History   Socioeconomic History   Marital status: Married    Spouse name: Not on file   Number of children: Not on file   Years of education: Not  on file   Highest education level: Not on file  Occupational History   Occupation: stock market trading  Tobacco Use   Smoking status: Former    Packs/day: 1.00    Years: 40.00    Total pack years: 40.00    Types: Cigarettes    Quit date: 10/04/2017    Years since quitting: 4.6    Passive exposure: Past   Smokeless tobacco: Never  Vaping Use   Vaping Use: Never used  Substance and Sexual Activity   Alcohol use: Yes    Comment: daily,wine   Drug use: Never   Sexual activity: Yes    Partners: Female  Other Topics Concern   Not on file  Social History Narrative   Not on file   Social Determinants of Health   Financial Resource Strain: Low Risk  (05/15/2022)   Overall Financial Resource Strain (CARDIA)    Difficulty of Paying Living Expenses: Not hard at all  Food Insecurity: No Food Insecurity (05/15/2022)   Hunger Vital Sign    Worried About Running Out of Food in the Last Year: Never true    Ran Out of Food in the Last Year: Never true  Transportation Needs: No Transportation Needs (05/15/2022)   PRAPARE - Hydrologist (Medical): No    Lack of Transportation (Non-Medical): No  Physical Activity: Sufficiently Active (05/15/2022)   Exercise Vital Sign    Days of Exercise per Week: 5 days    Minutes of Exercise per Session: 40 min  Stress: No Stress Concern Present (05/15/2022)   [place]    Feeling of Stress : Not at all  Social Connections: Moderately Isolated (05/15/2022)   Social Connection and Isolation Panel [NHANES]    Frequency of Communication with Friends and Family: Three times a week    Frequency of Social Gatherings with Friends and Family: Three times a week    Attends Religious Services: Never    Active Member of Clubs or Organizations: No    Attends Music therapist: Never    Marital Status: Married    Tobacco Counseling Counseling given: Not  Answered   Clinical Intake:  Pre-visit preparation completed: Yes  Pain : No/denies pain     Nutritional Risks: None Diabetes: No  How often do you need to have someone help you when you read instructions, pamphlets, or other written materials from your doctor or pharmacy?: 1 - Never What is the last grade level you completed in school?: college  Diabetic?no   Interpreter Needed?: No  Information entered by :: l.Wilson,LPn   Activities of Daily Living    05/15/2022   11:10 AM  In your present state of health, do  you have any difficulty performing the following activities:  Hearing? 0  Vision? 0  Difficulty concentrating or making decisions? 0  Walking or climbing stairs? 0  Dressing or bathing? 0  Doing errands, shopping? 0  Preparing Food and eating ? N  Using the Toilet? N  In the past six months, have you accidently leaked urine? N  Do you have problems with loss of bowel control? N  Managing your Medications? N  Managing your Finances? N  Housekeeping or managing your Housekeeping? N    Patient Care Team: Horald Pollen, MD as PCP - General (Internal Medicine)  Indicate any recent Medical Services you may have received from other than [facility] providers in the past year (date may be approximate).     Assessment:   This is a routine wellness examination for [place].  Hearing/Vision screen Vision Screening - Comments:: Annual eye exams wear glasses   Dietary issues and exercise activities discussed: Current Exercise Habits: Home exercise routine, Type of exercise: walking, Time (Minutes): 40, Frequency (Times/Week): 5, Weekly Exercise (Minutes/Week): 200, Intensity: Mild, Exercise limited by: None identified   Goals Addressed   None    Depression Screen    05/15/2022   11:10 AM 05/15/2022   11:08 AM 05/15/2022   11:07 AM 02/10/2022    9:10 AM 08/13/2021    9:58 AM 01/29/2021    8:42 AM 07/31/2020    8:27 AM  PHQ 2/9 Scores  PHQ - 2 Score 0 0 0 0 0  0 0    Fall Risk    05/15/2022   11:08 AM 02/10/2022    9:10 AM 08/13/2021    9:57 AM 01/29/2021    8:42 AM 07/31/2020    8:26 AM  Fall Risk   Falls in the past year? 0 0 0 0 0  Number falls in past yr: 0  0 0   Injury with Fall? 0  0    Risk for fall due to :    No Fall Risks   Follow up Falls evaluation completed;Education provided   Falls evaluation completed Falls evaluation completed    [place]:  Any stairs in or around the home? No  If so, are there any without handrails? No  Home free of loose throw rugs in walkways, pet beds, electrical cords, etc? Yes  Adequate lighting in your home to reduce risk of falls? Yes   ASSISTIVE DEVICES UTILIZED TO PREVENT FALLS:  Life alert? No  Use of a cane, walker or w/c? No  Grab bars in the bathroom? No  Shower chair or bench in shower? No  Elevated toilet seat or a handicapped toilet? No     Cognitive Function:  Normal cognitive status assessed by telephone conversation by this Nurse Health Advisor. No abnormalities found.        05/15/2022   11:11 AM 02/01/2020   10:10 AM  6CIT Screen  What Year? 0 points 0 points  What month? 0 points 0 points  What time? 0 points 0 points  Count back from 20 0 points 0 points  Months in reverse 0 points 0 points  Repeat phrase 0 points 0 points  Total Score 0 points 0 points    Immunizations Immunization History  Administered Date(s) Administered   Fluad Quad(high Dose 65+) 07/25/2020   Influenza, High Dose Seasonal PF 06/07/2018, 07/03/2019   Influenza-Unspecified 07/05/2017, 07/13/2021   PFIZER(Purple Top)SARS-COV-2 Vaccination 11/14/2019, 12/10/2019, 08/02/2020   Pneumococcal Conjugate-13 06/27/2014  Pneumococcal Polysaccharide-23 02/05/2006, 05/15/2013   Tdap 08/16/2007     TDAP status: Due, Education has been provided regarding the importance of this vaccine. Advised may receive this vaccine at local pharmacy or Health Dept. Aware to  provide a copy of the vaccination record if obtained from local pharmacy or Health Dept. Verbalized acceptance and understanding.  Flu Vaccine status: Up to date  Pneumococcal vaccine status: Up to date  Covid-19 vaccine status: Completed vaccines  Qualifies for Shingles Vaccine? Yes   Zostavax completed No   Shingrix Completed?: No.    Education has been provided regarding the importance of this vaccine. Patient has been advised to call insurance company to determine out of pocket expense if they have not yet received this vaccine. Advised may also receive vaccine at local pharmacy or Health Dept. Verbalized acceptance and understanding.  Screening Tests Health Maintenance  Topic Date Due   Hepatitis C Screening  Never done   Zoster Vaccines- Shingrix (1 of 2) Never done   TETANUS/TDAP  08/15/2017   COVID-19 Vaccine (4 - Pfizer risk series) 09/27/2020   COLONOSCOPY (Pts 45-45yr Insurance coverage will need to be confirmed)  02/19/2022   INFLUENZA VACCINE  05/05/2022   Pneumonia Vaccine 76 Years old  Completed   HPV VACCINES  Aged Out    Health Maintenance  Health Maintenance Due  Topic Date Due   Hepatitis C Screening  Never done   Zoster Vaccines- Shingrix (1 of 2) Never done   TETANUS/TDAP  08/15/2017   COVID-19 Vaccine (4 - Pfizer risk series) 09/27/2020   COLONOSCOPY (Pts 45-482yrInsurance coverage will need to be confirmed)  02/19/2022   INFLUENZA VACCINE  05/05/2022    Colorectal cancer screening: No longer required.   Lung Cancer Screening: (Low Dose CT Chest recommended if Age 76-80ears, 30 pack-year currently smoking OR have quit w/in 15years.) does not qualify.   Lung Cancer Screening Referral: n/a  Additional Screening:  Hepatitis C Screening: does not qualify;   Vision Screening: Recommended annual ophthalmology exams for early detection of glaucoma and other disorders of the eye. Is the patient up to date with their annual eye exam?  Yes  Who is the  provider or what is the name of the office in which the patient attends annual eye exams? [facility] is not established with a provider, would they like to be referred to a provider to establish care? No .   Dental Screening: Recommended annual dental exams for proper oral hygiene  Community Resource Referral / Chronic Care Management: CRR required this visit?  No   CCM required this visit?  No      Plan:     I have personally reviewed and noted the following in the patient's chart:   Medical and social history Use of alcohol, tobacco or illicit drugs  Current medications and supplements including opioid prescriptions. Patient is not currently taking opioid prescriptions. Functional ability and status Nutritional status Physical activity Advanced directives List of other physicians Hospitalizations, surgeries, and ER visits in previous 12 months Vitals Screenings to include cognitive, depression, and falls Referrals and appointments  In addition, I have reviewed and discussed with patient certain preventive protocols, quality metrics, and best practice recommendations. A written personalized care plan for preventive services as well as general preventive health recommendations were provided to patient.     LaDaphane ShepherdLPN   [id number] Nurse Notes: none

## 2022-05-15 NOTE — Patient Instructions (Signed)
Kevin Shepard , Thank you for taking time to come for your Medicare Wellness Visit. I appreciate your ongoing commitment to your health goals. Please review the following plan we discussed and let me know if I can assist you in the future.   Screening recommendations/referrals: Colonoscopy: no longer required  Recommended yearly ophthalmology/optometry visit for glaucoma screening and checkup Recommended yearly dental visit for hygiene and checkup  Vaccinations: Influenza vaccine: completed  Pneumococcal vaccine: completed  Tdap vaccine: due  Shingles vaccine: will consider     Advanced directives: yes   Conditions/risks identified: none   Next appointment: none   Preventive Care 76 Years and Older, Male Preventive care refers to lifestyle choices and visits with your health care provider that can promote health and wellness. What does preventive care include? A yearly physical exam. This is also called an annual well check. Dental exams once or twice a year. Routine eye exams. Ask your health care provider how often you should have your eyes checked. Personal lifestyle choices, including: Daily care of your teeth and gums. Regular physical activity. Eating a healthy diet. Avoiding tobacco and drug use. Limiting alcohol use. Practicing safe sex. Taking low doses of aspirin every day. Taking vitamin and mineral supplements as recommended by your health care provider. What happens during an annual well check? The services and screenings done by your health care provider during your annual well check will depend on your age, overall health, lifestyle risk factors, and family history of disease. Counseling  Your health care provider may ask you questions about your: Alcohol use. Tobacco use. Drug use. Emotional well-being. Home and relationship well-being. Sexual activity. Eating habits. History of falls. Memory and ability to understand (cognition). Work and work  Statistician. Screening  You may have the following tests or measurements: Height, weight, and BMI. Blood pressure. Lipid and cholesterol levels. These may be checked every 5 years, or more frequently if you are over 40 years old. Skin check. Lung cancer screening. You may have this screening every year starting at age 49 if you have a 30-pack-year history of smoking and currently smoke or have quit within the past 15 years. Fecal occult blood test (FOBT) of the stool. You may have this test every year starting at age 62. Flexible sigmoidoscopy or colonoscopy. You may have a sigmoidoscopy every 5 years or a colonoscopy every 10 years starting at age 74. Prostate cancer screening. Recommendations will vary depending on your family history and other risks. Hepatitis C blood test. Hepatitis B blood test. Sexually transmitted disease (STD) testing. Diabetes screening. This is done by checking your blood sugar (glucose) after you have not eaten for a while (fasting). You may have this done every 1-3 years. Abdominal aortic aneurysm (AAA) screening. You may need this if you are a current or former smoker. Osteoporosis. You may be screened starting at age 45 if you are at high risk. Talk with your health care provider about your test results, treatment options, and if necessary, the need for more tests. Vaccines  Your health care provider may recommend certain vaccines, such as: Influenza vaccine. This is recommended every year. Tetanus, diphtheria, and acellular pertussis (Tdap, Td) vaccine. You may need a Td booster every 10 years. Zoster vaccine. You may need this after age 30. Pneumococcal 13-valent conjugate (PCV13) vaccine. One dose is recommended after age 40. Pneumococcal polysaccharide (PPSV23) vaccine. One dose is recommended after age 8. Talk to your health care provider about which screenings and vaccines you need  and how often you need them. This information is not intended to replace  advice given to you by your health care provider. Make sure you discuss any questions you have with your health care provider. Document Released: 10/18/2015 Document Revised: 06/10/2016 Document Reviewed: 07/23/2015 Elsevier Interactive Patient Education  2017 [place] Prevention in the Home Falls can cause injuries. They can happen to people of all ages. There are many things you can do to make your home safe and to help prevent falls. What can I do on the outside of my home? Regularly fix the edges of walkways and driveways and fix any cracks. Remove anything that might make you trip as you walk through a door, such as a raised step or threshold. Trim any bushes or trees on the path to your home. Use bright outdoor lighting. Clear any walking paths of anything that might make someone trip, such as rocks or tools. Regularly check to see if handrails are loose or broken. Make sure that both sides of any steps have handrails. Any raised decks and porches should have guardrails on the edges. Have any leaves, snow, or ice cleared regularly. Use sand or salt on walking paths during winter. Clean up any spills in your garage right away. This includes oil or grease spills. What can I do in the bathroom? Use night lights. Install grab bars by the toilet and in the tub and shower. Do not use towel bars as grab bars. Use non-skid mats or decals in the tub or shower. If you need to sit down in the shower, use a plastic, non-slip stool. Keep the floor dry. Clean up any water that spills on the floor as soon as it happens. Remove soap buildup in the tub or shower regularly. Attach bath mats securely with double-sided non-slip rug tape. Do not have throw rugs and other things on the floor that can make you trip. What can I do in the bedroom? Use night lights. Make sure that you have a light by your bed that is easy to reach. Do not use any sheets or blankets that are too big for your bed.  They should not hang down onto the floor. Have a firm chair that has side arms. You can use this for support while you get dressed. Do not have throw rugs and other things on the floor that can make you trip. What can I do in the kitchen? Clean up any spills right away. Avoid walking on wet floors. Keep items that you use a lot in easy-to-reach places. If you need to reach something above you, use a strong step stool that has a grab bar. Keep electrical cords out of the way. Do not use floor polish or wax that makes floors slippery. If you must use wax, use non-skid floor wax. Do not have throw rugs and other things on the floor that can make you trip. What can I do with my stairs? Do not leave any items on the stairs. Make sure that there are handrails on both sides of the stairs and use them. Fix handrails that are broken or loose. Make sure that handrails are as long as the stairways. Check any carpeting to make sure that it is firmly attached to the stairs. Fix any carpet that is loose or worn. Avoid having throw rugs at the top or bottom of the stairs. If you do have throw rugs, attach them to the floor with carpet tape. Make sure that you  have a light switch at the top of the stairs and the bottom of the stairs. If you do not have them, ask someone to add them for you. What else can I do to help prevent falls? Wear shoes that: Do not have high heels. Have rubber bottoms. Are comfortable and fit you well. Are closed at the toe. Do not wear sandals. If you use a stepladder: Make sure that it is fully opened. Do not climb a closed stepladder. Make sure that both sides of the stepladder are locked into place. Ask someone to hold it for you, if possible. Clearly mark and make sure that you can see: Any grab bars or handrails. First and last steps. Where the edge of each step is. Use tools that help you move around (mobility aids) if they are needed. These  include: Canes. Walkers. Scooters. Crutches. Turn on the lights when you go into a dark area. Replace any light bulbs as soon as they burn out. Set up your furniture so you have a clear path. Avoid moving your furniture around. If any of your floors are uneven, fix them. If there are any pets around you, be aware of where they are. Review your medicines with your doctor. Some medicines can make you feel dizzy. This can increase your chance of falling. Ask your doctor what other things that you can do to help prevent falls. This information is not intended to replace advice given to you by your health care provider. Make sure you discuss any questions you have with your health care provider. Document Released: 07/18/2009 Document Revised: 02/27/2016 Document Reviewed: 10/26/2014 Elsevier Interactive Patient Education  2017 [employer].

## 2022-05-19 ENCOUNTER — Encounter: Payer: Self-pay | Admitting: Emergency Medicine

## 2022-05-19 ENCOUNTER — Telehealth: Payer: Self-pay | Admitting: Internal Medicine

## 2022-05-19 ENCOUNTER — Encounter: Payer: Self-pay | Admitting: Internal Medicine

## 2022-05-19 DIAGNOSIS — Z8719 Personal history of other diseases of the digestive system: Secondary | ICD-10-CM

## 2022-05-19 NOTE — Telephone Encounter (Signed)
Thank you for this information.  I saw from [place] message to primary care that he had a for an 8 mm polyp removed from the rectum and hepatic flexure on 02/19/2017.  The pathology report is not available.  This was performed at [facility] in [place].   This is helpful but it would be best for him to get ROI for pathology results as well.  I checked care everywhere and that hospital is not available through that portal.

## 2022-05-19 NOTE — Telephone Encounter (Signed)
Spoke to pt. Concerning question, when asked pt. What type of polyps were removed he stated "I don't know". Explained to him that we entered information on referral from pcp and it was for screening colonoscopy,pt. Wanted a chance to obtain results of last procedure and requested that procedure on 05/26/22 be cancelled and once he has results he will contact pcp to resubmit referral. Procedure cancelled per request.

## 2022-05-19 NOTE — Telephone Encounter (Signed)
Noted  

## 2022-05-19 NOTE — Telephone Encounter (Signed)
patient walked into the office wanting to know why his instructions were "check marked" as preventative colonoscopy? He says that his last colon was 5 years ago in [place] and he has a hx of polyps.

## 2022-05-25 ENCOUNTER — Encounter: Payer: Self-pay | Admitting: *Deleted

## 2022-05-26 ENCOUNTER — Encounter: Payer: Medicare Other | Admitting: Internal Medicine

## 2022-07-03 ENCOUNTER — Encounter: Payer: Self-pay | Admitting: Emergency Medicine

## 2022-07-03 DIAGNOSIS — Z1211 Encounter for screening for malignant neoplasm of colon: Secondary | ICD-10-CM

## 2022-07-10 ENCOUNTER — Telehealth: Payer: Self-pay | Admitting: Internal Medicine

## 2022-07-10 NOTE — Telephone Encounter (Signed)
Hi Dr. Carlean Purl,   Patient called to schedule a colonoscopy, also received a referral from PCP. Would it ok with you to proceed with scheduling for him because I see in Epic you had previously requested pathology report from his lat colonoscopy not sure if they were received and reviewed by you.  Please advise. Thanks

## 2022-07-12 NOTE — Telephone Encounter (Signed)
I have not seen the pathology report - Kristen requested it. We were trying to be precise about things but this may be the best we can do so I am ok if we schedule a repeat colonoscopy

## 2022-07-15 ENCOUNTER — Encounter: Payer: Self-pay | Admitting: Emergency Medicine

## 2022-07-15 DIAGNOSIS — Z1211 Encounter for screening for malignant neoplasm of colon: Secondary | ICD-10-CM

## 2022-07-20 NOTE — Telephone Encounter (Signed)
Scheduled colonoscopy for 09-01-22 at 10:30, PV scheduled for 08-18-22 at 8:30 am

## 2022-07-24 ENCOUNTER — Telehealth: Payer: Self-pay | Admitting: Internal Medicine

## 2022-07-24 NOTE — Telephone Encounter (Signed)
I received a letter from the patient regarding problems we have had obtaining pathology reports for his colonoscopy.  He had received information from primary care that the polyp Trelegy report from his colonoscopy 5 years ago in [place] had arrived but I cannot see it in our system anywhere.  It is possible we did lose it or that it has not yet been scanned in.  We had originally deferred his colonoscopy until I reviewed the pathology to be certain he needed 1.  However after we could not find the pathology report question if it actually came from [place], I decided that we should go ahead with the colonoscopy based upon the information we had.  I explained my rationale to Kevin Shepard today and a phone call and he seems satisfied with that explanation.  I will double check again to see if the pathology report had turned up.

## 2022-08-02 ENCOUNTER — Ambulatory Visit (INDEPENDENT_AMBULATORY_CARE_PROVIDER_SITE_OTHER): Payer: Medicare Other

## 2022-08-02 ENCOUNTER — Ambulatory Visit
Admission: EM | Admit: 2022-08-02 | Discharge: 2022-08-02 | Disposition: A | Discharge status: Home / Self Care | Payer: Medicare Other | Attending: Urgent Care | Admitting: Urgent Care

## 2022-08-02 DIAGNOSIS — R059 Cough, unspecified: Secondary | ICD-10-CM | POA: Diagnosis not present

## 2022-08-02 DIAGNOSIS — J209 Acute bronchitis, unspecified: Secondary | ICD-10-CM

## 2022-08-02 DIAGNOSIS — J449 Chronic obstructive pulmonary disease, unspecified: Secondary | ICD-10-CM | POA: Diagnosis not present

## 2022-08-02 MED ORDER — PREDNISONE 50 MG PO TABS: 50.0000 mg | ORAL_TABLET | Freq: Every day | ORAL | 0 refills | Status: DC

## 2022-08-02 MED ORDER — PROMETHAZINE-DM 6.25-15 MG/5ML PO SYRP: 2.5000 mL | ORAL_SOLUTION | Freq: Three times a day (TID) | ORAL | 0 refills | Status: DC | PRN

## 2022-08-02 NOTE — ED Provider Notes (Signed)
[facility]  Note:  This document was prepared using Systems analyst and may include unintentional dictation errors.  MRN: [id number] DOB: 1946/05/02  Subjective:   Kevin Shepard is a 76 y.o. male presenting for 2-week history of persistent coughing, chest congestion, wheezing, malaise and fatigue.  Has a history of COPD II.  He is no longer smoking.  Does not need refills on his inhalers.  Does not use oxygen at home.  No overt fever, chest pain.  No current facility-administered medications for this encounter.  Current Outpatient Medications:    acyclovir (ZOVIRAX) 200 MG capsule, Take 1 capsule (200 mg total) by mouth 4 (four) times daily for 7 days. At time of flare-up., Disp: 28 capsule, Rfl: 5   Cholecalciferol (VITAMIN D3 PO), Take by mouth daily. Take one pill daily, Disp: , Rfl:    cyclobenzaprine (FLEXERIL) 10 MG tablet, Take 1 tablet (10 mg total) by mouth at bedtime., Disp: 30 tablet, Rfl: 1   Eszopiclone 3 MG TABS, Take 1 tablet (3 mg total) by mouth at bedtime. Take immediately before bedtime as needed., Disp: 30 tablet, Rfl: 3   GARLIC PO, Take by mouth daily. Take one tablet daily, Disp: , Rfl:    Ginger, Zingiber officinalis, (GINGER PO), Take by mouth daily. Take one tablet daily, Disp: , Rfl:    linaclotide (LINZESS) 145 MCG CAPS capsule, Take 1 capsule (145 mcg total) by mouth daily before breakfast. (Patient not taking: Reported on 05/11/2022), Disp: 90 capsule, Rfl: 1   METAMUCIL FIBER PO, Take by mouth as needed., Disp: , Rfl:    Multiple Vitamin (MULTIVITAMIN ADULT PO), Take by mouth daily. Take tablet daily, Disp: , Rfl:    OVER THE COUNTER MEDICATION, daily. Tumeric-Take one tablet daily, Disp: , Rfl:    PREVIDENT 5000 BOOSTER PLUS 1.1 % PSTE, SMARTSIG:Sparingly To Teeth, Disp: , Rfl:    rosuvastatin (CRESTOR) 10 MG tablet, TAKE 1 TABLET(10 MG) BY MOUTH DAILY, Disp: 90 tablet, Rfl: 3   tamsulosin (FLOMAX) 0.4 MG CAPS capsule,  TAKE 1 CAPSULE(0.4 MG) BY MOUTH DAILY, Disp: 90 capsule, Rfl: 1   No Known Allergies  Past Medical History:  Diagnosis Date   Cataract    beginning   Colon polyps    COPD (chronic obstructive pulmonary disease) (HCC)    Hyperlipidemia      Past Surgical History:  Procedure Laterality Date   APPENDECTOMY     per patient 7's   COLONOSCOPY     Last 02/19/2017 [facility] an 8 mm polyps rectum and hepatic flexure with history of polyps prior   FRACTURE SURGERY  1974   right clavicle   LUMBAR SPINE SURGERY  2018   SPINE SURGERY  10/2016   TONSILLECTOMY      Family History  Problem Relation Age of Onset   Cancer Mother        Breast   Early death Father    Heart disease Father 52       rheumatic heart disease   Hyperlipidemia Sister    Colon cancer Neg Hx    Colon polyps Neg Hx    Crohn's disease Neg Hx    Esophageal cancer Neg Hx    Rectal cancer Neg Hx    Stomach cancer Neg Hx    Ulcerative colitis Neg Hx     Social History   Tobacco Use   Smoking status: Former    Packs/day: 1.00    Years: 40.00  Total pack years: 40.00    Types: Cigarettes    Quit date: 10/04/2017    Years since quitting: 4.8    Passive exposure: Past   Smokeless tobacco: Never  Vaping Use   Vaping Use: Never used  Substance Use Topics   Alcohol use: Yes    Comment: daily,wine   Drug use: Never    ROS   Objective:   Vitals: BP 117/73 (BP Location: Right Arm)   Pulse 63   Temp 97.9 F (36.6 C) (Oral)   Resp 18   Ht '5\' 9"'$  (1.753 m)   Wt 160 lb (72.6 kg)   SpO2 93%   BMI 23.63 kg/m   Physical Exam Constitutional:      General: He is not in acute distress.    Appearance: Normal appearance. He is well-developed. He is not ill-appearing, toxic-appearing or diaphoretic.  HENT:     Head: Normocephalic and atraumatic.     Right Ear: External ear normal.     Left Ear: External ear normal.     Nose: Nose normal.     Mouth/Throat:     Mouth: Mucous  membranes are moist.  Eyes:     General: No scleral icterus.       Right eye: No discharge.        Left eye: No discharge.     Extraocular Movements: Extraocular movements intact.  Cardiovascular:     Rate and Rhythm: Normal rate and regular rhythm.     Heart sounds: Normal heart sounds. No murmur heard.    No friction rub. No gallop.  Pulmonary:     Effort: Pulmonary effort is normal. No respiratory distress.     Breath sounds: No stridor. Rhonchi (coarse and rhonchorous throughout worse over mid fields) present. No wheezing or rales.  Neurological:     Mental Status: He is alert and oriented to person, place, and time.  Psychiatric:        Mood and Affect: Mood normal.        Behavior: Behavior normal.        Thought Content: Thought content normal.    DG Chest 2 View  Result Date: 08/02/2022 CLINICAL DATA:  Two week history of persistent cough. EXAM: CHEST - 2 VIEW COMPARISON:  Radiograph 07/17/2019, CT 02/16/2022 FINDINGS: Heart is normal in size. Stable mediastinal contours. Streaky opacities at the left lung base typical of atelectasis or scarring, also seen on prior exam. No acute or focal airspace disease. No pulmonary edema, pleural effusion or pneumothorax. Mild thoracic spondylosis with spurring, no acute osseous findings. IMPRESSION: 1. No acute findings. 2. Streaky opacities at the left lung base typical of atelectasis or scarring. Electronically Signed   By: Keith Rake M.D.   On: 08/02/2022 10:58    Assessment and Plan :   PDMP not reviewed this encounter.  1. Acute bronchitis, unspecified organism   2. Chronic obstructive pulmonary disease, unspecified COPD type ([place])     Chest x-ray negative for an acute pneumonia.  Recommended oral prednisone course.  Use supportive care otherwise.  Maintain all other medications.  Deferred COVID testing given timeline of illness. Counseled patient on potential for adverse effects with medications prescribed/recommended today,  ER and return-to-clinic precautions discussed, patient verbalized understanding.    Jaynee Eagles, PA-C 08/02/22 1112

## 2022-08-02 NOTE — ED Triage Notes (Signed)
Pt states that he has a cough, chest congestion and wheezing. X2 weeks

## 2022-08-13 ENCOUNTER — Ambulatory Visit
Admission: RE | Admit: 2022-08-13 | Discharge: 2022-08-13 | Disposition: A | Discharge status: Home / Self Care | Payer: Medicare Other | Source: Ambulatory Visit | Attending: Emergency Medicine | Admitting: Emergency Medicine

## 2022-08-13 VITALS — BP 138/74 | HR 58 | Temp 98.3°F | Resp 16

## 2022-08-13 DIAGNOSIS — J440 Chronic obstructive pulmonary disease with acute lower respiratory infection: Secondary | ICD-10-CM

## 2022-08-13 DIAGNOSIS — R062 Wheezing: Secondary | ICD-10-CM

## 2022-08-13 DIAGNOSIS — J209 Acute bronchitis, unspecified: Secondary | ICD-10-CM | POA: Diagnosis not present

## 2022-08-13 MED ORDER — FLUTICASONE FUROATE-VILANTEROL 200-25 MCG/ACT IN AEPB: 1.0000 | INHALATION_SPRAY | Freq: Every day | RESPIRATORY_TRACT | 5 refills | Status: DC

## 2022-08-13 MED ORDER — ALBUTEROL SULFATE HFA 108 (90 BASE) MCG/ACT IN AERS: 2.0000 | INHALATION_SPRAY | Freq: Four times a day (QID) | RESPIRATORY_TRACT | 0 refills | Status: DC | PRN

## 2022-08-13 MED ORDER — ALBUTEROL SULFATE (2.5 MG/3ML) 0.083% IN NEBU
2.5000 mg | INHALATION_SOLUTION | Freq: Once | RESPIRATORY_TRACT | Status: AC
  Administered 2022-08-13: 2.5 mg via RESPIRATORY_TRACT

## 2022-08-13 MED ORDER — TETANUS-DIPHTH-ACELL PERTUSSIS 5-2.5-18.5 LF-MCG/0.5 IM SUSY: 0.5000 mL | PREFILLED_SYRINGE | Freq: Once | INTRAMUSCULAR | Status: DC

## 2022-08-13 MED ORDER — METHYLPREDNISOLONE 4 MG PO TBPK: ORAL_TABLET | ORAL | 0 refills | Status: DC

## 2022-08-13 MED ORDER — METHYLPREDNISOLONE SODIUM SUCC 125 MG IJ SOLR
125.0000 mg | Freq: Once | INTRAMUSCULAR | Status: AC
  Administered 2022-08-13: 125 mg via INTRAMUSCULAR

## 2022-08-13 NOTE — ED Triage Notes (Signed)
Pt c/o cough, congestion, wheezing-sx started ~2.5 weeks ago-seen here for c/o-states he feels no better-NAD-steady gait

## 2022-08-13 NOTE — Discharge Instructions (Signed)
Please read below to learn more about the medications, dosages and frequencies that I recommend to help alleviate your symptoms and to get you feeling better soon:   Solu-Medrol IM (methylprednisolone):  To quickly address your significant respiratory inflammation, you were provided with an injection of Solu-Medrol in the office today.  You should continue to feel the full benefit of the steroid for the next 4 to 6 hours.    Medrol Dosepak (methylprednisolone): This is a steroid that will significantly calm your upper and lower airways, please take one row of tablets daily with your breakfast meal starting tomorrow morning until the prescription is complete.      ProAir, Ventolin, Proventil (albuterol): This inhaled medication contains a short acting beta agonist bronchodilator.  This medication works on the smooth muscle that opens and constricts of your airways by relaxing the muscle.  The result of relaxation of the smooth muscle is increased air movement and improved work of breathing.  This is a short acting medication that can be used every 4-6 hours as needed for increased work of breathing, shortness of breath, wheezing and excessive coughing.  I have provided you with a prescription.    Breo (fluticasone and vilanterol): Please inhale 1 puff every day in the morning.  This inhaled medication contains a corticosteroid and long-acting form of albuterol.  The inhaled steroid and this medication  is not absorbed into the body and will not cause side effects such as increased blood sugar levels, irritability, sleeplessness or weight gain.  Inhaled corticosteroid are sort of like topical steroid creams but, as you can imagine, it is not practical to attempt to rub a steroid cream inside of your lungs.  The long-acting albuterol works similarly to the short acting albuterol found in your rescue inhaler but provides 24-hour relaxation of the smooth muscles that open and constrict your airways; your short  acting rescue inhaler can only provide for a few hours this benefit for a few hours.  Always rinse your mouth after using to remove any of all steroid powder.  Please feel free to continue using your short acting rescue inhaler as often as needed throughout the day for shortness of breath, wheezing, and cough.     Promethazine DM: Promethazine is both a nasal decongestant and an antinausea medication that makes most patients feel fairly sleepy.  The DM is dextromethorphan, a cough suppressant found in many over-the-counter cough medications.  Please take 5 mL before bedtime to minimize your cough which will help you sleep better.  I have sent a prescription for this medication to your pharmacy.   Please follow-up within the next 5-7 days either with your primary care provider or urgent care if your symptoms do not resolve.  If you do not have a primary care provider, we will assist you in finding one.        Thank you for visiting urgent care today.  We appreciate the opportunity to participate in your care.

## 2022-08-13 NOTE — ED Provider Notes (Signed)
[facility]    CSN: [id number] Arrival date & time: 08/13/22  5329    HISTORY   Chief Complaint  Patient presents with   Wheezing    Follow up to visit two weeks ago. Still have congestion, wheezing and difficulty breathing. - Entered by patient   HPI Kevin Shepard is a pleasant, 76 y.o. male who presents to urgent care today. Patient complains of continued cough and congestion, wheezing.  States his symptoms began 2 and half weeks ago.  Patient states he was seen here at urgent care on October 29th 2023 for the symptoms.  EMR reviewed, patient was provided with prednisone and Promethazine DM.  Patient states that he had improved work of breathing after taking the prednisone but got no better, states Promethazine DM "knocked me out" at night and he can sleep patient's oxygen saturations have improved since that visit.  Patient's vital signs are normal today.  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  Patient states that he not advised to rinse his mouth and throat after using Breo.  Patient states he used Breo once a few days ago and also took Mucinex once a few days ago and did not feel that either were helpful.  Patient denies history of allergies and asthma but endorses frequent episodes of bronchitis times annually.  The history is provided by the patient.   Past Medical History:  Diagnosis Date   Cataract    beginning   Colon polyps    COPD (chronic obstructive pulmonary disease) ([place])    Hyperlipidemia    Patient Active Problem List   Diagnosis Date Noted   Chronic constipation 02/10/2022   Lower urinary tract symptoms 02/10/2022   Musculoskeletal neck pain 08/13/2021   History of herpes simplex infection 08/13/2021   Dyslipidemia 01/29/2021   Atherosclerosis of aorta ([place]) 01/29/2021   Insomnia 01/29/2021   Atherosclerosis of  native coronary artery of native heart without angina pectoris 01/29/2021   Seborrheic keratoses 04/11/2020   Actinic keratoses 04/11/2020   COPD  GOLD II/ Group A 06/02/2018   Left upper lobe pulmonary nodule 06/02/2018   Former smoker 04/06/2018   Alcohol use 01/10/2018   Chronic bilateral low back pain without sciatica 01/10/2018   History of tobacco use 01/10/2018   History of back surgery 01/10/2018   Past Surgical History:  Procedure Laterality Date   APPENDECTOMY     per patient 75's   COLONOSCOPY     Last 02/19/2017 [facility] an 8 mm polyps rectum and hepatic flexure with history of polyps prior   FRACTURE SURGERY  1974   right clavicle   LUMBAR SPINE SURGERY  2018   SPINE SURGERY  10/2016   TONSILLECTOMY      Home Medications    Prior to Admission medications   Medication Sig Start Date End Date Taking? Authorizing Provider  acyclovir (ZOVIRAX) 200 MG capsule Take 1 capsule (200 mg total) by mouth 4 (four) times daily for 7 days. At time of flare-up. 08/13/21 08/20/21  Horald Pollen, MD  Cholecalciferol (VITAMIN D3 PO) Take by mouth daily. Take one pill daily    [provider]  cyclobenzaprine (FLEXERIL) 10 MG tablet Take 1 tablet (10 mg total) by mouth at bedtime. 08/13/21   Horald Pollen, MD  Eszopiclone 3 MG TABS Take 1 tablet (3 mg  total) by mouth at bedtime. Take immediately before bedtime as needed. 03/10/22   Horald Pollen, MD  GARLIC PO Take by mouth daily. Take one tablet daily    [provider]  Ginger, Zingiber officinalis, (GINGER PO) Take by mouth daily. Take one tablet daily    [provider]  linaclotide Rolan Lipa) 145 MCG CAPS capsule Take 1 capsule (145 mcg total) by mouth daily before breakfast. 02/10/22   Horald Pollen, MD  METAMUCIL FIBER PO Take by mouth as needed.    [provider]  Multiple Vitamin (MULTIVITAMIN ADULT PO) Take by mouth daily. Take tablet daily     [provider]  OVER THE COUNTER MEDICATION daily. Tumeric-Take one tablet daily    [provider]  PREVIDENT 5000 BOOSTER PLUS 1.1 % PSTE SMARTSIG:Sparingly To Teeth 05/14/21   [provider]  rosuvastatin (CRESTOR) 10 MG tablet TAKE 1 TABLET(10 MG) BY MOUTH DAILY 02/02/22   Horald Pollen, MD  tamsulosin (FLOMAX) 0.4 MG CAPS capsule TAKE 1 CAPSULE(0.4 MG) BY MOUTH DAILY 05/10/22   Horald Pollen, MD    Family History Family History  Problem Relation Age of Onset   Cancer Mother        Breast   Early death Father    Heart disease Father 25       rheumatic heart disease   Hyperlipidemia Sister    Colon cancer Neg Hx    Colon polyps Neg Hx    Crohn's disease Neg Hx    Esophageal cancer Neg Hx    Rectal cancer Neg Hx    Stomach cancer Neg Hx    Ulcerative colitis Neg Hx    Social History Social History   Tobacco Use   Smoking status: Former    Packs/day: 1.00    Years: 40.00    Total pack years: 40.00    Types: Cigarettes    Quit date: 10/04/2017    Years since quitting: 4.8    Passive exposure: Past   Smokeless tobacco: Never  Vaping Use   Vaping Use: Never used  Substance Use Topics   Alcohol use: Yes    Comment: 2-3 days/week   Drug use: Never   Allergies   Patient has no known allergies.  Review of Systems Review of Systems Pertinent findings revealed after performing a 14 point review of systems has been noted in the history of present illness.  Physical Exam Triage Vital Signs ED Triage Vitals  Enc Vitals Group     BP 08/01/21 0827 (!) 147/82     Pulse Rate 08/01/21 0827 72     Resp 08/01/21 0827 18     Temp 08/01/21 0827 98.3 F (36.8 C)     Temp Source 08/01/21 0827 Oral     SpO2 08/01/21 0827 98 %     Weight --      Height --      Head Circumference --      Peak Flow --      Pain Score 08/01/21 0826 5     Pain Loc --      Pain Edu? --      Excl. in [place]? --   No data found.  Updated Vital Signs BP  138/74 (BP Location: Right Arm)   Pulse (!) 58   Temp 98.3 F (36.8 C) (Oral)   Resp 16   SpO2 97%   Physical Exam Vitals and nursing note reviewed.  Constitutional:  General: He is not in acute distress.    Appearance: Normal appearance. He is not ill-appearing.  HENT:     Head: Normocephalic and atraumatic.     Salivary Glands: Right salivary gland is not diffusely enlarged or tender. Left salivary gland is not diffusely enlarged or tender.     Right Ear: Tympanic membrane, ear canal and external ear normal. No drainage. No middle ear effusion. There is no impacted cerumen. Tympanic membrane is not erythematous or bulging.     Left Ear: Tympanic membrane, ear canal and external ear normal. No drainage.  No middle ear effusion. There is no impacted cerumen. Tympanic membrane is not erythematous or bulging.     Nose: Nose normal. No nasal deformity, septal deviation, mucosal edema, congestion or rhinorrhea.     Right Turbinates: Not enlarged, swollen or pale.     Left Turbinates: Not enlarged, swollen or pale.     Right Sinus: No maxillary sinus tenderness or frontal sinus tenderness.     Left Sinus: No maxillary sinus tenderness or frontal sinus tenderness.     Mouth/Throat:     Lips: Pink. No lesions.     Mouth: Mucous membranes are moist. No oral lesions.     Pharynx: Oropharynx is clear. Uvula midline. No posterior oropharyngeal erythema or uvula swelling.     Tonsils: No tonsillar exudate. 0 on the right. 0 on the left.  Eyes:     General: Lids are normal.        Right eye: No discharge.        Left eye: No discharge.     Extraocular Movements: Extraocular movements intact.     Conjunctiva/sclera: Conjunctivae normal.     Right eye: Right conjunctiva is not injected.     Left eye: Left conjunctiva is not injected.  Neck:     Trachea: Trachea and phonation normal.  Cardiovascular:     Rate and Rhythm: Normal rate and regular rhythm.     Pulses: Normal pulses.     Heart  sounds: Normal heart sounds. No murmur heard.    No friction rub. No gallop.  Pulmonary:     Effort: Pulmonary effort is normal. No tachypnea, bradypnea, accessory muscle usage, prolonged expiration, respiratory distress or retractions.     Breath sounds: No stridor, decreased air movement or transmitted upper airway sounds. Examination of the right-upper field reveals wheezing and rhonchi. Examination of the left-upper field reveals wheezing and rhonchi. Examination of the right-middle field reveals wheezing and rhonchi. Examination of the left-middle field reveals wheezing and rhonchi. Examination of the right-lower field reveals wheezing and rhonchi. Examination of the left-lower field reveals wheezing and rhonchi. Wheezing and rhonchi present. No decreased breath sounds or rales.  Chest:     Chest wall: No tenderness.  Musculoskeletal:        General: Normal range of motion.     Cervical back: Normal range of motion and neck supple. Normal range of motion.  Lymphadenopathy:     Cervical: No cervical adenopathy.  Skin:    General: Skin is warm and dry.     Findings: No erythema or rash.  Neurological:     General: No focal deficit present.     Mental Status: He is alert and oriented to person, place, and time.  Psychiatric:        Mood and Affect: Mood normal.        Behavior: Behavior normal.     Visual Acuity Right Eye Distance:  Left Eye Distance:   Bilateral Distance:    Right Eye Near:   Left Eye Near:    Bilateral Near:     UC Couse / Diagnostics / Procedures:     Radiology No results found.  Procedures Procedures (including critical care time) EKG  Pending results:  Labs Reviewed - No data to display  Medications Ordered in UC: Medications  albuterol (PROVENTIL) (2.5 MG/3ML) 0.083% nebulizer solution 2.5 mg (2.5 mg Nebulization Given 08/13/22 0858)  methylPREDNISolone sodium succinate (SOLU-MEDROL) 125 mg/2 mL injection 125 mg (125 mg Intramuscular Given  08/13/22 0858)    UC Diagnoses / Final Clinical Impressions(s)   I have reviewed the triage vital signs and the nursing notes.  Pertinent labs & imaging results that were available during my care of the patient were reviewed by me and considered in my medical decision making (see chart for details).    Final diagnoses:  Bronchitis, chronic obstructive w acute bronchitis (HCC)  Wheezing on expiration   X-ray deferred due to breath sounds easily appreciated in all lung fields on exam.  Patient was provided with a Solu-Medrol injection during his visit today and had an excellent response to nebulized albuterol treatment.  Patient therefore provided with a renewal of his Breo and an albuterol HFA inhaler to use 4 times daily for the next several days.  Patient further advised to continue anti-inflammatory treatment with a tapering dose of methylprednisolone.  Mucinex recommended should his cough become more productive return precautions advised.  ED Prescriptions     Medication Sig Dispense Auth. Provider   albuterol (VENTOLIN HFA) 108 (90 Base) MCG/ACT inhaler Inhale 2 puffs into the lungs every 6 (six) hours as needed for wheezing or shortness of breath (Cough). 18 g Lynden Oxford Scales, PA-C   fluticasone furoate-vilanterol (BREO ELLIPTA) 200-25 MCG/ACT AEPB Inhale 1 puff into the lungs daily. 30 each Lynden Oxford Scales, PA-C   methylPREDNISolone (MEDROL DOSEPAK) 4 MG TBPK tablet Take 24 mg on day 1, 20 mg on day 2, 16 mg on day 3, 12 mg on day 4, 8 mg on day 5, 4 mg on day 6.  Take all tablets in each row at once, do not spread tablets out throughout the day. 21 tablet Lynden Oxford Scales, PA-C      PDMP not reviewed this encounter.  Disposition Upon Discharge:  Condition: stable for discharge home Home: take medications as prescribed; routine discharge instructions as discussed; follow up as advised.  Patient presented with an acute illness with associated systemic symptoms and  significant discomfort requiring urgent management. In my opinion, this is a condition that a prudent lay person (someone who possesses an average knowledge of health and medicine) may potentially expect to result in complications if not addressed urgently such as respiratory distress, impairment of bodily function or dysfunction of bodily organs.   Routine symptom specific, illness specific and/or disease specific instructions were discussed with the patient and/or caregiver at length.   As such, the patient has been evaluated and assessed, work-up was performed and treatment was provided in alignment with urgent care protocols and evidence based medicine.  Patient/parent/caregiver has been advised that the patient may require follow up for further testing and treatment if the symptoms continue in spite of treatment, as clinically indicated and appropriate.  If the patient was tested for COVID-19, Influenza and/or RSV, then the patient/parent/guardian was advised to isolate at home pending the results of his/her diagnostic coronavirus test and potentially longer if they're positive. I  have also advised pt that if his/her COVID-19 test returns positive, it's recommended to self-isolate for at least 10 days after symptoms first appeared AND until fever-free for 24 hours without fever reducer AND other symptoms have improved or resolved. Discussed self-isolation recommendations as well as instructions for household member/close contacts as per the [facility] and [place] DHHS, and also gave patient the [facility] packet with this information.  Patient/parent/caregiver has been advised to return to the [facility] or PCP in 3-5 days if no better; to PCP or the Emergency Department if new signs and symptoms develop, or if the current signs or symptoms continue to change or worsen for further workup, evaluation and treatment as clinically indicated and appropriate  The patient will follow up with their current PCP if and as advised. If  the patient does not currently have a PCP we will assist them in obtaining one.   The patient may need specialty follow up if the symptoms continue, in spite of conservative treatment and management, for further workup, evaluation, consultation and treatment as clinically indicated and appropriate.  Patient/parent/caregiver verbalized understanding and agreement of plan as discussed.  All questions were addressed during visit.  Please see discharge instructions below for further details of plan.  Discharge Instructions:   Discharge Instructions      Please read below to learn more about the medications, dosages and frequencies that I recommend to help alleviate your symptoms and to get you feeling better soon:   Solu-Medrol IM (methylprednisolone):  To quickly address your significant respiratory inflammation, you were provided with an injection of Solu-Medrol in the office today.  You should continue to feel the full benefit of the steroid for the next 4 to 6 hours.    Medrol Dosepak (methylprednisolone): This is a steroid that will significantly calm your upper and lower airways, please take one row of tablets daily with your breakfast meal starting tomorrow morning until the prescription is complete.      ProAir, Ventolin, Proventil (albuterol): This inhaled medication contains a short acting beta agonist bronchodilator.  This medication works on the smooth muscle that opens and constricts of your airways by relaxing the muscle.  The result of relaxation of the smooth muscle is increased air movement and improved work of breathing.  This is a short acting medication that can be used every 4-6 hours as needed for increased work of breathing, shortness of breath, wheezing and excessive coughing.  I have provided you with a prescription.    Breo (fluticasone and vilanterol): Please inhale 1 puff every day in the morning.  This inhaled medication contains a corticosteroid and long-acting form of  albuterol.  The inhaled steroid and this medication  is not absorbed into the body and will not cause side effects such as increased blood sugar levels, irritability, sleeplessness or weight gain.  Inhaled corticosteroid are sort of like topical steroid creams but, as you can imagine, it is not practical to attempt to rub a steroid cream inside of your lungs.  The long-acting albuterol works similarly to the short acting albuterol found in your rescue inhaler but provides 24-hour relaxation of the smooth muscles that open and constrict your airways; your short acting rescue inhaler can only provide for a few hours this benefit for a few hours.  Always rinse your mouth after using to remove any of all steroid powder.  Please feel free to continue using your short acting rescue inhaler as often as needed throughout the day for shortness of breath,  wheezing, and cough.     Promethazine DM: Promethazine is both a nasal decongestant and an antinausea medication that makes most patients feel fairly sleepy.  The DM is dextromethorphan, a cough suppressant found in many over-the-counter cough medications.  Please take 5 mL before bedtime to minimize your cough which will help you sleep better.  I have sent a prescription for this medication to your pharmacy.   Please follow-up within the next 5-7 days either with your primary care provider or urgent care if your symptoms do not resolve.  If you do not have a primary care provider, we will assist you in finding one.        Thank you for visiting urgent care today.  We appreciate the opportunity to participate in your care.         This office note has been dictated using Museum/gallery curator.  Unfortunately, this method of dictation can sometimes lead to typographical or grammatical errors.  I apologize for your inconvenience in advance if this occurs.  Please do not hesitate to reach out to me if clarification is needed.      Lynden Oxford  Scales, [place] 08/13/22 [phone]

## 2022-08-18 ENCOUNTER — Ambulatory Visit: Payer: Medicare Other | Admitting: Emergency Medicine

## 2022-08-18 ENCOUNTER — Ambulatory Visit (AMBULATORY_SURGERY_CENTER): Payer: Self-pay | Admitting: *Deleted

## 2022-08-18 VITALS — Ht 69.0 in | Wt 170.6 lb

## 2022-08-18 DIAGNOSIS — Z86010 Personal history of colonic polyps: Secondary | ICD-10-CM

## 2022-08-18 NOTE — Progress Notes (Signed)
No egg or soy allergy known to patient  No issues known to pt with past sedation with any surgeries or procedures Patient denies ever being told they had issues or difficulty with intubation  No FH of Malignant Hyperthermia Pt is not on diet pills Pt is not on  home 02  Pt is not on blood thinners  Pt has issues with constipation  No A fib or A flutter Have any cardiac testing pending--no Pt instructed to use [email or web address] or GoodRx for a price reduction on prep   Patient's chart reviewed by Osvaldo Angst CNRA prior to previsit and patient appropriate for the [place].  Previsit completed and red dot placed by patient's name on their procedure day (on provider's schedule).

## 2022-08-20 ENCOUNTER — Other Ambulatory Visit: Payer: Medicare Other

## 2022-08-20 ENCOUNTER — Ambulatory Visit
Admission: RE | Admit: 2022-08-20 | Discharge: 2022-08-20 | Disposition: A | Discharge status: Home / Self Care | Payer: Medicare Other | Source: Ambulatory Visit | Attending: Acute Care | Admitting: Acute Care

## 2022-08-20 DIAGNOSIS — R911 Solitary pulmonary nodule: Secondary | ICD-10-CM

## 2022-08-20 DIAGNOSIS — I700 Atherosclerosis of aorta: Secondary | ICD-10-CM | POA: Diagnosis not present

## 2022-08-20 DIAGNOSIS — Z87891 Personal history of nicotine dependence: Secondary | ICD-10-CM

## 2022-08-20 DIAGNOSIS — J439 Emphysema, unspecified: Secondary | ICD-10-CM | POA: Diagnosis not present

## 2022-08-24 ENCOUNTER — Other Ambulatory Visit: Payer: Self-pay

## 2022-08-24 DIAGNOSIS — Z122 Encounter for screening for malignant neoplasm of respiratory organs: Secondary | ICD-10-CM

## 2022-08-24 DIAGNOSIS — Z23 Encounter for immunization: Secondary | ICD-10-CM | POA: Diagnosis not present

## 2022-08-24 DIAGNOSIS — Z87891 Personal history of nicotine dependence: Secondary | ICD-10-CM

## 2022-09-01 ENCOUNTER — Encounter: Payer: Medicare Other | Admitting: Internal Medicine

## 2022-09-15 ENCOUNTER — Ambulatory Visit (INDEPENDENT_AMBULATORY_CARE_PROVIDER_SITE_OTHER): Payer: Medicare Other | Admitting: Emergency Medicine

## 2022-09-15 ENCOUNTER — Encounter: Payer: Self-pay | Admitting: Emergency Medicine

## 2022-09-15 VITALS — BP 122/76 | HR 52 | Temp 97.7°F | Ht 69.0 in | Wt 174.0 lb

## 2022-09-15 DIAGNOSIS — K5909 Other constipation: Secondary | ICD-10-CM | POA: Diagnosis not present

## 2022-09-15 DIAGNOSIS — Z23 Encounter for immunization: Secondary | ICD-10-CM | POA: Diagnosis not present

## 2022-09-15 DIAGNOSIS — Z1211 Encounter for screening for malignant neoplasm of colon: Secondary | ICD-10-CM

## 2022-09-15 DIAGNOSIS — R399 Unspecified symptoms and signs involving the genitourinary system: Secondary | ICD-10-CM | POA: Diagnosis not present

## 2022-09-15 DIAGNOSIS — I700 Atherosclerosis of aorta: Secondary | ICD-10-CM

## 2022-09-15 DIAGNOSIS — J449 Chronic obstructive pulmonary disease, unspecified: Secondary | ICD-10-CM

## 2022-09-15 DIAGNOSIS — R911 Solitary pulmonary nodule: Secondary | ICD-10-CM | POA: Diagnosis not present

## 2022-09-15 DIAGNOSIS — L989 Disorder of the skin and subcutaneous tissue, unspecified: Secondary | ICD-10-CM | POA: Diagnosis not present

## 2022-09-15 DIAGNOSIS — G4700 Insomnia, unspecified: Secondary | ICD-10-CM

## 2022-09-15 DIAGNOSIS — E785 Hyperlipidemia, unspecified: Secondary | ICD-10-CM

## 2022-09-15 MED ORDER — TAMSULOSIN HCL 0.4 MG PO CAPS: 0.4000 mg | ORAL_CAPSULE | Freq: Every day | ORAL | 3 refills | Status: DC

## 2022-09-15 MED ORDER — ESZOPICLONE 3 MG PO TABS: 3.0000 mg | ORAL_TABLET | Freq: Every day | ORAL | 3 refills | Status: DC

## 2022-09-15 NOTE — Assessment & Plan Note (Signed)
Stable.  Diet and nutrition discussed. Continue rosuvastatin 20 mg daily. The 10-year ASCVD risk score (Arnett DK, et al., 2019) is: 21.5%   Values used to calculate the score:     Age: 76 years     Sex: Male     Is Non-Hispanic African American: No     Diabetic: No     Tobacco smoker: No     Systolic Blood Pressure: 300 mmHg     Is BP treated: No     HDL Cholesterol: 57.4 mg/dL     Total Cholesterol: 146 mg/dL

## 2022-09-15 NOTE — Progress Notes (Signed)
Kevin Shepard 76 y.o.   Chief Complaint  Patient presents with   Follow-up    Having congestion for a couple of months    HISTORY OF PRESENT ILLNESS: This is a 76 y.o. male here for 1-monthfollow-up of chronic medical problems Has 2 complaints today: 1.  Right ear lesion progressively getting bigger.  Needs dermatology referral 2.  Chronic congestion for 2 months.  Was seen at urgent care center and given albuterol nebulizers and corticosteroids.  Minimal improvement. Denies fever or chills.  Has history of COPD.  Denies sputum production.  No signs of infection. No other complaints or medical concerns today.  HPI   Prior to Admission medications   Medication Sig Start Date End Date Taking? Authorizing Provider  albuterol (VENTOLIN HFA) 108 (90 Base) MCG/ACT inhaler Inhale 2 puffs into the lungs every 6 (six) hours as needed for wheezing or shortness of breath (Cough). 08/13/22  Yes MLynden OxfordScales, PA-C  Cholecalciferol (VITAMIN D3 PO) Take by mouth daily. Take one pill daily   Yes [provider]  cyclobenzaprine (FLEXERIL) 10 MG tablet Take 1 tablet (10 mg total) by mouth at bedtime. 08/13/21  Yes Jeniyah Menor, MInes Bloomer MD  Eszopiclone 3 MG TABS Take 1 tablet (3 mg total) by mouth at bedtime. Take immediately before bedtime as needed. 03/10/22  Yes Susann Lawhorne, MInes Bloomer MD  fluticasone furoate-vilanterol (BREO ELLIPTA) 200-25 MCG/ACT AEPB Inhale 1 puff into the lungs daily. 08/13/22 02/09/23 Yes MLynden OxfordScales, PA-C  GARLIC PO Take by mouth daily. Take one tablet daily   Yes [provider]  Ginger, Zingiber officinalis, (GINGER PO) Take by mouth daily. Take one tablet daily   Yes [provider]  linaclotide (LINZESS) 145 MCG CAPS capsule Take 1 capsule (145 mcg total) by mouth daily before breakfast. 02/10/22  Yes Luc Shammas, MInes Bloomer MD  METAMUCIL FIBER PO Take by mouth as needed.   Yes [provider]  methylPREDNISolone (MEDROL  DOSEPAK) 4 MG TBPK tablet Take 24 mg on day 1, 20 mg on day 2, 16 mg on day 3, 12 mg on day 4, 8 mg on day 5, 4 mg on day 6.  Take all tablets in each row at once, do not spread tablets out throughout the day. 08/13/22  Yes MLynden OxfordScales, PA-C  Multiple Vitamin (MULTIVITAMIN ADULT PO) Take by mouth daily. Take tablet daily   Yes [provider]  OVER THE COUNTER MEDICATION daily. Tumeric-Take one tablet daily   Yes [provider]  PREVIDENT 5000 BOOSTER PLUS 1.1 % PSTE SMARTSIG:Sparingly To Teeth 05/14/21  Yes [provider]  rosuvastatin (CRESTOR) 10 MG tablet TAKE 1 TABLET(10 MG) BY MOUTH DAILY 02/02/22  Yes Adir Schicker, MInes Bloomer MD  tamsulosin (FLOMAX) 0.4 MG CAPS capsule TAKE 1 CAPSULE(0.4 MG) BY MOUTH DAILY 05/10/22  Yes Pariss Hommes, MInes Bloomer MD  acyclovir (ZOVIRAX) 200 MG capsule Take 1 capsule (200 mg total) by mouth 4 (four) times daily for 7 days. At time of flare-up. 08/13/21 08/20/21  SHorald Pollen MD    No Known Allergies  Patient Active Problem List   Diagnosis Date Noted   Chronic constipation 02/10/2022   Lower urinary tract symptoms 02/10/2022   History of herpes simplex infection 08/13/2021   Dyslipidemia 01/29/2021   [place] 01/29/[place]   Insomnia 01/29/2021   Atherosclerosis of native coronary artery of native heart without angina pectoris 01/29/2021   Seborrheic keratoses 04/11/2020   Actinic keratoses 04/11/2020   COPD  GOLD II/ Group A 06/02/2018   Left upper lobe pulmonary nodule 06/02/2018   Former smoker 04/06/2018   Alcohol use 01/10/2018   Chronic bilateral low back pain without sciatica 01/10/2018   History of tobacco use 01/10/2018   History of back surgery 01/10/2018    Past Medical History:  Diagnosis Date   Cataract    beginning   Colon polyps    COPD (chronic obstructive pulmonary disease) ([place])    Hyperlipidemia     Past Surgical History:  Procedure Laterality Date    APPENDECTOMY     per patient 58's   COLONOSCOPY     Last 02/19/2017 [facility] an 8 mm polyps rectum and hepatic flexure with history of polyps prior   FRACTURE SURGERY  1974   right clavicle   LUMBAR SPINE SURGERY  2018   SPINE SURGERY  10/2016   TONSILLECTOMY      Social History   Socioeconomic History   Marital status: Married    Spouse name: Not on file   Number of children: Not on file   Years of education: Not on file   Highest education level: Not on file  Occupational History   Occupation: stock market trading  Tobacco Use   Smoking status: Former    Packs/day: 1.00    Years: 40.00    Total pack years: 40.00    Types: Cigarettes    Quit date: 10/04/2017    Years since quitting: 4.9    Passive exposure: Past   Smokeless tobacco: Never  Vaping Use   Vaping Use: Never used  Substance and Sexual Activity   Alcohol use: Yes    Comment: 2-3 days/week   Drug use: Never   Sexual activity: Yes    Partners: Female  Other Topics Concern   Not on file  Social History Narrative   Not on file   Social Determinants of Health   Financial Resource Strain: Low Risk  (05/15/2022)   Overall Financial Resource Strain (CARDIA)    Difficulty of Paying Living Expenses: Not hard at all  Food Insecurity: No Food Insecurity (05/15/2022)   Hunger Vital Sign    Worried About Running Out of Food in the Last Year: Never true    [place] in the Last Year: Never true  Transportation Needs: No Transportation Needs (05/15/2022)   PRAPARE - Hydrologist (Medical): No    Lack of Transportation (Non-Medical): No  Physical Activity: Sufficiently Active (05/15/2022)   Exercise Vital Sign    Days of Exercise per Week: 5 days    Minutes of Exercise per Session: 40 min  Stress: No Stress Concern Present (05/15/2022)   [address]    Feeling of Stress : Not at all  Social  Connections: Moderately Isolated (05/15/2022)   Social Connection and Isolation Panel [NHANES]    Frequency of Communication with Friends and Family: Three times a week    Frequency of Social Gatherings with Friends and Family: Three times a week    Attends Religious Services: Never    Active Member of Clubs or Organizations: No    Attends Archivist Meetings: Never    Marital Status: Married  Human resources officer Violence: Not At Risk (05/15/2022)   Humiliation, Afraid, Rape, and Kick questionnaire    Fear of Current or Ex-Partner: No    Emotionally Abused: No    Physically Abused: No  Sexually Abused: No    Family History  Problem Relation Age of Onset   Cancer Mother        Breast   Early death Father    Heart disease Father 38       rheumatic heart disease   Hyperlipidemia Sister    Colon cancer Neg Hx    Colon polyps Neg Hx    Crohn's disease Neg Hx    Esophageal cancer Neg Hx    Rectal cancer Neg Hx    Stomach cancer Neg Hx    Ulcerative colitis Neg Hx      Review of Systems  Constitutional: Negative.  Negative for chills and fever.  HENT:  Positive for congestion.   Respiratory:  Positive for cough. Negative for shortness of breath.   Cardiovascular: Negative.  Negative for chest pain and palpitations.  Gastrointestinal:  Negative for nausea and vomiting.  Genitourinary: Negative.   Skin:  Negative for rash.       Lesion to right earlobe  Neurological: Negative.  Negative for dizziness and headaches.  All other systems reviewed and are negative.  Today's Vitals   09/15/22 0920  BP: 122/76  Pulse: (!) 52  Temp: 97.7 F (36.5 C)  TempSrc: Oral  SpO2: 98%  Weight: 174 lb (78.9 kg)  Height: '5\' 9"'$  (1.753 m)   Body mass index is 25.7 kg/m.   Physical Exam Vitals reviewed.  Constitutional:      Appearance: Normal appearance.  HENT:     Head: Normocephalic.     Mouth/Throat:     Mouth: Mucous membranes are moist.     Pharynx: Oropharynx is  clear.  Eyes:     Extraocular Movements: Extraocular movements intact.     Conjunctiva/sclera: Conjunctivae normal.     Pupils: Pupils are equal, round, and reactive to light.  Cardiovascular:     Rate and Rhythm: Normal rate and regular rhythm.     Pulses: Normal pulses.     Heart sounds: Normal heart sounds.  Pulmonary:     Effort: Pulmonary effort is normal.     Breath sounds: Normal breath sounds.  Abdominal:     Palpations: Abdomen is soft.     Tenderness: There is no abdominal tenderness.  Musculoskeletal:     Cervical back: No tenderness.  Lymphadenopathy:     Cervical: No cervical adenopathy.  Skin:    General: Skin is warm and dry.  Neurological:     General: No focal deficit present.     Mental Status: He is alert and oriented to person, place, and time.  Psychiatric:        Mood and Affect: Mood normal.        Behavior: Behavior normal.      ASSESSMENT & PLAN: A total of 47 minutes was spent with the patient and counseling/coordination of care regarding preparing for this visit, review of most recent office visit notes, review of multiple chronic medical conditions and their management, review of all medications, review of most recent blood work results, education on nutrition, review of most recent CT of chest report, prognosis, documentation, and need for follow-up.  Problem List Items Addressed This Visit       Cardiovascular and Mediastinum   Atherosclerosis of aorta (HCC)    Stable.  Diet and nutrition discussed. Continue rosuvastatin 20 mg daily. The 10-year ASCVD risk score (Arnett DK, et al., 2019) is: 21.5%   Values used to calculate the score:     Age: 45  years     Sex: Male     Is Non-Hispanic African American: No     Diabetic: No     Tobacco smoker: No     Systolic Blood Pressure: 716 mmHg     Is BP treated: No     HDL Cholesterol: 57.4 mg/dL     Total Cholesterol: 146 mg/dL         Respiratory   COPD  GOLD II/ Group A - Primary     Stable.  Just restarted Breo Ellipta. Chronic congestion related to underlying COPD condition CT scan shows emphysema Continue albuterol as rescue inhaler.      Left upper lobe pulmonary nodule    Stable.  Most recent CT scan of chest done last month reviewed with patient.  No changes.        Digestive   Chronic constipation    Much improved.  Developed diarrhea with Linzess.  Not taking it anymore.  Continue daily fiber supplementation        Musculoskeletal and Integument   Skin lesion    Skin lesion on right ear progressively getting worse. Needs dermatology evaluation. Referral placed today.      Relevant Orders   Ambulatory referral to Dermatology     Other   Dyslipidemia    Stable.  Diet and nutrition discussed. Continue rosuvastatin 10 mg daily.      Insomnia    Well-controlled with Lunesta.  Continue 3 mg at bedtime as needed      Relevant Medications   Eszopiclone 3 MG TABS   Lower urinary tract symptoms    Flomax helping.  Continue 0.4 mg daily.      Relevant Medications   tamsulosin (FLOMAX) 0.4 MG CAPS capsule   Other Visit Diagnoses     Flu vaccine need       Relevant Orders   Flu Vaccine QUAD High Dose(Fluad) (Completed)   Colon cancer screening       Relevant Orders   Ambulatory referral to Gastroenterology        Patient Instructions  Health Maintenance After Age 40 After age 67, you are at a higher risk for certain long-term diseases and infections as well as injuries from falls. Falls are a major cause of broken bones and head injuries in people who are older than age 23. Getting regular preventive care can help to keep you healthy and well. Preventive care includes getting regular testing and making lifestyle changes as recommended by your health care provider. Talk with your health care provider about: Which screenings and tests you should have. A screening is a test that checks for a disease when you have no symptoms. A diet and  exercise plan that is right for you. What should I know about screenings and tests to prevent falls? Screening and testing are the best ways to find a health problem early. Early diagnosis and treatment give you the best chance of managing medical conditions that are common after age 22. Certain conditions and lifestyle choices may make you more likely to have a fall. Your health care provider may recommend: Regular vision checks. Poor vision and conditions such as cataracts can make you more likely to have a fall. If you wear glasses, make sure to get your prescription updated if your vision changes. Medicine review. Work with your health care provider to regularly review all of the medicines you are taking, including over-the-counter medicines. Ask your health care provider about any side effects that may make  you more likely to have a fall. Tell your health care provider if any medicines that you take make you feel dizzy or sleepy. Strength and balance checks. Your health care provider may recommend certain tests to check your strength and balance while standing, walking, or changing positions. Foot health exam. Foot pain and numbness, as well as not wearing proper footwear, can make you more likely to have a fall. Screenings, including: Osteoporosis screening. Osteoporosis is a condition that causes the bones to get weaker and break more easily. Blood pressure screening. Blood pressure changes and medicines to control blood pressure can make you feel dizzy. Depression screening. You may be more likely to have a fall if you have a fear of falling, feel depressed, or feel unable to do activities that you used to do. Alcohol use screening. Using too much alcohol can affect your balance and may make you more likely to have a fall. Follow these instructions at home: Lifestyle Do not drink alcohol if: Your health care provider tells you not to drink. If you drink alcohol: Limit how much you have  to: 0-1 drink a day for women. 0-2 drinks a day for men. Know how much alcohol is in your drink. In the [place] one drink equals one 12 oz bottle of beer (355 mL), one 5 oz glass of wine (148 mL), or one 1 oz glass of hard liquor (44 mL). Do not use any products that contain nicotine or tobacco. These products include cigarettes, chewing tobacco, and vaping devices, such as e-cigarettes. If you need help quitting, ask your health care provider. Activity  Follow a regular exercise program to stay fit. This will help you maintain your balance. Ask your health care provider what types of exercise are appropriate for you. If you need a cane or walker, use it as recommended by your health care provider. Wear supportive shoes that have nonskid soles. Safety  Remove any tripping hazards, such as rugs, cords, and clutter. Install safety equipment such as grab bars in bathrooms and safety rails on stairs. Keep rooms and walkways well-lit. General instructions Talk with your health care provider about your risks for falling. Tell your health care provider if: You fall. Be sure to tell your health care provider about all falls, even ones that seem minor. You feel dizzy, tiredness (fatigue), or off-balance. Take over-the-counter and prescription medicines only as told by your health care provider. These include supplements. Eat a healthy diet and maintain a healthy weight. A healthy diet includes low-fat dairy products, low-fat (lean) meats, and fiber from whole grains, beans, and lots of fruits and vegetables. Stay current with your vaccines. Schedule regular health, dental, and eye exams. Summary Having a healthy lifestyle and getting preventive care can help to protect your health and wellness after age 48. Screening and testing are the best way to find a health problem early and help you avoid having a fall. Early diagnosis and treatment give you the best chance for managing medical conditions that  are more common for people who are older than age 52. Falls are a major cause of broken bones and head injuries in people who are older than age 74. Take precautions to prevent a fall at home. Work with your health care provider to learn what changes you can make to improve your health and wellness and to prevent falls. This information is not intended to replace advice given to you by your health care provider. Make sure you discuss any questions you  have with your health care provider. Document Revised: 02/10/2021 Document Reviewed: 02/10/2021 Elsevier Patient Education  [place], MD [facility]

## 2022-09-15 NOTE — Patient Instructions (Signed)
Health Maintenance After Age 76 After age 76, you are at a higher risk for certain long-term diseases and infections as well as injuries from falls. Falls are a major cause of broken bones and head injuries in people who are older than age 76. Getting regular preventive care can help to keep you healthy and well. Preventive care includes getting regular testing and making lifestyle changes as recommended by your health care provider. Talk with your health care provider about: Which screenings and tests you should have. A screening is a test that checks for a disease when you have no symptoms. A diet and exercise plan that is right for you. What should I know about screenings and tests to prevent falls? Screening and testing are the best ways to find a health problem early. Early diagnosis and treatment give you the best chance of managing medical conditions that are common after age 76. Certain conditions and lifestyle choices may make you more likely to have a fall. Your health care provider may recommend: Regular vision checks. Poor vision and conditions such as cataracts can make you more likely to have a fall. If you wear glasses, make sure to get your prescription updated if your vision changes. Medicine review. Work with your health care provider to regularly review all of the medicines you are taking, including over-the-counter medicines. Ask your health care provider about any side effects that may make you more likely to have a fall. Tell your health care provider if any medicines that you take make you feel dizzy or sleepy. Strength and balance checks. Your health care provider may recommend certain tests to check your strength and balance while standing, walking, or changing positions. Foot health exam. Foot pain and numbness, as well as not wearing proper footwear, can make you more likely to have a fall. Screenings, including: Osteoporosis screening. Osteoporosis is a condition that causes  the bones to get weaker and break more easily. Blood pressure screening. Blood pressure changes and medicines to control blood pressure can make you feel dizzy. Depression screening. You may be more likely to have a fall if you have a fear of falling, feel depressed, or feel unable to do activities that you used to do. Alcohol use screening. Using too much alcohol can affect your balance and may make you more likely to have a fall. Follow these instructions at home: Lifestyle Do not drink alcohol if: Your health care provider tells you not to drink. If you drink alcohol: Limit how much you have to: 0-1 drink a day for women. 0-2 drinks a day for men. Know how much alcohol is in your drink. In the [place] one drink equals one 12 oz bottle of beer (355 mL), one 5 oz glass of wine (148 mL), or one 1 oz glass of hard liquor (44 mL). Do not use any products that contain nicotine or tobacco. These products include cigarettes, chewing tobacco, and vaping devices, such as e-cigarettes. If you need help quitting, ask your health care provider. Activity  Follow a regular exercise program to stay fit. This will help you maintain your balance. Ask your health care provider what types of exercise are appropriate for you. If you need a cane or walker, use it as recommended by your health care provider. Wear supportive shoes that have nonskid soles. Safety  Remove any tripping hazards, such as rugs, cords, and clutter. Install safety equipment such as grab bars in bathrooms and safety rails on stairs. Keep rooms and walkways   well-lit. General instructions Talk with your health care provider about your risks for falling. Tell your health care provider if: You fall. Be sure to tell your health care provider about all falls, even ones that seem minor. You feel dizzy, tiredness (fatigue), or off-balance. Take over-the-counter and prescription medicines only as told by your health care provider. These include  supplements. Eat a healthy diet and maintain a healthy weight. A healthy diet includes low-fat dairy products, low-fat (lean) meats, and fiber from whole grains, beans, and lots of fruits and vegetables. Stay current with your vaccines. Schedule regular health, dental, and eye exams. Summary Having a healthy lifestyle and getting preventive care can help to protect your health and wellness after age 76. Screening and testing are the best way to find a health problem early and help you avoid having a fall. Early diagnosis and treatment give you the best chance for managing medical conditions that are more common for people who are older than age 76. Falls are a major cause of broken bones and head injuries in people who are older than age 76. Take precautions to prevent a fall at home. Work with your health care provider to learn what changes you can make to improve your health and wellness and to prevent falls. This information is not intended to replace advice given to you by your health care provider. Make sure you discuss any questions you have with your health care provider. Document Revised: 02/10/2021 Document Reviewed: 02/10/2021 Elsevier Patient Education  2023 Elsevier Inc.  

## 2022-09-15 NOTE — Assessment & Plan Note (Signed)
Stable.  Just restarted Breo Ellipta. Chronic congestion related to underlying COPD condition CT scan shows emphysema Continue albuterol as rescue inhaler.

## 2022-09-15 NOTE — Assessment & Plan Note (Signed)
Well-controlled with Lunesta.  Continue 3 mg at bedtime as needed

## 2022-09-15 NOTE — Assessment & Plan Note (Signed)
Skin lesion on right ear progressively getting worse. Needs dermatology evaluation. Referral placed today.

## 2022-09-15 NOTE — Assessment & Plan Note (Signed)
Stable.  Diet and nutrition discussed.  Continue rosuvastatin 10 mg daily.  

## 2022-09-15 NOTE — Assessment & Plan Note (Signed)
Flomax helping.  Continue 0.4 mg daily.

## 2022-09-15 NOTE — Assessment & Plan Note (Signed)
Stable.  Most recent CT scan of chest done last month reviewed with patient.  No changes.

## 2022-09-15 NOTE — Assessment & Plan Note (Signed)
Much improved.  Developed diarrhea with Linzess.  Not taking it anymore.  Continue daily fiber supplementation

## 2022-09-22 ENCOUNTER — Telehealth: Payer: Self-pay | Admitting: Internal Medicine

## 2022-09-22 NOTE — Telephone Encounter (Signed)
Returned patient call.  Patient reports nasal and chest congestion and difficulty breathing at night.  Patient is rescheduled to 11/09/22 @ 8:00am.  Instructions updated and sent via mail

## 2022-09-22 NOTE — Telephone Encounter (Signed)
Patient rescheduled for procedure. Please update prep .  Thank you

## 2022-09-22 NOTE — Telephone Encounter (Signed)
Inbound call from patient stating that he is scheduled to have a colonoscopy with Dr. Carlean Purl on 12/21 at 2:00 and is having some cold symptoms. Requesting a call back to discuss and to see if he needs to reschedule. Please advise.

## 2022-09-23 ENCOUNTER — Encounter: Payer: Self-pay | Admitting: Emergency Medicine

## 2022-09-24 ENCOUNTER — Encounter: Payer: Medicare Other | Admitting: Internal Medicine

## 2022-09-25 ENCOUNTER — Ambulatory Visit
Admission: EM | Admit: 2022-09-25 | Discharge: 2022-09-25 | Disposition: A | Discharge status: Home / Self Care | Payer: Medicare Other | Attending: Urgent Care | Admitting: Urgent Care

## 2022-09-25 ENCOUNTER — Ambulatory Visit (INDEPENDENT_AMBULATORY_CARE_PROVIDER_SITE_OTHER): Payer: Medicare Other

## 2022-09-25 DIAGNOSIS — R0989 Other specified symptoms and signs involving the circulatory and respiratory systems: Secondary | ICD-10-CM | POA: Diagnosis not present

## 2022-09-25 DIAGNOSIS — J449 Chronic obstructive pulmonary disease, unspecified: Secondary | ICD-10-CM

## 2022-09-25 DIAGNOSIS — J209 Acute bronchitis, unspecified: Secondary | ICD-10-CM

## 2022-09-25 DIAGNOSIS — J019 Acute sinusitis, unspecified: Secondary | ICD-10-CM

## 2022-09-25 MED ORDER — CETIRIZINE HCL 1 MG/ML PO SOLN: 5.0000 mg | Freq: Every day | ORAL | 0 refills | Status: DC

## 2022-09-25 MED ORDER — PREDNISONE 50 MG PO TABS: 50.0000 mg | ORAL_TABLET | Freq: Every day | ORAL | 0 refills | Status: DC

## 2022-09-25 MED ORDER — AMOXICILLIN-POT CLAVULANATE 875-125 MG PO TABS: 1.0000 | ORAL_TABLET | Freq: Two times a day (BID) | ORAL | 0 refills | Status: DC

## 2022-09-25 MED ORDER — ALBUTEROL SULFATE HFA 108 (90 BASE) MCG/ACT IN AERS: 1.0000 | INHALATION_SPRAY | Freq: Four times a day (QID) | RESPIRATORY_TRACT | 0 refills | Status: DC | PRN

## 2022-09-25 MED ORDER — PROMETHAZINE-DM 6.25-15 MG/5ML PO SYRP: 5.0000 mL | ORAL_SOLUTION | Freq: Three times a day (TID) | ORAL | 0 refills | Status: DC | PRN

## 2022-09-25 NOTE — ED Triage Notes (Signed)
Pt presents with continued congestion. Over the last couple days he has developed genera flu symptoms bodyaches, headache, increased mucus.

## 2022-09-25 NOTE — ED Provider Notes (Signed)
[facility]  Note:  This document was prepared using Systems analyst and may include unintentional dictation errors.  MRN: [id number] DOB: Jun 05, 1946  Subjective:   Kevin Shepard is a 76 y.o. male presenting for acute on chronic sinus congestion, throat congestion, chest congestion, wheezing, chest tightness, body aches, malaise and fatigue.  Patient has had persistent sinus drainage for the past 2 months.  Has previously undergone a prednisone course for bronchitis which helped but did not completely resolve the symptoms.  Has a history of COPD and maintains his Breo Ellipta.  Has not used his albuterol inhaler.  No current facility-administered medications for this encounter.  Current Outpatient Medications:    acyclovir (ZOVIRAX) 200 MG capsule, Take 1 capsule (200 mg total) by mouth 4 (four) times daily for 7 days. At time of flare-up., Disp: 28 capsule, Rfl: 5   albuterol (VENTOLIN HFA) 108 (90 Base) MCG/ACT inhaler, Inhale 2 puffs into the lungs every 6 (six) hours as needed for wheezing or shortness of breath (Cough)., Disp: 18 g, Rfl: 0   Cholecalciferol (VITAMIN D3 PO), Take by mouth daily. Take one pill daily, Disp: , Rfl:    cyclobenzaprine (FLEXERIL) 10 MG tablet, Take 1 tablet (10 mg total) by mouth at bedtime., Disp: 30 tablet, Rfl: 1   Eszopiclone 3 MG TABS, Take 1 tablet (3 mg total) by mouth at bedtime. Take immediately before bedtime as needed., Disp: 30 tablet, Rfl: 3   fluticasone furoate-vilanterol (BREO ELLIPTA) 200-25 MCG/ACT AEPB, Inhale 1 puff into the lungs daily., Disp: 30 each, Rfl: 5   GARLIC PO, Take by mouth daily. Take one tablet daily, Disp: , Rfl:    Ginger, Zingiber officinalis, (GINGER PO), Take by mouth daily. Take one tablet daily, Disp: , Rfl:    linaclotide (LINZESS) 145 MCG CAPS capsule, Take 1 capsule (145 mcg total) by mouth daily before breakfast., Disp: 90 capsule, Rfl: 1   METAMUCIL FIBER PO, Take by mouth  as needed., Disp: , Rfl:    methylPREDNISolone (MEDROL DOSEPAK) 4 MG TBPK tablet, Take 24 mg on day 1, 20 mg on day 2, 16 mg on day 3, 12 mg on day 4, 8 mg on day 5, 4 mg on day 6.  Take all tablets in each row at once, do not spread tablets out throughout the day., Disp: 21 tablet, Rfl: 0   Multiple Vitamin (MULTIVITAMIN ADULT PO), Take by mouth daily. Take tablet daily, Disp: , Rfl:    OVER THE COUNTER MEDICATION, daily. Tumeric-Take one tablet daily, Disp: , Rfl:    PREVIDENT 5000 BOOSTER PLUS 1.1 % PSTE, SMARTSIG:Sparingly To Teeth, Disp: , Rfl:    rosuvastatin (CRESTOR) 10 MG tablet, TAKE 1 TABLET(10 MG) BY MOUTH DAILY, Disp: 90 tablet, Rfl: 3   tamsulosin (FLOMAX) 0.4 MG CAPS capsule, Take 1 capsule (0.4 mg total) by mouth daily., Disp: 90 capsule, Rfl: 3   No Known Allergies  Past Medical History:  Diagnosis Date   Cataract    beginning   Colon polyps    COPD (chronic obstructive pulmonary disease) ([place])    Hyperlipidemia      Past Surgical History:  Procedure Laterality Date   APPENDECTOMY     per patient 25's   COLONOSCOPY     Last 02/19/2017 [facility] an 8 mm polyps rectum and hepatic flexure with history of polyps prior   FRACTURE SURGERY  1974   right clavicle   LUMBAR SPINE SURGERY  2018  SPINE SURGERY  10/2016   TONSILLECTOMY      Family History  Problem Relation Age of Onset   Cancer Mother        Breast   Early death Father    Heart disease Father 41       rheumatic heart disease   Hyperlipidemia Sister    Colon cancer Neg Hx    Colon polyps Neg Hx    Crohn's disease Neg Hx    Esophageal cancer Neg Hx    Rectal cancer Neg Hx    Stomach cancer Neg Hx    Ulcerative colitis Neg Hx     Social History   Tobacco Use   Smoking status: Former    Packs/day: 1.00    Years: 40.00    Total pack years: 40.00    Types: Cigarettes    Quit date: 10/04/2017    Years since quitting: 4.9    Passive exposure: Past   Smokeless tobacco:  Never  Vaping Use   Vaping Use: Never used  Substance Use Topics   Alcohol use: Yes    Comment: 2-3 days/week   Drug use: Never    ROS   Objective:   Vitals: BP (!) 146/79 (BP Location: Right Arm)   Pulse 70   Temp 99.1 F (37.3 C) (Oral)   Resp 18   SpO2 95%   Physical Exam Constitutional:      General: He is not in acute distress.    Appearance: Normal appearance. He is well-developed. He is not ill-appearing, toxic-appearing or diaphoretic.  HENT:     Head: Normocephalic and atraumatic.     Right Ear: External ear normal.     Left Ear: External ear normal.     Nose: Congestion present. No rhinorrhea.     Mouth/Throat:     Mouth: Mucous membranes are moist.     Pharynx: Posterior oropharyngeal erythema (with associated drainage overlying pharynx) present. No oropharyngeal exudate.  Eyes:     General: No scleral icterus.       Right eye: No discharge.        Left eye: No discharge.     Extraocular Movements: Extraocular movements intact.  Cardiovascular:     Rate and Rhythm: Normal rate and regular rhythm.     Heart sounds: Normal heart sounds. No murmur heard.    No friction rub. No gallop.  Pulmonary:     Effort: Pulmonary effort is normal. No respiratory distress.     Breath sounds: No stridor. Wheezing and rhonchi present. No rales.  Neurological:     Mental Status: He is alert and oriented to person, place, and time.  Psychiatric:        Mood and Affect: Mood normal.        Behavior: Behavior normal.        Thought Content: Thought content normal.     DG Chest 2 View  Result Date: 09/25/2022 CLINICAL DATA:  Chest congestion. EXAM: CHEST - 2 VIEW COMPARISON:  August 02, 2022. FINDINGS: The heart size and mediastinal contours are within normal limits. Both lungs are clear. The visualized skeletal structures are unremarkable. IMPRESSION: No active cardiopulmonary disease. Electronically Signed   By: Marijo Conception M.D.   On: 09/25/2022 11:53      Assessment and Plan :   PDMP not reviewed this encounter.  1. Acute non-recurrent sinusitis, unspecified location   2. Acute bronchitis, unspecified organism   3. Chronic obstructive pulmonary disease, unspecified COPD type ([place])  Recommended using Augmentin given the chronic and persistent nature of his sinus symptoms.  Will otherwise manage for recurrent bronchitis and use an oral prednisone course given his pulmonary exam.  Chest x-ray negative otherwise. Counseled patient on potential for adverse effects with medications prescribed/recommended today, ER and return-to-clinic precautions discussed, patient verbalized understanding.    Jaynee Eagles, [place] 09/25/22 [phone]

## 2022-10-13 ENCOUNTER — Encounter: Payer: Self-pay | Admitting: Internal Medicine

## 2022-10-13 ENCOUNTER — Ambulatory Visit (AMBULATORY_SURGERY_CENTER): Payer: Medicare Other | Admitting: Internal Medicine

## 2022-10-13 VITALS — BP 120/70 | HR 76 | Temp 97.3°F | Resp 13 | Ht 69.0 in | Wt 170.6 lb

## 2022-10-13 DIAGNOSIS — Z86010 Personal history of colonic polyps: Secondary | ICD-10-CM

## 2022-10-13 DIAGNOSIS — D124 Benign neoplasm of descending colon: Secondary | ICD-10-CM

## 2022-10-13 DIAGNOSIS — Z09 Encounter for follow-up examination after completed treatment for conditions other than malignant neoplasm: Secondary | ICD-10-CM

## 2022-10-13 DIAGNOSIS — J449 Chronic obstructive pulmonary disease, unspecified: Secondary | ICD-10-CM | POA: Diagnosis not present

## 2022-10-13 DIAGNOSIS — E785 Hyperlipidemia, unspecified: Secondary | ICD-10-CM | POA: Diagnosis not present

## 2022-10-13 MED ORDER — SODIUM CHLORIDE 0.9 % IV SOLN: 500.0000 mL | INTRAVENOUS | Status: DC

## 2022-10-13 NOTE — Patient Instructions (Addendum)
I found and removed a tiny polyp - it was not recovered but size and appearance consistent with a benign polyp.  At your age and overall polyp history I do not recommend a routine repeat colonoscopy.  I appreciate the opportunity to care for you. Gatha Mayer, MD, [facility]  Continue your normal medications Please call office if you have issues in the future  YOU HAD AN ENDOSCOPIC PROCEDURE TODAY AT [facility]:   Refer to the procedure report that was given to you for any specific questions about what was found during the examination.  If the procedure report does not answer your questions, please call your gastroenterologist to clarify.  If you requested that your care partner not be given the details of your procedure findings, then the procedure report has been included in a sealed envelope for you to review at your convenience later.  YOU SHOULD EXPECT: Some feelings of bloating in the abdomen. Passage of more gas than usual.  Walking can help get rid of the air that was put into your GI tract during the procedure and reduce the bloating. If you had a lower endoscopy (such as a colonoscopy or flexible sigmoidoscopy) you may notice spotting of blood in your stool or on the toilet paper. If you underwent a bowel prep for your procedure, you may not have a normal bowel movement for a few days.  Please Note:  You might notice some irritation and congestion in your nose or some drainage.  This is from the oxygen used during your procedure.  There is no need for concern and it should clear up in a day or so.  SYMPTOMS TO REPORT IMMEDIATELY:  Following lower endoscopy (colonoscopy or flexible sigmoidoscopy):  Excessive amounts of blood in the stool  Significant tenderness or worsening of abdominal pains  Swelling of the abdomen that is new, acute  Fever of 100F or higher  For urgent or emergent issues, a gastroenterologist can be reached at any hour by calling [phone]  [phone]. Do not use MyChart messaging for urgent concerns.    DIET:  We do recommend a small meal at first, but then you may proceed to your regular diet.  Drink plenty of fluids but you should avoid alcoholic beverages for 24 hours.  ACTIVITY:  You should plan to take it easy for the rest of today and you should NOT DRIVE or use heavy machinery until tomorrow (because of the sedation medicines used during the test).    FOLLOW UP: Our staff will call the number listed on your records the next business day following your procedure.  We will call around 7:15- 8:00 am to check on you and address any questions or concerns that you may have regarding the information given to you following your procedure. If we do not reach you, we will leave a message.     If any biopsies were taken you will be contacted by phone or by letter within the next 1-3 weeks.  Please call us at [phone] if you have not heard about the biopsies in 3 weeks.    SIGNATURES/CONFIDENTIALITY: You and/or your care partner have signed paperwork which will be entered into your electronic medical record.  These signatures attest to the fact that that the information above on your After Visit Summary has been reviewed and is understood.  Full responsibility of the confidentiality of this discharge information lies with you and/or your care-partner.

## 2022-10-13 NOTE — Op Note (Signed)
[name] Procedure Date: 10/13/2022 3:55 PM MRN: [id number] Endoscopist: Gatha Mayer , MD, [id number] Age: 77 Referring MD:  Date of Birth: 27-Jul-1946 Gender: Male Account #: [id number] Procedure:                Colonoscopy Indications:              Surveillance: Personal history of colonic polyps                            (unknown histology) on last colonoscopy more than 5                            years ago, Last colonoscopy: May 2018 Medicines:                Monitored Anesthesia Care Procedure:                Pre-Anesthesia Assessment:                           - Prior to the procedure, a History and Physical                            was performed, and patient medications and                            allergies were reviewed. The patient's tolerance of                            previous anesthesia was also reviewed. The risks                            and benefits of the procedure and the sedation                            options and risks were discussed with the patient.                            All questions were answered, and informed consent                            was obtained. Prior Anticoagulants: The patient has                            taken no anticoagulant or antiplatelet agents. ASA                            Grade Assessment: III - A patient with severe                            systemic disease. After reviewing the risks and                            benefits, the patient was deemed in satisfactory  condition to undergo the procedure.                           After obtaining informed consent, the colonoscope                            was passed under direct vision. Throughout the                            procedure, the patient's blood pressure, pulse, and                            oxygen saturations were monitored continuously. The                            Olympus CF-HQ190L  [id number]) Colonoscope was                            introduced through the anus and advanced to the the                            cecum, identified by appendiceal orifice and                            ileocecal valve. The colonoscopy was performed                            without difficulty. The patient tolerated the                            procedure well. The quality of the bowel                            preparation was good. The ileocecal valve,                            appendiceal orifice, and rectum were photographed.                            The bowel preparation used was Miralax via split                            dose instruction. Scope In: 4:15:47 PM Scope Out: 4:30:32 PM Scope Withdrawal Time: 0 hours 12 minutes 23 seconds  Total Procedure Duration: 0 hours 14 minutes 45 seconds  Findings:                 The perianal and digital rectal examinations were                            normal. Pertinent negatives include normal prostate                            (size, shape, and consistency).  A 4 mm polyp was found in the descending colon. The                            polyp was sessile. The polyp was removed with a                            cold snare. Resection was complete, but the polyp                            tissue was not retrieved. Estimated blood loss was                            minimal.                           The exam was otherwise without abnormality on                            direct and retroflexion views. Complications:            No immediate complications. Estimated Blood Loss:     Estimated blood loss was minimal. Impression:               - One 4 mm polyp in the descending colon, removed                            with a cold snare. Complete resection. Polyp tissue                            not retrieved.                           - The examination was otherwise normal on direct                             and retroflexion views.                           - Personal history of colonic polyps. 2 polyps                            (unknown histology) max 10 mm - 02/2017 and                            apparently polyps before also Recommendation:           - Patient has a contact number available for                            emergencies. The signs and symptoms of potential                            delayed complications were discussed with the  patient. Return to normal activities tomorrow.                            Written discharge instructions were provided to the                            patient.                           - Resume previous diet.                           - Continue present medications.                           - No repeat colonoscopy due to age. Gatha Mayer, MD 10/13/2022 4:44:14 PM This report has been signed electronically.

## 2022-10-13 NOTE — Progress Notes (Unsigned)
Pt's states no medical or surgical changes since previsit or office visit.  VS obtained by DT, NT.

## 2022-10-13 NOTE — Progress Notes (Unsigned)
[facility] History and Physical   Primary Care Physician:  Horald Pollen, MD   Reason for Procedure:   Hx colon polyps  Plan:    colonoscopy     HPI: Kevin Shepard is a 77 y.o. male here for surveillance exam  5-10 mm polyps removed 2018 [place] Past Medical History:  Diagnosis Date   Blood transfusion without reported diagnosis    Cataract    beginning   Colon polyps    COPD (chronic obstructive pulmonary disease) ([place])    Hyperlipidemia     Past Surgical History:  Procedure Laterality Date   APPENDECTOMY     per patient 42's   COLONOSCOPY     Last 02/19/2017 [facility] an 8 mm polyps rectum and hepatic flexure with history of polyps prior   FRACTURE SURGERY  1974   right clavicle   LUMBAR SPINE SURGERY  2018   SPINE SURGERY  10/2016   TONSILLECTOMY      Prior to Admission medications   Medication Sig Start Date End Date Taking? Authorizing Provider  albuterol (VENTOLIN HFA) 108 (90 Base) MCG/ACT inhaler Inhale 1-2 puffs into the lungs every 6 (six) hours as needed for wheezing or shortness of breath. 09/25/22  Yes Jaynee Eagles, PA-C  cetirizine HCl (ZYRTEC) 1 MG/ML solution Take 5 mLs (5 mg total) by mouth daily. 09/25/22  Yes Jaynee Eagles, PA-C  Cholecalciferol (VITAMIN D3 PO) Take by mouth daily. Take one pill daily   Yes [provider]  Eszopiclone 3 MG TABS Take 1 tablet (3 mg total) by mouth at bedtime. Take immediately before bedtime as needed. 09/15/22  Yes Sagardia, Ines Bloomer, MD  fluticasone furoate-vilanterol (BREO ELLIPTA) 200-25 MCG/ACT AEPB Inhale 1 puff into the lungs daily. 08/13/22 02/09/23 Yes Lynden Oxford Scales, PA-C  GARLIC PO Take by mouth daily. Take one tablet daily   Yes [provider]  Ginger, Zingiber officinalis, (GINGER PO) Take by mouth daily. Take one tablet daily   Yes [provider]  linaclotide (LINZESS) 145 MCG CAPS capsule Take 1 capsule (145  mcg total) by mouth daily before breakfast. 02/10/22  Yes Sagardia, Ines Bloomer, MD  Multiple Vitamin (MULTIVITAMIN ADULT PO) Take by mouth daily. Take tablet daily   Yes [provider]  PREVIDENT 5000 BOOSTER PLUS 1.1 % PSTE SMARTSIG:Sparingly To Teeth 05/14/21  Yes [provider]  promethazine-dextromethorphan (PROMETHAZINE-DM) 6.25-15 MG/5ML syrup Take 5 mLs by mouth 3 (three) times daily as needed for cough. 09/25/22  Yes Jaynee Eagles, PA-C  rosuvastatin (CRESTOR) 10 MG tablet TAKE 1 TABLET(10 MG) BY MOUTH DAILY 02/02/22  Yes Sagardia, Ines Bloomer, MD  tamsulosin (FLOMAX) 0.4 MG CAPS capsule Take 1 capsule (0.4 mg total) by mouth daily. 09/15/22  Yes Sagardia, Ines Bloomer, MD  acyclovir (ZOVIRAX) 200 MG capsule Take 1 capsule (200 mg total) by mouth 4 (four) times daily for 7 days. At time of flare-up. 08/13/21 08/20/21  Horald Pollen, MD  amoxicillin-clavulanate (AUGMENTIN) 875-125 MG tablet Take 1 tablet by mouth 2 (two) times daily. Patient not taking: Reported on 10/13/2022 09/25/22   Jaynee Eagles, PA-C  cyclobenzaprine (FLEXERIL) 10 MG tablet Take 1 tablet (10 mg total) by mouth at bedtime. 08/13/21   Horald Pollen, MD  METAMUCIL FIBER PO Take by mouth as needed.    [provider]  OVER THE COUNTER MEDICATION daily. Tumeric-Take one tablet daily    [provider]  predniSONE (DELTASONE) 50 MG tablet Take 1  tablet (50 mg total) by mouth daily with breakfast. Patient not taking: Reported on 10/13/2022 09/25/22   Jaynee Eagles, PA-C    Current Outpatient Medications  Medication Sig Dispense Refill   albuterol (VENTOLIN HFA) 108 (90 Base) MCG/ACT inhaler Inhale 1-2 puffs into the lungs every 6 (six) hours as needed for wheezing or shortness of breath. 18 g 0   cetirizine HCl (ZYRTEC) 1 MG/ML solution Take 5 mLs (5 mg total) by mouth daily. 300 mL 0   Cholecalciferol (VITAMIN D3 PO) Take by mouth daily. Take one pill daily     Eszopiclone 3 MG TABS  Take 1 tablet (3 mg total) by mouth at bedtime. Take immediately before bedtime as needed. 30 tablet 3   fluticasone furoate-vilanterol (BREO ELLIPTA) 200-25 MCG/ACT AEPB Inhale 1 puff into the lungs daily. 30 each 5   GARLIC PO Take by mouth daily. Take one tablet daily     Ginger, Zingiber officinalis, (GINGER PO) Take by mouth daily. Take one tablet daily     linaclotide (LINZESS) 145 MCG CAPS capsule Take 1 capsule (145 mcg total) by mouth daily before breakfast. 90 capsule 1   Multiple Vitamin (MULTIVITAMIN ADULT PO) Take by mouth daily. Take tablet daily     PREVIDENT 5000 BOOSTER PLUS 1.1 % PSTE SMARTSIG:Sparingly To Teeth     promethazine-dextromethorphan (PROMETHAZINE-DM) 6.25-15 MG/5ML syrup Take 5 mLs by mouth 3 (three) times daily as needed for cough. 100 mL 0   rosuvastatin (CRESTOR) 10 MG tablet TAKE 1 TABLET(10 MG) BY MOUTH DAILY 90 tablet 3   tamsulosin (FLOMAX) 0.4 MG CAPS capsule Take 1 capsule (0.4 mg total) by mouth daily. 90 capsule 3   acyclovir (ZOVIRAX) 200 MG capsule Take 1 capsule (200 mg total) by mouth 4 (four) times daily for 7 days. At time of flare-up. 28 capsule 5   amoxicillin-clavulanate (AUGMENTIN) 875-125 MG tablet Take 1 tablet by mouth 2 (two) times daily. (Patient not taking: Reported on 10/13/2022) 14 tablet 0   cyclobenzaprine (FLEXERIL) 10 MG tablet Take 1 tablet (10 mg total) by mouth at bedtime. 30 tablet 1   METAMUCIL FIBER PO Take by mouth as needed.     OVER THE COUNTER MEDICATION daily. Tumeric-Take one tablet daily     predniSONE (DELTASONE) 50 MG tablet Take 1 tablet (50 mg total) by mouth daily with breakfast. (Patient not taking: Reported on 10/13/2022) 5 tablet 0   Current Facility-Administered Medications  Medication Dose Route Frequency Provider Last Rate Last Admin   0.9 %  sodium chloride infusion  500 mL Intravenous Continuous Gatha Mayer, MD        Allergies as of 10/13/2022   (No Known Allergies)    Family History  Problem  Relation Age of Onset   Cancer Mother        Breast   Early death Father    Heart disease Father 1       rheumatic heart disease   Hyperlipidemia Sister    Colon cancer Neg Hx    Colon polyps Neg Hx    Crohn's disease Neg Hx    Esophageal cancer Neg Hx    Rectal cancer Neg Hx    Stomach cancer Neg Hx    Ulcerative colitis Neg Hx     Social History   Socioeconomic History   Marital status: Married    Spouse name: Not on file   Number of children: Not on file   Years of education: Not on file  Highest education level: Not on file  Occupational History   Occupation: stock market trading  Tobacco Use   Smoking status: Former    Packs/day: 1.00    Years: 40.00    Total pack years: 40.00    Types: Cigarettes    Quit date: 10/04/2017    Years since quitting: 5.0    Passive exposure: Past   Smokeless tobacco: Never  Vaping Use   Vaping Use: Never used  Substance and Sexual Activity   Alcohol use: Yes    Comment: 2-3 days/week   Drug use: Never   Sexual activity: Yes    Partners: Female  Other Topics Concern   Not on file  Social History Narrative   Not on file   Social Determinants of Health   Financial Resource Strain: Low Risk  (05/15/2022)   Overall Financial Resource Strain (CARDIA)    Difficulty of Paying Living Expenses: Not hard at all  Food Insecurity: No Food Insecurity (05/15/2022)   Hunger Vital Sign    Worried About Running Out of Food in the Last Year: Never true    Ran Out of Food in the Last Year: Never true  Transportation Needs: No Transportation Needs (05/15/2022)   PRAPARE - Hydrologist (Medical): No    Lack of Transportation (Non-Medical): No  Physical Activity: Sufficiently Active (05/15/2022)   Exercise Vital Sign    Days of Exercise per Week: 5 days    Minutes of Exercise per Session: 40 min  Stress: No Stress Concern Present (05/15/2022)   [place]    Feeling of Stress : Not at all  Social Connections: Moderately Isolated (05/15/2022)   Social Connection and Isolation Panel [NHANES]    Frequency of Communication with Friends and Family: Three times a week    Frequency of Social Gatherings with Friends and Family: Three times a week    Attends Religious Services: Never    Active Member of Clubs or Organizations: No    Attends Archivist Meetings: Never    Marital Status: Married  Human resources officer Violence: Not At Risk (05/15/2022)   Humiliation, Afraid, Rape, and Kick questionnaire    Fear of Current or Ex-Partner: No    Emotionally Abused: No    Physically Abused: No    Sexually Abused: No    Review of Systems:  All other review of systems negative except as mentioned in the HPI.  Physical Exam: Vital signs BP (!) 144/57   Pulse 67   Temp (!) 97.3 F (36.3 C) (Temporal)   Ht '5\' 9"'$  (1.753 m)   Wt 170 lb 9.6 oz (77.4 kg)   SpO2 95%   BMI 25.19 kg/m   General:   Alert,  Well-developed, well-nourished, pleasant and cooperative in NAD Lungs:  Clear throughout to auscultation.   Heart:  Regular rate and rhythm; no murmurs, clicks, rubs,  or gallops. Abdomen:  Soft, nontender and nondistended. Normal bowel sounds.   Neuro/Psych:  Alert and cooperative. Normal mood and affect. A and O x 3   '@Florence Yeung'$  Simonne Maffucci, MD, [facility] [phone] (pager) 10/13/2022 4:06 PM@

## 2022-10-13 NOTE — Progress Notes (Signed)
Sedate, gd SR, tolerated procedure well, VSS, report to RN 

## 2022-10-14 ENCOUNTER — Telehealth: Payer: Self-pay | Admitting: *Deleted

## 2022-10-14 ENCOUNTER — Encounter: Payer: Self-pay | Admitting: Emergency Medicine

## 2022-10-14 ENCOUNTER — Ambulatory Visit (INDEPENDENT_AMBULATORY_CARE_PROVIDER_SITE_OTHER): Payer: Medicare Other | Admitting: Emergency Medicine

## 2022-10-14 VITALS — BP 136/78 | HR 60 | Temp 98.2°F | Ht 69.0 in | Wt 171.2 lb

## 2022-10-14 DIAGNOSIS — Z8619 Personal history of other infectious and parasitic diseases: Secondary | ICD-10-CM | POA: Diagnosis not present

## 2022-10-14 DIAGNOSIS — R053 Chronic cough: Secondary | ICD-10-CM

## 2022-10-14 DIAGNOSIS — J449 Chronic obstructive pulmonary disease, unspecified: Secondary | ICD-10-CM

## 2022-10-14 MED ORDER — ACYCLOVIR 200 MG PO CAPS: 200.0000 mg | ORAL_CAPSULE | Freq: Four times a day (QID) | ORAL | 5 refills | Status: DC

## 2022-10-14 MED ORDER — GUAIFENESIN-CODEINE 100-10 MG/5ML PO SYRP: 5.0000 mL | ORAL_SOLUTION | Freq: Every evening | ORAL | 1 refills | Status: DC | PRN

## 2022-10-14 MED ORDER — BENZONATATE 200 MG PO CAPS: 200.0000 mg | ORAL_CAPSULE | Freq: Two times a day (BID) | ORAL | 0 refills | Status: DC | PRN

## 2022-10-14 NOTE — Telephone Encounter (Signed)
Attempted to call patient for their post-procedure follow-up call. No answer. Left voicemail.   

## 2022-10-14 NOTE — Assessment & Plan Note (Addendum)
Cough management discussed. Continue over-the-counter Mucinex DM Start Tessalon 200 mg 3 times a day Cheratussin syrup at bedtime as needed May use cough drops Advised to stay well-hydrated

## 2022-10-14 NOTE — Assessment & Plan Note (Signed)
Stable.  Continues to take Breo Ellipta once a day Albuterol as rescue inhaler. Contributing to persistent cough.

## 2022-10-14 NOTE — Patient Instructions (Signed)

## 2022-10-14 NOTE — Progress Notes (Signed)
Kevin Shepard 77 y.o.   Chief Complaint  Patient presents with   Acute Visit    Persistent cough, x 3 month     HISTORY OF PRESENT ILLNESS: This is a 77 y.o. male complaining of persistent dry cough for the past 3 months History of COPD Denies fever or chills.  No other associated symptoms No other complaints or medical concerns today.  HPI   Prior to Admission medications   Medication Sig Start Date End Date Taking? Authorizing Provider  albuterol (VENTOLIN HFA) 108 (90 Base) MCG/ACT inhaler Inhale 1-2 puffs into the lungs every 6 (six) hours as needed for wheezing or shortness of breath. 09/25/22  Yes Jaynee Eagles, PA-C  benzonatate (TESSALON) 200 MG capsule Take 1 capsule (200 mg total) by mouth 2 (two) times daily as needed for cough. 10/14/22  Yes Emilyrose Darrah, Ines Bloomer, MD  cetirizine HCl (ZYRTEC) 1 MG/ML solution Take 5 mLs (5 mg total) by mouth daily. 09/25/22  Yes Jaynee Eagles, PA-C  Cholecalciferol (VITAMIN D3 PO) Take by mouth daily. Take one pill daily   Yes [provider]  cyclobenzaprine (FLEXERIL) 10 MG tablet Take 1 tablet (10 mg total) by mouth at bedtime. 08/13/21  Yes Reno Clasby, Ines Bloomer, MD  Eszopiclone 3 MG TABS Take 1 tablet (3 mg total) by mouth at bedtime. Take immediately before bedtime as needed. 09/15/22  Yes Velvia Mehrer, Ines Bloomer, MD  fluticasone furoate-vilanterol (BREO ELLIPTA) 200-25 MCG/ACT AEPB Inhale 1 puff into the lungs daily. 08/13/22 02/09/23 Yes Lynden Oxford Scales, PA-C  GARLIC PO Take by mouth daily. Take one tablet daily   Yes [provider]  Ginger, Zingiber officinalis, (GINGER PO) Take by mouth daily. Take one tablet daily   Yes [provider]  guaiFENesin-codeine (ROBITUSSIN AC) 100-10 MG/5ML syrup Take 5 mLs by mouth at bedtime as needed for cough. 10/14/22  Yes Lynn Sissel, Ines Bloomer, MD  linaclotide [facility]) 145 MCG CAPS capsule Take 1 capsule (145 mcg total) by mouth daily before breakfast. 02/10/22  Yes Clements Toro,  Ines Bloomer, MD  METAMUCIL FIBER PO Take by mouth as needed.   Yes [provider]  Multiple Vitamin (MULTIVITAMIN ADULT PO) Take by mouth daily. Take tablet daily   Yes [provider]  OVER THE COUNTER MEDICATION daily. Tumeric-Take one tablet daily   Yes [provider]  PREVIDENT 5000 BOOSTER PLUS 1.1 % PSTE SMARTSIG:Sparingly To Teeth 05/14/21  Yes [provider]  rosuvastatin (CRESTOR) 10 MG tablet TAKE 1 TABLET(10 MG) BY MOUTH DAILY 02/02/22  Yes Tamara Kenyon, Ines Bloomer, MD  tamsulosin (FLOMAX) 0.4 MG CAPS capsule Take 1 capsule (0.4 mg total) by mouth daily. 09/15/22  Yes Deen Deguia, Ines Bloomer, MD  acyclovir (ZOVIRAX) 200 MG capsule Take 1 capsule (200 mg total) by mouth 4 (four) times daily. At time of flare-up. 10/14/22 11/25/22  Horald Pollen, MD    No Known Allergies  Patient Active Problem List   Diagnosis Date Noted   Chronic constipation 02/10/2022   Lower urinary tract symptoms 02/10/2022   History of herpes simplex infection 08/13/2021   Dyslipidemia 01/29/2021   Atherosclerosis of aorta ([place]) 01/29/2021   Insomnia 01/29/2021   Atherosclerosis of native coronary artery of native heart without angina pectoris 01/29/2021   Skin lesion 10/17/2020   Seborrheic keratoses 04/11/2020   Actinic keratoses 04/11/2020   COPD  GOLD II/ Group A 06/02/2018   Left upper lobe pulmonary nodule 06/02/2018   Former smoker 04/06/2018   Alcohol use 01/10/2018   Chronic bilateral  low back pain without sciatica 01/10/2018   History of tobacco use 01/10/2018   History of back surgery 01/10/2018    Past Medical History:  Diagnosis Date   Blood transfusion without reported diagnosis    Cataract    beginning   Colon polyps    COPD (chronic obstructive pulmonary disease) ([place])    Hyperlipidemia     Past Surgical History:  Procedure Laterality Date   APPENDECTOMY     per patient 64's   COLONOSCOPY     Last 02/19/2017 [facility] an 8 mm polyps rectum and hepatic flexure with history of polyps prior   FRACTURE SURGERY  1974   right clavicle   LUMBAR SPINE SURGERY  2018   SPINE SURGERY  10/2016   TONSILLECTOMY      Social History   Socioeconomic History   Marital status: Married    Spouse name: Not on file   Number of children: Not on file   Years of education: Not on file   Highest education level: Not on file  Occupational History   Occupation: stock market trading  Tobacco Use   Smoking status: Former    Packs/day: 1.00    Years: 40.00    Total pack years: 40.00    Types: Cigarettes    Quit date: 10/04/2017    Years since quitting: 5.0    Passive exposure: Past   Smokeless tobacco: Never  Vaping Use   Vaping Use: Never used  Substance and Sexual Activity   Alcohol use: Yes    Comment: 2-3 days/week   Drug use: Never   Sexual activity: Yes    Partners: Female  Other Topics Concern   Not on file  Social History Narrative   Not on file   Social Determinants of Health   Financial Resource Strain: Low Risk  (05/15/2022)   Overall Financial Resource Strain (CARDIA)    Difficulty of Paying Living Expenses: Not hard at all  Food Insecurity: No Food Insecurity (05/15/2022)   Hunger Vital Sign    Worried About Running Out of Food in the Last Year: Never true    [place] in the Last Year: Never true  Transportation Needs: No Transportation Needs (05/15/2022)   PRAPARE - Hydrologist (Medical): No    Lack of Transportation (Non-Medical): No  Physical Activity: Sufficiently Active (05/15/2022)   Exercise Vital Sign    Days of Exercise per Week: 5 days    Minutes of Exercise per Session: 40 min  Stress: No Stress Concern Present (05/15/2022)   [place]    Feeling of Stress : Not at all  Social Connections: Moderately Isolated (05/15/2022)   Social Connection and Isolation Panel  [NHANES]    Frequency of Communication with Friends and Family: Three times a week    Frequency of Social Gatherings with Friends and Family: Three times a week    Attends Religious Services: Never    Active Member of Clubs or Organizations: No    Attends Archivist Meetings: Never    Marital Status: Married  Human resources officer Violence: Not At Risk (05/15/2022)   Humiliation, Afraid, Rape, and Kick questionnaire    Fear of Current or Ex-Partner: No    Emotionally Abused: No    Physically Abused: No    Sexually Abused: No    Family History  Problem Relation Age of Onset   Cancer Mother  Breast   Early death Father    Heart disease Father 55       rheumatic heart disease   Hyperlipidemia Sister    Colon cancer Neg Hx    Colon polyps Neg Hx    Crohn's disease Neg Hx    Esophageal cancer Neg Hx    Rectal cancer Neg Hx    Stomach cancer Neg Hx    Ulcerative colitis Neg Hx      Review of Systems  Constitutional: Negative.   HENT: Negative.  Negative for congestion and sore throat.   Respiratory:  Positive for cough. Negative for hemoptysis, sputum production and shortness of breath.   Cardiovascular: Negative.  Negative for chest pain and palpitations.  Gastrointestinal: Negative.  Negative for abdominal pain, diarrhea, nausea and vomiting.  Neurological: Negative.  Negative for dizziness and headaches.  All other systems reviewed and are negative.  Today's Vitals   10/14/22 1330  BP: 136/78  Pulse: 60  Temp: 98.2 F (36.8 C)  TempSrc: Oral  SpO2: 96%  Weight: 171 lb 4 oz (77.7 kg)  Height: '5\' 9"'$  (1.753 m)   Body mass index is 25.29 kg/m.   Physical Exam Vitals reviewed.  Constitutional:      Appearance: Normal appearance.  HENT:     Head: Normocephalic.     Mouth/Throat:     Mouth: Mucous membranes are moist.     Pharynx: Oropharynx is clear.  Eyes:     Extraocular Movements: Extraocular movements intact.     Pupils: Pupils are equal,  round, and reactive to light.  Cardiovascular:     Rate and Rhythm: Normal rate and regular rhythm.     Pulses: Normal pulses.     Heart sounds: Normal heart sounds.  Pulmonary:     Effort: Pulmonary effort is normal.     Breath sounds: Normal breath sounds.  Musculoskeletal:     Cervical back: No tenderness.  Lymphadenopathy:     Cervical: No cervical adenopathy.  Skin:    General: Skin is warm and dry.  Neurological:     General: No focal deficit present.     Mental Status: He is alert and oriented to person, place, and time.  Psychiatric:        Mood and Affect: Mood normal.        Behavior: Behavior normal.      ASSESSMENT & PLAN: A total of 43 minutes was spent with the patient and counseling/coordination of care regarding preparing for this visit, review of most recent office visit notes, review of multiple chronic medical conditions under management, review of all medications, differential diagnosis of chronic cough, cough management, COPD diagnosis and prognosis, documentation and need for follow-up.  Problem List Items Addressed This Visit       Respiratory   COPD  GOLD II/ Group A    Stable.  Continues to take Breo Ellipta once a day Albuterol as rescue inhaler. Contributing to persistent cough.      Relevant Medications   benzonatate (TESSALON) 200 MG capsule   guaiFENesin-codeine (ROBITUSSIN AC) 100-10 MG/5ML syrup     Other   History of herpes simplex infection   Relevant Medications   acyclovir (ZOVIRAX) 200 MG capsule   Persistent cough - Primary    Cough management discussed. Continue over-the-counter Mucinex DM Start Tessalon 200 mg 3 times a day Cheratussin syrup at bedtime as needed May use cough drops Advised to stay well-hydrated      Relevant Medications  benzonatate (TESSALON) 200 MG capsule   guaiFENesin-codeine (ROBITUSSIN AC) 100-10 MG/5ML syrup   Patient Instructions  Cough, Adult A cough helps to clear your throat and lungs. A  cough may be a sign of an illness or another medical condition. An acute cough may only last 2-3 weeks, while a chronic cough may last 8 or more weeks. Many things can cause a cough. They include: Germs (viruses or bacteria) that attack the airway. Breathing in things that bother (irritate) your lungs. Allergies. Asthma. Mucus that runs down the back of your throat (postnasal drip). Smoking. Acid backing up from the stomach into the tube that moves food from the mouth to the stomach (gastroesophageal reflux). Some medicines. Lung problems. Other medical conditions, such as heart failure or a blood clot in the lung (pulmonary embolism). Follow these instructions at home: Medicines Take over-the-counter and prescription medicines only as told by your doctor. Talk with your doctor before you take medicines that stop a cough (cough suppressants). Lifestyle  Do not smoke, and try not to be around smoke. Do not use any products that contain nicotine or tobacco, such as cigarettes, e-cigarettes, and chewing tobacco. If you need help quitting, ask your doctor. Drink enough fluid to keep your pee (urine) pale yellow. Avoid caffeine. Do not drink alcohol if your doctor tells you not to drink. General instructions  Watch for any changes in your cough. Tell your doctor about them. Always cover your mouth when you cough. Stay away from things that make you cough, such as perfume, candles, campfire smoke, or cleaning products. If the air is dry, use a cool mist vaporizer or humidifier in your home. If your cough is worse at night, try using extra pillows to raise your head up higher while you sleep. Rest as needed. Keep all follow-up visits as told by your doctor. This is important. Contact a doctor if: You have new symptoms. You cough up pus. Your cough does not get better after 2-3 weeks, or your cough gets worse. Cough medicine does not help your cough and you are not sleeping well. You  have pain that gets worse or pain that is not helped with medicine. You have a fever. You are losing weight and you do not know why. You have night sweats. Get help right away if: You cough up blood. You have trouble breathing. Your heartbeat is very fast. These symptoms may be an emergency. Do not wait to see if the symptoms will go away. Get medical help right away. Call your local emergency services (911 in the [place] Do not drive yourself to the hospital. Summary A cough helps to clear your throat and lungs. Many things can cause a cough. Take over-the-counter and prescription medicines only as told by your doctor. Always cover your mouth when you cough. Contact a doctor if you have new symptoms or you have a cough that does not get better or gets worse. This information is not intended to replace advice given to you by your health care provider. Make sure you discuss any questions you have with your health care provider. Document Revised: 11/10/2019 Document Reviewed: 10/10/2018 Elsevier Patient Education  [place], MD [facility]

## 2022-10-16 DIAGNOSIS — L570 Actinic keratosis: Secondary | ICD-10-CM | POA: Diagnosis not present

## 2022-10-16 DIAGNOSIS — D225 Melanocytic nevi of trunk: Secondary | ICD-10-CM | POA: Diagnosis not present

## 2022-10-16 DIAGNOSIS — X32XXXA Exposure to sunlight, initial encounter: Secondary | ICD-10-CM | POA: Diagnosis not present

## 2022-10-19 ENCOUNTER — Encounter: Payer: Self-pay | Admitting: Emergency Medicine

## 2022-10-19 DIAGNOSIS — R053 Chronic cough: Secondary | ICD-10-CM

## 2022-10-19 DIAGNOSIS — J449 Chronic obstructive pulmonary disease, unspecified: Secondary | ICD-10-CM

## 2022-10-20 NOTE — Telephone Encounter (Signed)
Refer to pulmonary please.  Diagnosis: Persistent cough/COPD Thanks.

## 2022-10-20 NOTE — Telephone Encounter (Signed)
Referral place to Pulmonary

## 2022-10-30 ENCOUNTER — Ambulatory Visit (INDEPENDENT_AMBULATORY_CARE_PROVIDER_SITE_OTHER): Payer: Medicare Other | Admitting: Pulmonary Disease

## 2022-10-30 ENCOUNTER — Telehealth: Payer: Self-pay | Admitting: Pulmonary Disease

## 2022-10-30 ENCOUNTER — Encounter: Payer: Self-pay | Admitting: Pulmonary Disease

## 2022-10-30 VITALS — BP 118/80 | HR 70 | Temp 99.2°F | Ht 69.0 in | Wt 171.4 lb

## 2022-10-30 DIAGNOSIS — Z87891 Personal history of nicotine dependence: Secondary | ICD-10-CM

## 2022-10-30 DIAGNOSIS — J309 Allergic rhinitis, unspecified: Secondary | ICD-10-CM

## 2022-10-30 DIAGNOSIS — R911 Solitary pulmonary nodule: Secondary | ICD-10-CM | POA: Diagnosis not present

## 2022-10-30 DIAGNOSIS — J449 Chronic obstructive pulmonary disease, unspecified: Secondary | ICD-10-CM | POA: Diagnosis not present

## 2022-10-30 MED ORDER — ALBUTEROL SULFATE (2.5 MG/3ML) 0.083% IN NEBU: 2.5000 mg | INHALATION_SOLUTION | Freq: Four times a day (QID) | RESPIRATORY_TRACT | 12 refills | Status: DC | PRN

## 2022-10-30 MED ORDER — TRELEGY ELLIPTA 100-62.5-25 MCG/ACT IN AEPB: 60.0000 | INHALATION_SPRAY | Freq: Every day | RESPIRATORY_TRACT | 3 refills | Status: DC

## 2022-10-30 NOTE — Telephone Encounter (Signed)
Called and spoke to Kevin Shepard at [facility] and she stated that they are just needing the dx code for the albuterol neb. And I looked at chart and updated them on code for Copd J44.9. She verbalized understanding. Nothing further needed

## 2022-10-30 NOTE — Patient Instructions (Signed)
COPD Gold Grade E: We need to break the cycle of recurrent exacerbations Stop [employer] Trelegy 1 puff daily no matter how you feel Will check lab work to look for evidence of an underlying allergy: CBC with differential, serum IgE Use albuterol nebulized twice daily for the next week then every 4 hours as needed for shortness of breath If you start to have increased mucus production let me know so that I can test it for bacteria, fungal, AFB organisms Repeat lung function test when you return to clinic in 4 to 6 weeks  Allergic rhinitis: Use fluticasone 2 sprays each nostril daily no matter how you feel Use cetirizine 10 mg daily  We will see you back in 4 to 6 weeks or sooner if needed.

## 2022-10-30 NOTE — Progress Notes (Signed)
Synopsis: Referred in January 2024 for COPD and chronic cough.  Subjective:   PATIENT ID: Kevin Shepard GENDER: male DOB: 06-04-1946, MRN: [id number]   HPI  Chief Complaint  Patient presents with   Consult    Pt states he has had a chronic dry cough x 3 months    Chronic cough for 3 months > worse at night > worse when supine > started after his wife had a cold He has been treated with prednisone multiple times.  He has been to urgent care and his primary care physician multiple times.  He has been treated with Augmentin as well as other antibiotics.  He was started on Breo.  He is using albuterol but only sparingly.  He continues to have cough and some mucus production.  However, in general he says in the last 2 or 3 days he is starting to finally feel better.  Mucus production is starting to drop off.  No recent fever or chills.  He had been producing significant amounts of mucus and having a lot of chest tightness wheezing and shortness of breath 2 weeks ago but it is finally starting to improve.  He smoked a pack of cigarettes daily from young adulthood in 11/12/2016 age 77.  He was never told as a child or young adult that he had asthma.  He was diagnosed with COPD in [place] before he moved to [place].  He saw Dr. Melvyn Novas in 2019 and had moderate airflow obstruction noted on lung function testing.  He says there is been no change in his living environment recently.  No new mold or mildew in the house.  No pets of any kind.  No changes in his day-to-day behavior.  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    Quit smoking in 2018 Review: Internal medicine records reviewed from last month.  He was seen in the context of COPD, treated with Breo, smoking cessation counseling offered  Past Medical History:  Diagnosis Date   Blood transfusion without reported diagnosis    Cataract    beginning   Colon polyps    COPD (chronic obstructive pulmonary disease) ([place])    Hyperlipidemia       Family History  Problem Relation Age of Onset   Cancer Mother        Breast   Early death Father    Heart disease Father 79       rheumatic heart disease   Hyperlipidemia Sister    Colon cancer Neg Hx    Colon polyps Neg Hx    Crohn's disease Neg Hx    Esophageal cancer Neg Hx    Rectal cancer Neg Hx    Stomach cancer Neg Hx    Ulcerative colitis Neg Hx      Social History   Socioeconomic History   Marital status: Married    Spouse name: Not on file   Number of children: Not on file   Years of education: Not on file   Highest education level: Not on file  Occupational History   Occupation: stock market trading  Tobacco Use   Smoking status: Former    Packs/day: 1.00    Years: 40.00    Total pack years: 40.00    Types: Cigarettes    Quit date: 10/04/2017    Years since quitting: 5.0    Passive exposure: Past   Smokeless tobacco: Never  Vaping Use   Vaping Use: Never used  Substance and Sexual Activity  Alcohol use: Yes    Comment: 2-3 days/week   Drug use: Never   Sexual activity: Yes    Partners: Female  Other Topics Concern   Not on file  Social History Narrative   Not on file   Social Determinants of Health   Financial Resource Strain: Low Risk  (05/15/2022)   Overall Financial Resource Strain (CARDIA)    Difficulty of Paying Living Expenses: Not hard at all  Food Insecurity: No Food Insecurity (05/15/2022)   Hunger Vital Sign    Worried About Running Out of Food in the Last Year: Never true    Ran Out of Food in the Last Year: Never true  Transportation Needs: No Transportation Needs (05/15/2022)   PRAPARE - Hydrologist (Medical): No    Lack of Transportation (Non-Medical): No  Physical Activity: Sufficiently Active (05/15/2022)   Exercise Vital Sign    Days of Exercise per Week: 5 days    Minutes of Exercise per Session: 40 min  Stress: No Stress Concern Present (05/15/2022)   [place]    Feeling of Stress : Not at all  Social Connections: Moderately Isolated (05/15/2022)   Social Connection and Isolation Panel [NHANES]    Frequency of Communication with Friends and Family: Three times a week    Frequency of Social Gatherings with Friends and Family: Three times a week    Attends Religious Services: Never    Active Member of Clubs or Organizations: No    Attends Archivist Meetings: Never    Marital Status: Married  Human resources officer Violence: Not At Risk (05/15/2022)   Humiliation, Afraid, Rape, and Kick questionnaire    Fear of Current or Ex-Partner: No    Emotionally Abused: No    Physically Abused: No    Sexually Abused: No     No Known Allergies   Outpatient Medications Prior to Visit  Medication Sig Dispense Refill   acyclovir (ZOVIRAX) 200 MG capsule Take 1 capsule (200 mg total) by mouth 4 (four) times daily. At time of flare-up. 28 capsule 5   albuterol (VENTOLIN HFA) 108 (90 Base) MCG/ACT inhaler Inhale 1-2 puffs into the lungs every 6 (six) hours as needed for wheezing or shortness of breath. 18 g 0   benzonatate (TESSALON) 200 MG capsule Take 1 capsule (200 mg total) by mouth 2 (two) times daily as needed for cough. 20 capsule 0   cetirizine HCl (ZYRTEC) 1 MG/ML solution Take 5 mLs (5 mg total) by mouth daily. 300 mL 0   Cholecalciferol (VITAMIN D3 PO) Take by mouth daily. Take one pill daily     Eszopiclone 3 MG TABS Take 1 tablet (3 mg total) by mouth at bedtime. Take immediately before bedtime as needed. 30 tablet 3   fluticasone furoate-vilanterol (BREO ELLIPTA) 200-25 MCG/ACT AEPB Inhale 1 puff into the lungs daily. 30 each 5   guaiFENesin-codeine (ROBITUSSIN AC) 100-10 MG/5ML syrup Take 5 mLs by mouth at bedtime as needed for cough. 120 mL 1   linaclotide (LINZESS) 145 MCG CAPS capsule Take 1 capsule (145 mcg total) by mouth daily before breakfast. 90 capsule 1   METAMUCIL FIBER PO Take by mouth as needed.      Multiple Vitamin (MULTIVITAMIN ADULT PO) Take by mouth daily. Take tablet daily     PREVIDENT 5000 BOOSTER PLUS 1.1 % PSTE SMARTSIG:Sparingly To Teeth     rosuvastatin (CRESTOR) 10 MG tablet TAKE 1  TABLET(10 MG) BY MOUTH DAILY 90 tablet 3   tamsulosin (FLOMAX) 0.4 MG CAPS capsule Take 1 capsule (0.4 mg total) by mouth daily. 90 capsule 3   cyclobenzaprine (FLEXERIL) 10 MG tablet Take 1 tablet (10 mg total) by mouth at bedtime. 30 tablet 1   GARLIC PO Take by mouth daily. Take one tablet daily     Ginger, Zingiber officinalis, (GINGER PO) Take by mouth daily. Take one tablet daily     OVER THE COUNTER MEDICATION daily. Tumeric-Take one tablet daily     No facility-administered medications prior to visit.    Review of Systems  Constitutional:  Negative for chills, fever, malaise/fatigue and weight loss.  HENT:  Negative for congestion, nosebleeds, sinus pain and sore throat.   Eyes:  Negative for photophobia, pain and discharge.  Respiratory:  Positive for cough, sputum production and shortness of breath. Negative for hemoptysis and wheezing.   Cardiovascular:  Negative for chest pain, palpitations, orthopnea and leg swelling.  Gastrointestinal:  Negative for abdominal pain, constipation, diarrhea, nausea and vomiting.  Genitourinary:  Negative for dysuria, frequency, hematuria and urgency.  Musculoskeletal:  Negative for back pain, joint pain, myalgias and neck pain.  Skin:  Negative for itching and rash.  Neurological:  Negative for tingling, tremors, sensory change, speech change, focal weakness, seizures, weakness and headaches.  Psychiatric/Behavioral:  Negative for memory loss, substance abuse and suicidal ideas. The patient is not nervous/anxious.       Objective:  Physical Exam   Vitals:   10/30/22 1447  BP: 118/80  Pulse: 70  Temp: 99.2 F (37.3 C)  TempSrc: Oral  SpO2: 94%  Weight: 171 lb 6.4 oz (77.7 kg)  Height: '5\' 9"'$  (1.753 m)    Gen: well appearing, no  acute distress HENT: NCAT, OP clear, neck supple without masses Eyes: PERRL, EOMi Lymph: no cervical lymphadenopathy PULM: Wheezing bilaterally, limited air movement CV: RRR, no mgr, no JVD GI: BS+, soft, nontender, no hsm Derm: no rash or skin breakdown MSK: normal bulk and tone Neuro: A&Ox4, CN II-XII intact, strength 5/5 in all 4 extremities Psyche: normal mood and affect   CBC    Component Value Date/Time   WBC 5.2 02/10/2022 0939   RBC 4.35 02/10/2022 0939   HGB 13.5 02/10/2022 0939   HGB 13.6 07/18/2019 1113   HCT 39.7 02/10/2022 0939   HCT 40.8 07/18/2019 1113   PLT 261.0 02/10/2022 0939   PLT 295 07/18/2019 1113   MCV 91.1 02/10/2022 0939   MCV 92 07/18/2019 1113   MCH 30.6 07/18/2019 1113   MCHC 34.0 02/10/2022 0939   RDW 12.9 02/10/2022 0939   RDW 12.9 07/18/2019 1113   LYMPHSABS 1.5 02/10/2022 0939   LYMPHSABS 2.2 07/18/2019 1113   MONOABS 0.5 02/10/2022 0939   EOSABS 0.1 02/10/2022 0939   EOSABS 0.3 07/18/2019 1113   BASOSABS 0.0 02/10/2022 0939   BASOSABS 0.0 07/18/2019 1113     Chest imaging: November 2023 lung cancer screening CT scan of the chest: Images independently reviewed showing what appears to be a granuloma in the lingula, very mild centrilobular emphysema and an upper lobe predominant fashion, atelectasis versus scarring right middle lobe September 25, 2022 chest x-ray images independently reviewed showing emphysematous changes, no infiltrate.  PFT: 2019 spirometry ratio 66%, FEV1 1.90 L 68% predicted  Labs:  Path:  Echo:  Heart Catheterization:       Assessment & Plan:   COPD  GOLD II/ Group A - Plan: IgE, CBC, Pulmonary  function test, Ambulatory Referral for DME, CANCELED: CBC w/Diff  Former smoker  Lung nodule  Allergic rhinitis, unspecified seasonality, unspecified trigger  Discussion: Kevin Shepard has COPD with recurrent exacerbations.  Recently mucus production has dropped off and CT scanning of his lungs in November 2023  showed chronic stable nodules but nothing to suggest an atypical lung infection.  Typically when patients have worsening of stable COPD I think about allergies, atypical lung infections, recurrent smoking, or other poorly controlled chronic illnesses.  In his particular case there is no clear change in his environment, objective evidence does not suggest so far that there is evidence of a chronic lung disease.  That all being said, he suboptimally managed right now as he is not on a long-acting muscarinic antagonist.  COPD Gold grade E: We need to break the cycle of recurrent exacerbations Stop Breo Start Trelegy 1 puff daily no matter how you feel Will check lab work to look for evidence of an underlying allergy: CBC with differential, serum IgE Use albuterol nebulized twice daily for the next week then every 4 hours as needed for shortness of breath If you start to have increased mucus production let me know so that I can test it for bacteria, fungal, AFB organisms Repeat lung function test when you return to clinic in 4 to 6 weeks  Allergic rhinitis: Use fluticasone 2 sprays each nostril daily no matter how you feel Use cetirizine 10 mg daily  Former cigarette smoker: Keep lung cancer screening annually  Pulmonary nodule seen on lung cancer screening: Continue lung cancer screening CT scans  We will see you back in 4 to 6 weeks or sooner if needed.  Immunizations: Immunization History  Administered Date(s) Administered   Fluad Quad(high Dose 65+) 07/25/2020, 09/15/2022   Influenza, High Dose Seasonal PF 06/07/2018, 07/03/2019   Influenza-Unspecified 07/05/2017, 07/13/2021   PFIZER(Purple Top)SARS-COV-2 Vaccination 11/14/2019, 12/10/2019, 08/02/2020   Pfizer Covid-19 Vaccine Bivalent Booster 18yr & up 08/24/2022   Pneumococcal Conjugate-13 06/27/2014   Pneumococcal Polysaccharide-23 02/05/2006, 05/15/2013   Rsv, Mab, NKathie Rhodes 0.5 Ml, Neonate To 24 Mos(Beyfortus) 08/24/2022    Tdap 08/16/2007   Zoster Recombinat (Shingrix) 08/24/2022     Current Outpatient Medications:    acyclovir (ZOVIRAX) 200 MG capsule, Take 1 capsule (200 mg total) by mouth 4 (four) times daily. At time of flare-up., Disp: 28 capsule, Rfl: 5   albuterol (PROVENTIL) (2.5 MG/3ML) 0.083% nebulizer solution, Take 3 mLs (2.5 mg total) by nebulization every 6 (six) hours as needed for wheezing or shortness of breath., Disp: 75 mL, Rfl: 12   albuterol (VENTOLIN HFA) 108 (90 Base) MCG/ACT inhaler, Inhale 1-2 puffs into the lungs every 6 (six) hours as needed for wheezing or shortness of breath., Disp: 18 g, Rfl: 0   benzonatate (TESSALON) 200 MG capsule, Take 1 capsule (200 mg total) by mouth 2 (two) times daily as needed for cough., Disp: 20 capsule, Rfl: 0   cetirizine HCl (ZYRTEC) 1 MG/ML solution, Take 5 mLs (5 mg total) by mouth daily., Disp: 300 mL, Rfl: 0   Cholecalciferol (VITAMIN D3 PO), Take by mouth daily. Take one pill daily, Disp: , Rfl:    Eszopiclone 3 MG TABS, Take 1 tablet (3 mg total) by mouth at bedtime. Take immediately before bedtime as needed., Disp: 30 tablet, Rfl: 3   fluticasone furoate-vilanterol (BREO ELLIPTA) 200-25 MCG/ACT AEPB, Inhale 1 puff into the lungs daily., Disp: 30 each, Rfl: 5   guaiFENesin-codeine (ROBITUSSIN AC) 100-10 MG/5ML syrup, Take 5 mLs  by mouth at bedtime as needed for cough., Disp: 120 mL, Rfl: 1   linaclotide (LINZESS) 145 MCG CAPS capsule, Take 1 capsule (145 mcg total) by mouth daily before breakfast., Disp: 90 capsule, Rfl: 1   METAMUCIL FIBER PO, Take by mouth as needed., Disp: , Rfl:    Multiple Vitamin (MULTIVITAMIN ADULT PO), Take by mouth daily. Take tablet daily, Disp: , Rfl:    PREVIDENT 5000 BOOSTER PLUS 1.1 % PSTE, SMARTSIG:Sparingly To Teeth, Disp: , Rfl:    rosuvastatin (CRESTOR) 10 MG tablet, TAKE 1 TABLET(10 MG) BY MOUTH DAILY, Disp: 90 tablet, Rfl: 3   tamsulosin (FLOMAX) 0.4 MG CAPS capsule, Take 1 capsule (0.4 mg total) by mouth  daily., Disp: 90 capsule, Rfl: 3   Fluticasone-Umeclidin-Vilant (TRELEGY ELLIPTA) 100-62.5-25 MCG/ACT AEPB, Inhale 60 each into the lungs daily., Disp: 60 each, Rfl: 3

## 2022-11-01 ENCOUNTER — Encounter: Payer: Self-pay | Admitting: Pulmonary Disease

## 2022-11-02 LAB — CBC
HCT: 39.5 % (ref 38.5–50.0)
Hemoglobin: 13.6 g/dL (ref 13.2–17.1)
MCH: 30.9 pg (ref 27.0–33.0)
MCHC: 34.4 g/dL (ref 32.0–36.0)
MCV: 89.8 fL (ref 80.0–100.0)
MPV: 9.8 fL (ref 7.5–12.5)
Platelets: 307 10*3/uL (ref 140–400)
RBC: 4.4 10*6/uL (ref 4.20–5.80)
RDW: 12.5 % (ref 11.0–15.0)
WBC: 6.8 10*3/uL (ref 3.8–10.8)

## 2022-11-02 LAB — IGE: IgE (Immunoglobulin E), Serum: 34 kU/L (ref <=114)

## 2022-11-02 NOTE — Telephone Encounter (Signed)
Pt notified via My Chart of Dr. Anastasia Pall response. Nothing further needed at this time.

## 2022-11-02 NOTE — Telephone Encounter (Signed)
Dr. Lake Bells please advise on the following My Chart message:   Kevin Shepard "Kevin Shepard"  P [facility] (supporting Juanito Doom, MD)Yesterday (9:45 AM)   RM Dr. Lake Bells recommended cetirizine '10mg'$  daily. I have a prescription syrup, 1/2 bottle. Should I use this, or need a prescription for pills, or OTC Zyrtec? Or, are they all the same?   Thank you

## 2022-11-09 ENCOUNTER — Encounter: Payer: Medicare Other | Admitting: Internal Medicine

## 2022-11-10 ENCOUNTER — Encounter: Payer: Self-pay | Admitting: Pulmonary Disease

## 2022-11-10 DIAGNOSIS — R053 Chronic cough: Secondary | ICD-10-CM

## 2022-11-10 MED ORDER — PREDNISONE 20 MG PO TABS: 40.0000 mg | ORAL_TABLET | Freq: Every day | ORAL | 0 refills | Status: DC

## 2022-11-10 MED ORDER — DOXYCYCLINE HYCLATE 100 MG PO TABS: 100.0000 mg | ORAL_TABLET | Freq: Two times a day (BID) | ORAL | 0 refills | Status: DC

## 2022-11-10 NOTE — Telephone Encounter (Signed)
Called and spoke to pt. Pt states his breathing has slowly worsened since his last OV with BQ on 10/30/22. Pt c/o increase in ShOB, cough with little mucus production, chest congestion, orthopnea, wheezing. Pt states he feels he is unable to take a full breath, feels restrictive. Pt states he has been intermittently checking his pulse/ox at rest ranging from 87%-92% on RA. Pt states he has not noticed an improvement in his breathing since starting Trelegy. Pt also taking albuterol neb BID as directed. Pt denies swelling and fever. Pt states he has been sleeping sitting up. Pt has been schedule to see BQ on 2/13 (already has OV and PFT on 2/21). The 2/13 appt is the soonest in office OV with BQ and APPs.   Dr. Lake Bells, please advise. Thanks!

## 2022-11-10 NOTE — Telephone Encounter (Signed)
Kevin Doom, MD  to Collier [place], RN     11/10/22  3:51 PM  Sputum culture for all three Call in prednisone '40mg'$  daily x 5 days Doxycline '100mg'$  po bid x 5 days  Take the doxycycline after providing the mucus sample  ER or Acute OV if no improvement or worse   I called and spoke with the pt and notified of response per Dr Lake Bells. He verbalized understanding. I have sent rxs to pharm. He understands not to begin abx until provides sputum. Lab orders placed and I spoke place cups and instructions at the front. He will pick up this afternoon. Nothing further needed.

## 2022-11-11 ENCOUNTER — Other Ambulatory Visit: Payer: Medicare Other

## 2022-11-11 ENCOUNTER — Institutional Professional Consult (permissible substitution): Payer: Medicare Other | Admitting: Pulmonary Disease

## 2022-11-11 DIAGNOSIS — R053 Chronic cough: Secondary | ICD-10-CM

## 2022-11-12 ENCOUNTER — Other Ambulatory Visit: Payer: Medicare Other

## 2022-11-12 DIAGNOSIS — R053 Chronic cough: Secondary | ICD-10-CM

## 2022-11-17 ENCOUNTER — Encounter: Payer: Self-pay | Admitting: Pulmonary Disease

## 2022-11-17 ENCOUNTER — Ambulatory Visit (INDEPENDENT_AMBULATORY_CARE_PROVIDER_SITE_OTHER): Payer: Medicare Other | Admitting: Pulmonary Disease

## 2022-11-17 VITALS — BP 132/72 | HR 67 | Temp 98.0°F | Ht 69.0 in | Wt 168.2 lb

## 2022-11-17 DIAGNOSIS — J441 Chronic obstructive pulmonary disease with (acute) exacerbation: Secondary | ICD-10-CM | POA: Diagnosis not present

## 2022-11-17 MED ORDER — DOXYCYCLINE HYCLATE 100 MG PO TABS: 100.0000 mg | ORAL_TABLET | Freq: Two times a day (BID) | ORAL | 0 refills | Status: DC

## 2022-11-17 NOTE — Patient Instructions (Signed)
COPD, Gold grade E with recurrent exacerbations We await the results of your AFB sputum culture If that culture is negative we will either start Roflumilast or azithromycin on the next visit Keep using Trelegy 1 puff daily no matter how you feel I will send a prescription of doxycycline to your pharmacy to have as needed if you develop worsening chest congestion or mucus production Lung function test next week We will cancel next week's visit and we will reschedule it for mid March  We will see you back in mid March, sooner if needed.

## 2022-11-17 NOTE — Progress Notes (Signed)
Synopsis: Referred in January 2024 for COPD and chronic cough.  Has allergic rhinitis Former smoker, quit in 2018 after smoking a pack of cigarettes daily from young adulthood until age 77  Subjective:   PATIENT ID: Kevin Shepard GENDER: male DOB: 02-18-1946, MRN: [id number]   HPI  Chief Complaint  Patient presents with   Follow-up    Sob short distance, cough, today is last day for pred, and antibiotic, abnormal lab results. Pt is feeling better then he was.     Since the last visit Mikki Santee had another exacerbation of COPD.  He had significant chest congestion, dyspnea, wheezing and mucus production.  He has been more short of breath than baseline.  We called in a prescription for prednisone and for doxycycline which she took and he said these were quite helpful and now he feels much better.  He still has some cough and congestion.  He is using Mucinex at home and a flutter valve every now and then.  He has switched from [facility] to Trelegy.    Last visit stopped Breo, started Trelegy, started on allergic rhinitis treatment, take Breo off list    Past Medical History:  Diagnosis Date   Blood transfusion without reported diagnosis    Cataract    beginning   Colon polyps    COPD (chronic obstructive pulmonary disease) ([place])    Hyperlipidemia       Review of Systems  Constitutional:  Negative for chills, fever, malaise/fatigue and weight loss.  HENT:  Negative for congestion, sinus pain and sore throat.   Respiratory:  Positive for cough and sputum production. Negative for shortness of breath.   Cardiovascular:  Negative for chest pain and leg swelling.      Objective:  Physical Exam   Vitals:   11/17/22 1134  BP: 132/72  Pulse: 67  Temp: 98 F (36.7 C)  TempSrc: Oral  SpO2: 96%  Weight: 168 lb 3.2 oz (76.3 kg)  Height: 5' 9"$  (1.753 m)    Gen: well appearing HENT: OP clear, neck supple PULM: Rhonchi/wheezing bilaterally B, normal effort  CV: RRR, no mgr GI: BS+,  soft, nontender Derm: no cyanosis or rash Psyche: normal mood and affect    CBC    Component Value Date/Time   WBC 6.8 10/30/2022 1534   RBC 4.40 10/30/2022 1534   HGB 13.6 10/30/2022 1534   HGB 13.6 07/18/2019 1113   HCT 39.5 10/30/2022 1534   HCT 40.8 07/18/2019 1113   PLT 307 10/30/2022 1534   PLT 295 07/18/2019 1113   MCV 89.8 10/30/2022 1534   MCV 92 07/18/2019 1113   MCH 30.9 10/30/2022 1534   MCHC 34.4 10/30/2022 1534   RDW 12.5 10/30/2022 1534   RDW 12.9 07/18/2019 1113   LYMPHSABS 1.5 02/10/2022 0939   LYMPHSABS 2.2 07/18/2019 1113   MONOABS 0.5 02/10/2022 0939   EOSABS 0.1 02/10/2022 0939   EOSABS 0.3 07/18/2019 1113   BASOSABS 0.0 02/10/2022 0939   BASOSABS 0.0 07/18/2019 1113     Chest imaging: November 2023 lung cancer screening CT scan of the chest: Images independently reviewed showing what appears to be a granuloma in the lingula, very mild centrilobular emphysema and an upper lobe predominant fashion, atelectasis versus scarring right middle lobe September 25, 2022 chest x-ray images independently reviewed showing emphysematous changes, no infiltrate.  PFT: 2019 spirometry ratio 66%, FEV1 1.90 L 68% predicted  Labs: January 2024 hemoglobin normal, IgE normal January 2024 respiratory culture Moraxella catarrhalis February 2024  fungal culture Candida albicans February 2024 AFB culture pending   Path:  Echo:  Heart Catheterization:       Assessment & Plan:   COPD with acute exacerbation ([place])  Discussion: Mikki Santee had another exacerbation of COPD.  I told him that we still need to wait on the AFB culture to see if he has an atypical Mycobacterium.  If not, then we will need to start him on either daily azithromycin or Roflumilast.  We talked about the risks and benefits of both of these today.  We also talked about regular Mucinex use and appropriate flutter valve use.  I remain concerned about him because recurrent exacerbations are predictive of  decline in severe COPD.  Lab work did not show evidence of an allergy.  Plan: COPD, Gold grade E with recurrent exacerbations We await the results of your AFB sputum culture If that culture is negative we will either start Roflumilast or azithromycin on the next visit Keep using Trelegy 1 puff daily no matter how you feel I will send a prescription of doxycycline to your pharmacy to have as needed if you develop worsening chest congestion or mucus production Lung function test next week We will cancel next week's visit and we will reschedule it for mid March  We will see you back in mid March, sooner if needed.  Immunizations: Immunization History  Administered Date(s) Administered   Fluad Quad(high Dose 65+) 07/25/2020, 09/15/2022   Influenza, High Dose Seasonal PF 06/07/2018, 07/03/2019   Influenza-Unspecified 07/05/2017, 07/13/2021   PFIZER(Purple Top)SARS-COV-2 Vaccination 11/14/2019, 12/10/2019, 08/02/2020   Pfizer Covid-19 Vaccine Bivalent Booster 67yr & up 08/24/2022   Pneumococcal Conjugate-13 06/27/2014   Pneumococcal Polysaccharide-23 02/05/2006, 05/15/2013   Rsv, Mab, NKathie Rhodes 0.5 Ml, Neonate To 24 Mos(Beyfortus) 08/24/2022   Tdap 08/16/2007   Zoster Recombinat (Shingrix) 08/24/2022     Current Outpatient Medications:    acyclovir (ZOVIRAX) 200 MG capsule, Take 1 capsule (200 mg total) by mouth 4 (four) times daily. At time of flare-up., Disp: 28 capsule, Rfl: 5   albuterol (PROVENTIL) (2.5 MG/3ML) 0.083% nebulizer solution, Take 3 mLs (2.5 mg total) by nebulization every 6 (six) hours as needed for wheezing or shortness of breath., Disp: 75 mL, Rfl: 12   albuterol (VENTOLIN HFA) 108 (90 Base) MCG/ACT inhaler, Inhale 1-2 puffs into the lungs every 6 (six) hours as needed for wheezing or shortness of breath., Disp: 18 g, Rfl: 0   benzonatate (TESSALON) 200 MG capsule, Take 1 capsule (200 mg total) by mouth 2 (two) times daily as needed for cough., Disp: 20  capsule, Rfl: 0   cetirizine HCl (ZYRTEC) 1 MG/ML solution, Take 5 mLs (5 mg total) by mouth daily., Disp: 300 mL, Rfl: 0   Cholecalciferol (VITAMIN D3 PO), Take by mouth daily. Take one pill daily, Disp: , Rfl:    doxycycline (VIBRA-TABS) 100 MG tablet, Take 1 tablet (100 mg total) by mouth 2 (two) times daily., Disp: 10 tablet, Rfl: 0   Eszopiclone 3 MG TABS, Take 1 tablet (3 mg total) by mouth at bedtime. Take immediately before bedtime as needed., Disp: 30 tablet, Rfl: 3   Fluticasone-Umeclidin-Vilant (TRELEGY ELLIPTA) 100-62.5-25 MCG/ACT AEPB, Inhale 60 each into the lungs daily., Disp: 60 each, Rfl: 3   guaiFENesin-codeine (ROBITUSSIN AC) 100-10 MG/5ML syrup, Take 5 mLs by mouth at bedtime as needed for cough., Disp: 120 mL, Rfl: 1   linaclotide (LINZESS) 145 MCG CAPS capsule, Take 1 capsule (145 mcg total) by mouth daily before breakfast., Disp:  90 capsule, Rfl: 1   METAMUCIL FIBER PO, Take by mouth as needed., Disp: , Rfl:    Multiple Vitamin (MULTIVITAMIN ADULT PO), Take by mouth daily. Take tablet daily, Disp: , Rfl:    PREVIDENT 5000 BOOSTER PLUS 1.1 % PSTE, SMARTSIG:Sparingly To Teeth, Disp: , Rfl:    rosuvastatin (CRESTOR) 10 MG tablet, TAKE 1 TABLET(10 MG) BY MOUTH DAILY, Disp: 90 tablet, Rfl: 3   tamsulosin (FLOMAX) 0.4 MG CAPS capsule, Take 1 capsule (0.4 mg total) by mouth daily., Disp: 90 capsule, Rfl: 3   doxycycline (VIBRA-TABS) 100 MG tablet, Take 1 tablet (100 mg total) by mouth 2 (two) times daily. (Patient not taking: Reported on 11/17/2022), Disp: 10 tablet, Rfl: 0   predniSONE (DELTASONE) 20 MG tablet, Take 2 tablets (40 mg total) by mouth daily with breakfast. (Patient not taking: Reported on 11/17/2022), Disp: 10 tablet, Rfl: 0

## 2022-11-18 ENCOUNTER — Encounter: Payer: Self-pay | Admitting: Pulmonary Disease

## 2022-11-18 MED ORDER — ALBUTEROL SULFATE (2.5 MG/3ML) 0.083% IN NEBU: 2.5000 mg | INHALATION_SOLUTION | Freq: Four times a day (QID) | RESPIRATORY_TRACT | 12 refills | Status: DC | PRN

## 2022-11-19 ENCOUNTER — Telehealth: Payer: Self-pay

## 2022-11-19 NOTE — Telephone Encounter (Signed)
Spoke with patient regarding Sputum culture.Patient stated he taken the 1st round of Doxycycline and was wondering if he should take the 2nd round of doxycycline.   [id number] can you please advise.  Thank you

## 2022-11-20 NOTE — Telephone Encounter (Signed)
LVM for patient to call our office back regarding prior message.

## 2022-11-23 DIAGNOSIS — J449 Chronic obstructive pulmonary disease, unspecified: Secondary | ICD-10-CM | POA: Diagnosis not present

## 2022-11-23 NOTE — Telephone Encounter (Signed)
Called and spoke with patient. Patient verbalized understanding. Nothing further needed.  

## 2022-11-25 ENCOUNTER — Ambulatory Visit (INDEPENDENT_AMBULATORY_CARE_PROVIDER_SITE_OTHER): Payer: Medicare Other | Admitting: Pulmonary Disease

## 2022-11-25 ENCOUNTER — Ambulatory Visit: Payer: Medicare Other | Admitting: Pulmonary Disease

## 2022-11-25 DIAGNOSIS — J449 Chronic obstructive pulmonary disease, unspecified: Secondary | ICD-10-CM

## 2022-11-25 LAB — PULMONARY FUNCTION TEST
DL/VA % pred: 93 %
DL/VA: 3.72 ml/min/mmHg/L
DLCO cor % pred: 77 %
DLCO cor: 18.66 ml/min/mmHg
DLCO unc % pred: 74 %
DLCO unc: 18.11 ml/min/mmHg
FEF 25-75 Post: 1.53 L/sec
FEF 25-75 Pre: 0.95 L/sec
FEF2575-%Change-Post: 61 %
FEF2575-%Pred-Post: 73 %
FEF2575-%Pred-Pre: 45 %
FEV1-%Change-Post: 16 %
FEV1-%Pred-Post: 67 %
FEV1-%Pred-Pre: 57 %
FEV1-Post: 1.96 L
FEV1-Pre: 1.68 L
FEV1FVC-%Change-Post: 10 %
FEV1FVC-%Pred-Pre: 93 %
FEV6-%Change-Post: 4 %
FEV6-%Pred-Post: 68 %
FEV6-%Pred-Pre: 65 %
FEV6-Post: 2.57 L
FEV6-Pre: 2.46 L
FEV6FVC-%Change-Post: -1 %
FEV6FVC-%Pred-Post: 103 %
FEV6FVC-%Pred-Pre: 105 %
FVC-%Change-Post: 6 %
FVC-%Pred-Post: 65 %
FVC-%Pred-Pre: 61 %
FVC-Post: 2.64 L
FVC-Pre: 2.49 L
Post FEV1/FVC ratio: 74 %
Post FEV6/FVC ratio: 97 %
Pre FEV1/FVC ratio: 68 %
Pre FEV6/FVC Ratio: 99 %
RV % pred: 96 %
RV: 2.42 L
TLC % pred: 80 %
TLC: 5.5 L

## 2022-11-25 NOTE — Progress Notes (Signed)
Full PFT Performed Today. 

## 2022-11-25 NOTE — Patient Instructions (Signed)
Full PFT Performed Today. 

## 2022-12-07 LAB — RESPIRATORY CULTURE OR RESPIRATORY AND SPUTUM CULTURE
MICRO NUMBER:: 14532187
RESULT:: NORMAL
SPECIMEN QUALITY:: ADEQUATE

## 2022-12-07 LAB — FUNGUS CULTURE W SMEAR
MICRO NUMBER:: 14532186
SMEAR:: NONE SEEN
SPECIMEN QUALITY:: ADEQUATE

## 2022-12-11 ENCOUNTER — Encounter: Payer: Self-pay | Admitting: Pulmonary Disease

## 2022-12-11 ENCOUNTER — Ambulatory Visit (INDEPENDENT_AMBULATORY_CARE_PROVIDER_SITE_OTHER): Payer: Medicare Other | Admitting: Pulmonary Disease

## 2022-12-11 VITALS — BP 120/70 | HR 75 | Ht 69.0 in | Wt 169.4 lb

## 2022-12-11 DIAGNOSIS — Z87891 Personal history of nicotine dependence: Secondary | ICD-10-CM | POA: Diagnosis not present

## 2022-12-11 DIAGNOSIS — Z122 Encounter for screening for malignant neoplasm of respiratory organs: Secondary | ICD-10-CM

## 2022-12-11 DIAGNOSIS — J449 Chronic obstructive pulmonary disease, unspecified: Secondary | ICD-10-CM | POA: Diagnosis not present

## 2022-12-11 DIAGNOSIS — Z79899 Other long term (current) drug therapy: Secondary | ICD-10-CM | POA: Diagnosis not present

## 2022-12-11 MED ORDER — AZITHROMYCIN 250 MG PO TABS: 250.0000 mg | ORAL_TABLET | ORAL | 2 refills | Status: DC

## 2022-12-11 NOTE — Progress Notes (Signed)
Synopsis: Referred in March 2024 for COPD by Horald Pollen, *  Subjective:   PATIENT ID: Kevin Shepard GENDER: male DOB: [birth year], MRN: [id number]  Chief Complaint  Patient presents with   Follow-up    F/up PFT    This is a 77 year old gentleman, past medical history of cataracts, COPD, hyperlipidemia.  Initially seen in the office in 2019 by Dr. Mortimer Fries.  Reestablished back in January 2024.  He had chronic cough for the past 3 months.  He is smoked a pack a day of cigarettes from young adulthood through February 2018.  He had PFTs in 2019 that showed moderate airflow obstruction.  Last office visit was here for CT follow-up.  He was started on Trelegy 1 puff daily, as needed albuterol.  Plan for repeat follow-up in 4 to 6 weeks after that office visit.  Was seen again in February 2024.  Was being considered to start Roflumilast or azithromycin.  Was awaiting culture for AFB.  And plans to return today to review pulmonary function test.  Pulmonary function test were completed on 11/25/2022.  PFTs revealed a reduced ratio and a FEV1 of 67% predicted, 1.96 L postbronchodilator response with 16% change, TLC of 80% and DLCO 74%.  OV 12/11/2022: Here today for follow-up.  Still having daily cough and sputum production.  No previous cardiac issues.  Does not have any hearing issues.    Past Medical History:  Diagnosis Date   Blood transfusion without reported diagnosis    Cataract    beginning   Colon polyps    COPD (chronic obstructive pulmonary disease) ([place])    Hyperlipidemia      Family History  Problem Relation Age of Onset   Cancer Mother        Breast   Early death Father    Heart disease Father 83       rheumatic heart disease   Hyperlipidemia Sister    Colon cancer Neg Hx    Colon polyps Neg Hx    Crohn's disease Neg Hx    Esophageal cancer Neg Hx    Rectal cancer Neg Hx    Stomach cancer Neg Hx    Ulcerative colitis Neg Hx      Past Surgical History:  Procedure  Laterality Date   APPENDECTOMY     per patient 16's   COLONOSCOPY     Last 02/19/2017 [facility] an 8 mm polyps rectum and hepatic flexure with history of polyps prior   FRACTURE SURGERY  1974   right clavicle   LUMBAR SPINE SURGERY  2018   SPINE SURGERY  10/2016   TONSILLECTOMY      Social History   Socioeconomic History   Marital status: Married    Spouse name: Not on file   Number of children: Not on file   Years of education: Not on file   Highest education level: Not on file  Occupational History   Occupation: stock market trading  Tobacco Use   Smoking status: Former    Packs/day: 1.00    Years: 40.00    Total pack years: 40.00    Types: Cigarettes    Quit date: 10/04/2017    Years since quitting: 5.1    Passive exposure: Past   Smokeless tobacco: Never  Vaping Use   Vaping Use: Never used  Substance and Sexual Activity   Alcohol use: Yes    Comment: 2-3 days/week   Drug use: Never   Sexual activity: Yes  Partners: Female  Other Topics Concern   Not on file  Social History Narrative   Not on file   Social Determinants of Health   Financial Resource Strain: Low Risk  (05/15/2022)   Overall Financial Resource Strain (CARDIA)    Difficulty of Paying Living Expenses: Not hard at all  Food Insecurity: No Food Insecurity (05/15/2022)   Hunger Vital Sign    Worried About Running Out of Food in the Last Year: Never true    Ran Out of Food in the Last Year: Never true  Transportation Needs: No Transportation Needs (05/15/2022)   PRAPARE - Hydrologist (Medical): No    Lack of Transportation (Non-Medical): No  Physical Activity: Sufficiently Active (05/15/2022)   Exercise Vital Sign    Days of Exercise per Week: 5 days    Minutes of Exercise per Session: 40 min  Stress: No Stress Concern Present (05/15/2022)   [place]    Feeling of Stress :  Not at all  Social Connections: Moderately Isolated (05/15/2022)   Social Connection and Isolation Panel [NHANES]    Frequency of Communication with Friends and Family: Three times a week    Frequency of Social Gatherings with Friends and Family: Three times a week    Attends Religious Services: Never    Active Member of Clubs or Organizations: No    Attends Archivist Meetings: Never    Marital Status: Married  Human resources officer Violence: Not At Risk (05/15/2022)   Humiliation, Afraid, Rape, and Kick questionnaire    Fear of Current or Ex-Partner: No    Emotionally Abused: No    Physically Abused: No    Sexually Abused: No     No Known Allergies   Outpatient Medications Prior to Visit  Medication Sig Dispense Refill   albuterol (PROVENTIL) (2.5 MG/3ML) 0.083% nebulizer solution Take 3 mLs (2.5 mg total) by nebulization every 6 (six) hours as needed for wheezing or shortness of breath. 75 mL 12   albuterol (VENTOLIN HFA) 108 (90 Base) MCG/ACT inhaler Inhale 1-2 puffs into the lungs every 6 (six) hours as needed for wheezing or shortness of breath. 18 g 0   Cholecalciferol (VITAMIN D3 PO) Take by mouth daily. Take one pill daily     Eszopiclone 3 MG TABS Take 1 tablet (3 mg total) by mouth at bedtime. Take immediately before bedtime as needed. 30 tablet 3   Fluticasone-Umeclidin-Vilant (TRELEGY ELLIPTA) 100-62.5-25 MCG/ACT AEPB Inhale 60 each into the lungs daily. 60 each 3   linaclotide (LINZESS) 145 MCG CAPS capsule Take 1 capsule (145 mcg total) by mouth daily before breakfast. 90 capsule 1   METAMUCIL FIBER PO Take by mouth as needed.     Multiple Vitamin (MULTIVITAMIN ADULT PO) Take by mouth daily. Take tablet daily     PREVIDENT 5000 BOOSTER PLUS 1.1 % PSTE SMARTSIG:Sparingly To Teeth     rosuvastatin (CRESTOR) 10 MG tablet TAKE 1 TABLET(10 MG) BY MOUTH DAILY 90 tablet 3   tamsulosin (FLOMAX) 0.4 MG CAPS capsule Take 1 capsule (0.4 mg total) by mouth daily. 90 capsule 3    acyclovir (ZOVIRAX) 200 MG capsule Take 1 capsule (200 mg total) by mouth 4 (four) times daily. At time of flare-up. 28 capsule 5   benzonatate (TESSALON) 200 MG capsule Take 1 capsule (200 mg total) by mouth 2 (two) times daily as needed for cough. (Patient not taking: Reported on 12/11/2022) 20 capsule  0   cetirizine HCl (ZYRTEC) 1 MG/ML solution Take 5 mLs (5 mg total) by mouth daily. (Patient not taking: Reported on 12/11/2022) 300 mL 0   doxycycline (VIBRA-TABS) 100 MG tablet Take 1 tablet (100 mg total) by mouth 2 (two) times daily. (Patient not taking: Reported on 11/17/2022) 10 tablet 0   doxycycline (VIBRA-TABS) 100 MG tablet Take 1 tablet (100 mg total) by mouth 2 (two) times daily. (Patient not taking: Reported on 12/11/2022) 10 tablet 0   guaiFENesin-codeine (ROBITUSSIN AC) 100-10 MG/5ML syrup Take 5 mLs by mouth at bedtime as needed for cough. (Patient not taking: Reported on 12/11/2022) 120 mL 1   predniSONE (DELTASONE) 20 MG tablet Take 2 tablets (40 mg total) by mouth daily with breakfast. (Patient not taking: Reported on 11/17/2022) 10 tablet 0   No facility-administered medications prior to visit.    Review of Systems  Constitutional:  Negative for chills, fever, malaise/fatigue and weight loss.  HENT:  Negative for hearing loss, sore throat and tinnitus.   Eyes:  Negative for blurred vision and double vision.  Respiratory:  Positive for sputum production and shortness of breath. Negative for cough, hemoptysis, wheezing and stridor.   Cardiovascular:  Negative for chest pain, palpitations, orthopnea, leg swelling and PND.  Gastrointestinal:  Negative for abdominal pain, constipation, diarrhea, heartburn, nausea and vomiting.  Genitourinary:  Negative for dysuria, hematuria and urgency.  Musculoskeletal:  Negative for joint pain and myalgias.  Skin:  Negative for itching and rash.  Neurological:  Negative for dizziness, tingling, weakness and headaches.  Endo/Heme/Allergies:  Negative  for environmental allergies. Does not bruise/bleed easily.  Psychiatric/Behavioral:  Negative for depression. The patient is not nervous/anxious and does not have insomnia.   All other systems reviewed and are negative.    Objective:  Physical Exam Vitals reviewed.  Constitutional:      General: He is not in acute distress.    Appearance: He is well-developed.  HENT:     Head: Normocephalic and atraumatic.  Eyes:     General: No scleral icterus.    Conjunctiva/sclera: Conjunctivae normal.     Pupils: Pupils are equal, round, and reactive to light.  Neck:     Vascular: No JVD.     Trachea: No tracheal deviation.  Cardiovascular:     Rate and Rhythm: Normal rate and regular rhythm.     Heart sounds: Normal heart sounds. No murmur heard. Pulmonary:     Effort: Pulmonary effort is normal. No tachypnea, accessory muscle usage or respiratory distress.     Breath sounds: No stridor. Rhonchi present. No wheezing or rales.     Comments: Few scattered rhonchi in the right lower lobe Abdominal:     General: There is no distension.     Palpations: Abdomen is soft.     Tenderness: There is no abdominal tenderness.  Musculoskeletal:        General: No tenderness.     Cervical back: Neck supple.  Lymphadenopathy:     Cervical: No cervical adenopathy.  Skin:    General: Skin is warm and dry.     Capillary Refill: Capillary refill takes less than 2 seconds.     Findings: No rash.  Neurological:     Mental Status: He is alert and oriented to person, place, and time.  Psychiatric:        Behavior: Behavior normal.      Vitals:   12/11/22 1423  BP: 120/70  Pulse: 75  SpO2: 93%  Weight:  169 lb 6.4 oz (76.8 kg)  Height: '5\' 9"'$  (1.753 m)   93% on RA BMI Readings from Last 3 Encounters:  12/11/22 25.02 kg/m  11/17/22 24.84 kg/m  10/30/22 25.31 kg/m   Wt Readings from Last 3 Encounters:  12/11/22 169 lb 6.4 oz (76.8 kg)  11/17/22 168 lb 3.2 oz (76.3 kg)  10/30/22 171 lb 6.4  oz (77.7 kg)     CBC    Component Value Date/Time   WBC 6.8 10/30/2022 1534   RBC 4.40 10/30/2022 1534   HGB 13.6 10/30/2022 1534   HGB 13.6 07/18/2019 1113   HCT 39.5 10/30/2022 1534   HCT 40.8 07/18/2019 1113   PLT 307 10/30/2022 1534   PLT 295 07/18/2019 1113   MCV 89.8 10/30/2022 1534   MCV 92 07/18/2019 1113   MCH 30.9 10/30/2022 1534   MCHC 34.4 10/30/2022 1534   RDW 12.5 10/30/2022 1534   RDW 12.9 07/18/2019 1113   LYMPHSABS 1.5 02/10/2022 0939   LYMPHSABS 2.2 07/18/2019 1113   MONOABS 0.5 02/10/2022 0939   EOSABS 0.1 02/10/2022 0939   EOSABS 0.3 07/18/2019 1113   BASOSABS 0.0 02/10/2022 0939   BASOSABS 0.0 07/18/2019 1113     Chest Imaging: Lung cancer screening CT 08/20/2022: Lung RADS 2, small pulmonary nodule, atherosclerosis. The patient's images have been independently reviewed by me.    Pulmonary Functions Testing Results:    Latest Ref Rng & Units 11/25/2022    9:02 AM  PFT Results  FVC-Pre L 2.49   FVC-Predicted Pre % 61   FVC-Post L 2.64   FVC-Predicted Post % 65   Pre FEV1/FVC % % 68   Post FEV1/FCV % % 74   FEV1-Pre L 1.68   FEV1-Predicted Pre % 57   FEV1-Post L 1.96   DLCO uncorrected ml/min/mmHg 18.11   DLCO UNC% % 74   DLCO corrected ml/min/mmHg 18.66   DLCO COR %Predicted % 77   DLVA Predicted % 93   TLC L 5.50   TLC % Predicted % 80   RV % Predicted % 96     FeNO:   Pathology:   Echocardiogram:   Heart Catheterization:     Assessment & Plan:     ICD-10-CM   1. COPD  GOLD II/ Group A  J44.9 azithromycin (ZITHROMAX) 250 MG tablet    EKG 12-Lead    2. Former smoker  Z87.891     3. Screening for malignant neoplasm of respiratory organ  Z12.2     4. Medication management  Z79.899 azithromycin (ZITHROMAX) 250 MG tablet    EKG 12-Lead      Discussion: This is a 77 year old gentleman with stage II COPD currently on triple therapy inhaler and has been on this for several weeks.  Unfortunately still having ongoing  shortness of breath and sputum production issues.  This seems to be more of a chronic bronchitis problem.  Cultures reviewed from last sputum samples.  He did complete a recent course of doxycycline.  Plan: I think next best steps would be consideration for addition of azithromycin Monday Wednesday Friday. He was counseled on this new medication use. We will get ECG for baseline QTc today in the office. If this is okay we will can start this medication and have a repeat office visit in 6 weeks with a repeat QTc check with ECG in the office. Continue Trelegy inhaler daily Continue albuterol as needed RTC in 6-week with APP for repeat ECG.   Current Outpatient Medications:  albuterol (PROVENTIL) (2.5 MG/3ML) 0.083% nebulizer solution, Take 3 mLs (2.5 mg total) by nebulization every 6 (six) hours as needed for wheezing or shortness of breath., Disp: 75 mL, Rfl: 12   albuterol (VENTOLIN HFA) 108 (90 Base) MCG/ACT inhaler, Inhale 1-2 puffs into the lungs every 6 (six) hours as needed for wheezing or shortness of breath., Disp: 18 g, Rfl: 0   azithromycin (ZITHROMAX) 250 MG tablet, Take 1 tablet (250 mg total) by mouth every Monday, Wednesday, and Friday., Disp: 36 tablet, Rfl: 2   Cholecalciferol (VITAMIN D3 PO), Take by mouth daily. Take one pill daily, Disp: , Rfl:    Eszopiclone 3 MG TABS, Take 1 tablet (3 mg total) by mouth at bedtime. Take immediately before bedtime as needed., Disp: 30 tablet, Rfl: 3   Fluticasone-Umeclidin-Vilant (TRELEGY ELLIPTA) 100-62.5-25 MCG/ACT AEPB, Inhale 60 each into the lungs daily., Disp: 60 each, Rfl: 3   linaclotide (LINZESS) 145 MCG CAPS capsule, Take 1 capsule (145 mcg total) by mouth daily before breakfast., Disp: 90 capsule, Rfl: 1   METAMUCIL FIBER PO, Take by mouth as needed., Disp: , Rfl:    Multiple Vitamin (MULTIVITAMIN ADULT PO), Take by mouth daily. Take tablet daily, Disp: , Rfl:    PREVIDENT 5000 BOOSTER PLUS 1.1 % PSTE, SMARTSIG:Sparingly To  Teeth, Disp: , Rfl:    rosuvastatin (CRESTOR) 10 MG tablet, TAKE 1 TABLET(10 MG) BY MOUTH DAILY, Disp: 90 tablet, Rfl: 3   tamsulosin (FLOMAX) 0.4 MG CAPS capsule, Take 1 capsule (0.4 mg total) by mouth daily., Disp: 90 capsule, Rfl: 3   acyclovir (ZOVIRAX) 200 MG capsule, Take 1 capsule (200 mg total) by mouth 4 (four) times daily. At time of flare-up., Disp: 28 capsule, Rfl: 5   benzonatate (TESSALON) 200 MG capsule, Take 1 capsule (200 mg total) by mouth 2 (two) times daily as needed for cough. (Patient not taking: Reported on 12/11/2022), Disp: 20 capsule, Rfl: 0   cetirizine HCl (ZYRTEC) 1 MG/ML solution, Take 5 mLs (5 mg total) by mouth daily. (Patient not taking: Reported on 12/11/2022), Disp: 300 mL, Rfl: 0   doxycycline (VIBRA-TABS) 100 MG tablet, Take 1 tablet (100 mg total) by mouth 2 (two) times daily. (Patient not taking: Reported on 11/17/2022), Disp: 10 tablet, Rfl: 0   doxycycline (VIBRA-TABS) 100 MG tablet, Take 1 tablet (100 mg total) by mouth 2 (two) times daily. (Patient not taking: Reported on 12/11/2022), Disp: 10 tablet, Rfl: 0   guaiFENesin-codeine (ROBITUSSIN AC) 100-10 MG/5ML syrup, Take 5 mLs by mouth at bedtime as needed for cough. (Patient not taking: Reported on 12/11/2022), Disp: 120 mL, Rfl: 1   predniSONE (DELTASONE) 20 MG tablet, Take 2 tablets (40 mg total) by mouth daily with breakfast. (Patient not taking: Reported on 11/17/2022), Disp: 10 tablet, Rfl: 0   Garner Nash, DO [facility] 12/11/2022 2:36 PM

## 2022-12-11 NOTE — Patient Instructions (Signed)
Thank you for visiting Dr. Valeta Harms at [facility]. Today we recommend the following:  Orders Placed This Encounter  Procedures   EKG 12-Lead   Meds ordered this encounter  Medications   azithromycin (ZITHROMAX) 250 MG tablet    Sig: Take 1 tablet (250 mg total) by mouth every Monday, Wednesday, and Friday.    Dispense:  36 tablet    Refill:  2    Return in about 6 weeks (around 01/22/2023) for with APP. W/ repeat ECG     Please do your part to reduce the spread of COVID-19.

## 2022-12-14 ENCOUNTER — Ambulatory Visit: Payer: Medicare Other | Admitting: Pulmonary Disease

## 2022-12-16 ENCOUNTER — Encounter: Payer: Self-pay | Admitting: Pulmonary Disease

## 2022-12-16 MED ORDER — ALBUTEROL SULFATE HFA 108 (90 BASE) MCG/ACT IN AERS: 1.0000 | INHALATION_SPRAY | Freq: Four times a day (QID) | RESPIRATORY_TRACT | 1 refills | Status: DC | PRN

## 2022-12-29 LAB — AFB CULTURE WITH SMEAR (NOT AT ARMC)
Acid Fast Culture: NEGATIVE
Acid Fast Smear: NEGATIVE

## 2023-01-20 ENCOUNTER — Ambulatory Visit: Payer: Medicare Other | Admitting: Pulmonary Disease

## 2023-01-25 ENCOUNTER — Ambulatory Visit (INDEPENDENT_AMBULATORY_CARE_PROVIDER_SITE_OTHER): Payer: Medicare Other | Admitting: Nurse Practitioner

## 2023-01-25 ENCOUNTER — Telehealth: Payer: Self-pay

## 2023-01-25 ENCOUNTER — Encounter: Payer: Self-pay | Admitting: Nurse Practitioner

## 2023-01-25 VITALS — BP 122/70 | HR 69 | Temp 98.0°F | Ht 69.0 in | Wt 169.8 lb

## 2023-01-25 DIAGNOSIS — Z79899 Other long term (current) drug therapy: Secondary | ICD-10-CM

## 2023-01-25 DIAGNOSIS — J449 Chronic obstructive pulmonary disease, unspecified: Secondary | ICD-10-CM

## 2023-01-25 DIAGNOSIS — R399 Unspecified symptoms and signs involving the genitourinary system: Secondary | ICD-10-CM

## 2023-01-25 MED ORDER — ALBUTEROL SULFATE (2.5 MG/3ML) 0.083% IN NEBU: 2.5000 mg | INHALATION_SOLUTION | Freq: Four times a day (QID) | RESPIRATORY_TRACT | 12 refills | Status: DC | PRN

## 2023-01-25 NOTE — Telephone Encounter (Signed)
Pt needs to come back into office for EKG. No appointment needed

## 2023-01-25 NOTE — Assessment & Plan Note (Signed)
Moderate COPD. Compensated on current regimen with resolution of chronic bronchitis symptoms. He needs EKG for QTc monitoring. Machine was malfunctioning during his visit. He is going to return to have this done later this week. Action plan in place.   Patient Instructions  Continue Albuterol inhaler 2 puffs or 3 mL neb every 6 hours as needed for shortness of breath or wheezing. Notify if symptoms persist despite rescue inhaler/neb use. Continue Trelegy 1 puff daily. Brush tongue and rinse mouth afterwards Continue azithromycin 1 tab on Mondays/Wednesdays/Fridays   Follow up with Kevin Yisell Sprunger,NP in 3 months with repeat EKG then Dr. Tonia Shepard in 6 months. If symptoms do not improve or worsen, please contact office for sooner follow up or seek emergency care.

## 2023-01-25 NOTE — Progress Notes (Unsigned)
  ID: Kevin Shepard, male    DOB: 1946/06/06, 77 y.o.   MRN: [id number]  Chief Complaint  Patient presents with   Follow-up    How long should he be on antibiotics, pt states he would be taking them for a year. Pt does feel better.     Referring provider: Georgina Quint, *  HPI: 77 year old male, former smoker followed for COPD with chronic bronchitis. He is a patient of Dr. Myrlene Broker and last seen in office 12/11/2022. He is followed by the lung cancer screening program. Past medical history significant for HLD, insomnia, chronic constipation, BPH.  TEST/EVENTS:  08/20/2022 LDCT chest: atherosclerosis. Nonenlarged coarsely calcified subcarinal and right hilar nodes, unchanged and compatible with prior granulomatous disease. Mild centrilobular emphysema and diffuse bronchial wall thickening. 4.3 RML nodule has resolved. Lung RADS 2. 11/11/2022 sputum culture positive for moraxella cattarrhalis, AFB neg 11/25/2022 PFT: FVC 61, FEV1 57, ratio 74, TLC 80, DLCOcor 77. Positive BD (16%)  12/11/2022: OV with Dr. Tonia Brooms. He had re-established care back in January 2024. Chronic cough for the past 3 months.  Started on Trelegy 1 puff daily and as needed albuterol.  He then was seen again in February 2024.  Was awaiting culture for AFB.  Had positive sputum culture with Moraxella catarrhalis.  AFB ended up being negative.  He also had pulmonary function testing completed which showed FEV1 of 67% and positive bronchodilator response.  Presented for follow-up.  Still having daily cough and sputum production, despite doxycycline course.  Seems to be more of a chronic bronchitis problem.  Advised starting azithromycin on Monday Wednesday Friday.  Plan for repeat EKG in 6 weeks.  01/25/2023: Today - follow up Patient presents today for follow up after being started on chronic macrolide therapy with azithromycin M/W/F. He tells me he is feeling much better. He has not noticed any more wheezing and his  breathing feels pretty good. He's able to exercise again and walk long distances without trouble. His cough is minimal; usually only when he gets a dry throat. He's producing very little phlegm. No concerns or complaints today. Just curious what he should do with his neb treatments. He is using his Trelegy daily.   No Known Allergies  Immunization History  Administered Date(s) Administered   Fluad Quad(high Dose 65+) 07/25/2020, 09/15/2022   Influenza, High Dose Seasonal PF 06/07/2018, 07/03/2019   Influenza-Unspecified 07/05/2017, 07/13/2021   PFIZER(Purple Top)SARS-COV-2 Vaccination 11/14/2019, 12/10/2019, 08/02/2020   Pfizer Covid-19 Vaccine Bivalent Booster 95yrs & up 08/24/2022   Pneumococcal Conjugate-13 06/27/2014   Pneumococcal Polysaccharide-23 02/05/2006, 05/15/2013   Rsv, Mab, Judi Cong, 0.5 Ml, Neonate To 24 Mos(Beyfortus) 08/24/2022   Tdap 08/16/2007   Zoster Recombinat (Shingrix) 08/24/2022    Past Medical History:  Diagnosis Date   Blood transfusion without reported diagnosis    Cataract    beginning   Colon polyps    COPD (chronic obstructive pulmonary disease)    Hyperlipidemia     Tobacco History: Social History   Tobacco Use  Smoking Status Former   Packs/day: 1.00   Years: 40.00   Additional pack years: 0.00   Total pack years: 40.00   Types: Cigarettes   Quit date: 10/04/2017   Years since quitting: 5.3   Passive exposure: Past  Smokeless Tobacco Never   Counseling given: Not Answered   Outpatient Medications Prior to Visit  Medication Sig Dispense Refill   albuterol (VENTOLIN HFA) 108 (90 Base) MCG/ACT inhaler Inhale 1-2 puffs into the  lungs every 6 (six) hours as needed for wheezing or shortness of breath. 18 g 1   azithromycin (ZITHROMAX) 250 MG tablet Take 1 tablet (250 mg total) by mouth every Monday, Wednesday, and Friday. 36 tablet 2   Cholecalciferol (VITAMIN D3 PO) Take by mouth daily. Take one pill daily     doxycycline  (VIBRA-TABS) 100 MG tablet Take 1 tablet (100 mg total) by mouth 2 (two) times daily. 10 tablet 0   Eszopiclone 3 MG TABS Take 1 tablet (3 mg total) by mouth at bedtime. Take immediately before bedtime as needed. 30 tablet 3   Fluticasone-Umeclidin-Vilant (TRELEGY ELLIPTA) 100-62.5-25 MCG/ACT AEPB Inhale 60 each into the lungs daily. 60 each 3   linaclotide (LINZESS) 145 MCG CAPS capsule Take 1 capsule (145 mcg total) by mouth daily before breakfast. 90 capsule 1   METAMUCIL FIBER PO Take by mouth as needed.     Multiple Vitamin (MULTIVITAMIN ADULT PO) Take by mouth daily. Take tablet daily     PREVIDENT 5000 BOOSTER PLUS 1.1 % PSTE SMARTSIG:Sparingly To Teeth     rosuvastatin (CRESTOR) 10 MG tablet TAKE 1 TABLET(10 MG) BY MOUTH DAILY 90 tablet 3   tamsulosin (FLOMAX) 0.4 MG CAPS capsule Take 1 capsule (0.4 mg total) by mouth daily. 90 capsule 3   albuterol (PROVENTIL) (2.5 MG/3ML) 0.083% nebulizer solution Take 3 mLs (2.5 mg total) by nebulization every 6 (six) hours as needed for wheezing or shortness of breath. 75 mL 12   acyclovir (ZOVIRAX) 200 MG capsule Take 1 capsule (200 mg total) by mouth 4 (four) times daily. At time of flare-up. 28 capsule 5   benzonatate (TESSALON) 200 MG capsule Take 1 capsule (200 mg total) by mouth 2 (two) times daily as needed for cough. (Patient not taking: Reported on 12/11/2022) 20 capsule 0   cetirizine HCl (ZYRTEC) 1 MG/ML solution Take 5 mLs (5 mg total) by mouth daily. (Patient not taking: Reported on 12/11/2022) 300 mL 0   doxycycline (VIBRA-TABS) 100 MG tablet Take 1 tablet (100 mg total) by mouth 2 (two) times daily. 10 tablet 0   guaiFENesin-codeine (ROBITUSSIN AC) 100-10 MG/5ML syrup Take 5 mLs by mouth at bedtime as needed for cough. (Patient not taking: Reported on 12/11/2022) 120 mL 1   predniSONE (DELTASONE) 20 MG tablet Take 2 tablets (40 mg total) by mouth daily with breakfast. (Patient not taking: Reported on 11/17/2022) 10 tablet 0   No  facility-administered medications prior to visit.     Review of Systems:   Constitutional: No weight loss or gain, night sweats, fevers, chills, fatigue, or lassitude. HEENT: No headaches, difficulty swallowing, tooth/dental problems, or sore throat. No sneezing, itching, ear ache, nasal congestion, or post nasal drip CV:  No chest pain, orthopnea, PND, swelling in lower extremities, anasarca, dizziness, palpitations, syncope Resp: +shortness of breath with exertion (improved); minimal cough. No excess mucus or change in color of mucus. No hemoptysis. No wheezing.  No chest wall deformity GI:  No heartburn, indigestion, abdominal pain, nausea, vomiting, diarrhea, change in bowel habits, loss of appetite, bloody stools.  GU: No dysuria, change in color of urine, urgency or frequency.   Skin: No rash, lesions, ulcerations MSK:  No joint pain or swelling.   Neuro: No dizziness or lightheadedness.  Psych: No depression or anxiety. Mood stable.     Physical Exam:  BP 122/70   Pulse 69   Temp 98 F (36.7 C) (Oral)   Ht 5\' 9"  (1.753 m)   Wt  169 lb 12.8 oz (77 kg)   SpO2 97%   BMI 25.08 kg/m   GEN: Pleasant, interactive, well-appearing; in no acute distress HEENT:  Normocephalic and atraumatic. PERRLA. Sclera white. Nasal turbinates pink, moist and patent bilaterally. No rhinorrhea present. Oropharynx pink and moist, without exudate or edema. No lesions, ulcerations, or postnasal drip.  NECK:  Supple w/ fair ROM. No JVD present. Normal carotid impulses w/o bruits. Thyroid symmetrical with no goiter or nodules palpated. No lymphadenopathy.   CV: RRR, no m/r/g, no peripheral edema. Pulses intact, +2 bilaterally. No cyanosis, pallor or clubbing. PULMONARY:  Unlabored, regular breathing. Clear bilaterally A&P w/o wheezes/rales/rhonchi. No accessory muscle use.  GI: BS present and normoactive. Soft, non-tender to palpation. No organomegaly or masses detected. MSK: No erythema, warmth or  tenderness. Cap refil <2 sec all extrem. No deformities or joint swelling noted.  Neuro: A/Ox3. No focal deficits noted.   Skin: Warm, no lesions or rashe Psych: Normal affect and behavior. Judgement and thought content appropriate.     Lab Results:  CBC    Component Value Date/Time   WBC 6.8 10/30/2022 1534   RBC 4.40 10/30/2022 1534   HGB 13.6 10/30/2022 1534   HGB 13.6 07/18/2019 1113   HCT 39.5 10/30/2022 1534   HCT 40.8 07/18/2019 1113   PLT 307 10/30/2022 1534   PLT 295 07/18/2019 1113   MCV 89.8 10/30/2022 1534   MCV 92 07/18/2019 1113   MCH 30.9 10/30/2022 1534   MCHC 34.4 10/30/2022 1534   RDW 12.5 10/30/2022 1534   RDW 12.9 07/18/2019 1113   LYMPHSABS 1.5 02/10/2022 0939   LYMPHSABS 2.2 07/18/2019 1113   MONOABS 0.5 02/10/2022 0939   EOSABS 0.1 02/10/2022 0939   EOSABS 0.3 07/18/2019 1113   BASOSABS 0.0 02/10/2022 0939   BASOSABS 0.0 07/18/2019 1113    BMET    Component Value Date/Time   NA 138 02/10/2022 0939   NA 140 07/31/2020 1020   K 4.5 02/10/2022 0939   CL 101 02/10/2022 0939   CO2 29 02/10/2022 0939   GLUCOSE 83 02/10/2022 0939   BUN 15 02/10/2022 0939   BUN 12 07/31/2020 1020   CREATININE 1.02 02/10/2022 0939   CALCIUM 9.2 02/10/2022 0939   GFRNONAA 73 07/31/2020 1020   GFRAA 85 07/31/2020 1020    BNP No results found for: "BNP"   Imaging:  No results found.       Latest Ref Rng & Units 11/25/2022    9:02 AM  PFT Results  FVC-Pre L 2.49   FVC-Predicted Pre % 61   FVC-Post L 2.64   FVC-Predicted Post % 65   Pre FEV1/FVC % % 68   Post FEV1/FCV % % 74   FEV1-Pre L 1.68   FEV1-Predicted Pre % 57   FEV1-Post L 1.96   DLCO uncorrected ml/min/mmHg 18.11   DLCO UNC% % 74   DLCO corrected ml/min/mmHg 18.66   DLCO COR %Predicted % 77   DLVA Predicted % 93   TLC L 5.50   TLC % Predicted % 80   RV % Predicted % 96     No results found for: "NITRICOXIDE"      Assessment & Plan:   COPD  GOLD II/ Group A Moderate COPD.  Compensated on current regimen with resolution of chronic bronchitis symptoms. He needs EKG for QTc monitoring. Machine was malfunctioning during his visit. He is going to return to have this done later this week. Action plan in place.  Patient Instructions  Continue Albuterol inhaler 2 puffs or 3 mL neb every 6 hours as needed for shortness of breath or wheezing. Notify if symptoms persist despite rescue inhaler/neb use. Continue Trelegy 1 puff daily. Brush tongue and rinse mouth afterwards Continue azithromycin 1 tab on Mondays/Wednesdays/Fridays   Follow up with Katie Kenlynn Houde,NP in 3 months with repeat EKG then Dr. Tonia Brooms in 6 months. If symptoms do not improve or worsen, please contact office for sooner follow up or seek emergency care.    I spent 28 minutes of dedicated to the care of this patient on the date of this encounter to include pre-visit review of records, face-to-face time with the patient discussing conditions above, post visit ordering of testing, clinical documentation with the electronic health record, making appropriate referrals as documented, and communicating necessary findings to members of the patients care team.  Noemi Chapel, NP 01/25/2023  Pt aware and understands NP's role.

## 2023-01-25 NOTE — Patient Instructions (Addendum)
Continue Albuterol inhaler 2 puffs or 3 mL neb every 6 hours as needed for shortness of breath or wheezing. Notify if symptoms persist despite rescue inhaler/neb use. Continue Trelegy 1 puff daily. Brush tongue and rinse mouth afterwards Continue azithromycin 1 tab on Mondays/Wednesdays/Fridays   Follow up with Katie Alesi Zachery,NP in 3 months with repeat EKG then Dr. Tonia Brooms in 6 months. If symptoms do not improve or worsen, please contact office for sooner follow up or seek emergency care.

## 2023-01-27 ENCOUNTER — Encounter: Payer: Self-pay | Admitting: Nurse Practitioner

## 2023-01-27 ENCOUNTER — Other Ambulatory Visit: Payer: Self-pay | Admitting: *Deleted

## 2023-01-27 DIAGNOSIS — Z79899 Other long term (current) drug therapy: Secondary | ICD-10-CM

## 2023-01-27 NOTE — Progress Notes (Signed)
Addendum 01/27/2023: Pt returned to office for EKG for medication monitoring (chronic macrolide therapy) with QTc 395, stable from previous.

## 2023-02-22 ENCOUNTER — Other Ambulatory Visit: Payer: Self-pay | Admitting: Emergency Medicine

## 2023-02-22 DIAGNOSIS — I700 Atherosclerosis of aorta: Secondary | ICD-10-CM

## 2023-02-22 DIAGNOSIS — E785 Hyperlipidemia, unspecified: Secondary | ICD-10-CM

## 2023-02-23 ENCOUNTER — Other Ambulatory Visit: Payer: Self-pay | Admitting: Pulmonary Disease

## 2023-03-16 ENCOUNTER — Ambulatory Visit: Payer: Medicare Other | Admitting: Emergency Medicine

## 2023-03-31 ENCOUNTER — Encounter: Payer: Self-pay | Admitting: Emergency Medicine

## 2023-03-31 ENCOUNTER — Ambulatory Visit (INDEPENDENT_AMBULATORY_CARE_PROVIDER_SITE_OTHER): Payer: Medicare Other | Admitting: Emergency Medicine

## 2023-03-31 VITALS — BP 102/66 | HR 64 | Temp 98.4°F | Ht 69.0 in | Wt 170.4 lb

## 2023-03-31 DIAGNOSIS — M545 Low back pain, unspecified: Secondary | ICD-10-CM

## 2023-03-31 DIAGNOSIS — R21 Rash and other nonspecific skin eruption: Secondary | ICD-10-CM

## 2023-03-31 DIAGNOSIS — E785 Hyperlipidemia, unspecified: Secondary | ICD-10-CM

## 2023-03-31 DIAGNOSIS — I2510 Atherosclerotic heart disease of native coronary artery without angina pectoris: Secondary | ICD-10-CM

## 2023-03-31 DIAGNOSIS — J449 Chronic obstructive pulmonary disease, unspecified: Secondary | ICD-10-CM | POA: Diagnosis not present

## 2023-03-31 DIAGNOSIS — K5909 Other constipation: Secondary | ICD-10-CM | POA: Diagnosis not present

## 2023-03-31 DIAGNOSIS — R399 Unspecified symptoms and signs involving the genitourinary system: Secondary | ICD-10-CM

## 2023-03-31 DIAGNOSIS — I700 Atherosclerosis of aorta: Secondary | ICD-10-CM | POA: Diagnosis not present

## 2023-03-31 DIAGNOSIS — G8929 Other chronic pain: Secondary | ICD-10-CM

## 2023-03-31 DIAGNOSIS — Z9889 Other specified postprocedural states: Secondary | ICD-10-CM | POA: Diagnosis not present

## 2023-03-31 DIAGNOSIS — G4700 Insomnia, unspecified: Secondary | ICD-10-CM | POA: Diagnosis not present

## 2023-03-31 LAB — COMPREHENSIVE METABOLIC PANEL
ALT: 19 U/L (ref 0–53)
AST: 19 U/L (ref 0–37)
Albumin: 4.5 g/dL (ref 3.5–5.2)
Alkaline Phosphatase: 40 U/L (ref 39–117)
BUN: 15 mg/dL (ref 6–23)
CO2: 28 mEq/L (ref 19–32)
Calcium: 9.7 mg/dL (ref 8.4–10.5)
Chloride: 102 mEq/L (ref 96–112)
Creatinine, Ser: 1.06 mg/dL (ref 0.40–1.50)
GFR: 68.15 mL/min (ref >60.00)
Glucose, Bld: 94 mg/dL (ref 70–99)
Potassium: 4.5 mEq/L (ref 3.5–5.1)
Sodium: 137 mEq/L (ref 135–145)
Total Bilirubin: 0.8 mg/dL (ref 0.2–1.2)
Total Protein: 7.4 g/dL (ref 6.0–8.3)

## 2023-03-31 LAB — CBC WITH DIFFERENTIAL/PLATELET
Basophils Absolute: 0 10*3/uL (ref 0.0–0.1)
Basophils Relative: 0.6 % (ref 0.0–3.0)
Eosinophils Absolute: 0.3 10*3/uL (ref 0.0–0.7)
Eosinophils Relative: 5.1 % — ABNORMAL HIGH (ref 0.0–5.0)
HCT: 41.3 % (ref 39.0–52.0)
Hemoglobin: 13.9 g/dL (ref 13.0–17.0)
Lymphocytes Relative: 30.5 % (ref 12.0–46.0)
Lymphs Abs: 1.5 10*3/uL (ref 0.7–4.0)
MCHC: 33.6 g/dL (ref 30.0–36.0)
MCV: 91 fl (ref 78.0–100.0)
Monocytes Absolute: 0.5 10*3/uL (ref 0.1–1.0)
Monocytes Relative: 9.4 % (ref 3.0–12.0)
Neutro Abs: 2.7 10*3/uL (ref 1.4–7.7)
Neutrophils Relative %: 54.4 % (ref 43.0–77.0)
Platelets: 271 10*3/uL (ref 150.0–400.0)
RBC: 4.54 Mil/uL (ref 4.22–5.81)
RDW: 14.1 % (ref 11.5–15.5)
WBC: 4.9 10*3/uL (ref 4.0–10.5)

## 2023-03-31 LAB — LIPID PANEL
Cholesterol: 158 mg/dL (ref 0–200)
HDL: 62 mg/dL (ref >39.00)
LDL Cholesterol: 83 mg/dL (ref 0–99)
NonHDL: 96.37
Total CHOL/HDL Ratio: 3
Triglycerides: 69 mg/dL (ref 0.0–149.0)
VLDL: 13.8 mg/dL (ref 0.0–40.0)

## 2023-03-31 MED ORDER — CLOBETASOL PROPIONATE 0.05 % EX CREA: 1.0000 | TOPICAL_CREAM | Freq: Two times a day (BID) | CUTANEOUS | 0 refills | Status: AC

## 2023-03-31 MED ORDER — ESZOPICLONE 3 MG PO TABS: 3.0000 mg | ORAL_TABLET | Freq: Every day | ORAL | 3 refills | Status: DC

## 2023-03-31 NOTE — Assessment & Plan Note (Signed)
Well-controlled with prune juice Linzess caused severe diarrhea

## 2023-03-31 NOTE — Progress Notes (Signed)
Kevin Shepard 77 y.o.   Chief Complaint  Patient presents with   Medical Management of Chronic Issues    6 month follow up, rash breakout around the neck and right side of his back pain seems to get worse     HISTORY OF PRESENT ILLNESS: This is a 77 y.o. male here for 72-month follow-up of chronic medical conditions Also complaining of neck rash that came out about 2 days ago Otherwise doing well.  Stable. No other complaints or medical concerns today.  HPI   Prior to Admission medications   Medication Sig Start Date End Date Taking? Authorizing Provider  albuterol (PROVENTIL) (2.5 MG/3ML) 0.083% nebulizer solution Take 3 mLs (2.5 mg total) by nebulization every 6 (six) hours as needed for wheezing or shortness of breath. 01/25/23  Yes Cobb, Ruby Cola, NP  albuterol (VENTOLIN HFA) 108 (90 Base) MCG/ACT inhaler Inhale 1-2 puffs into the lungs every 6 (six) hours as needed for wheezing or shortness of breath. 12/16/22  Yes Icard, Rachel Bo, DO  azithromycin (ZITHROMAX) 250 MG tablet Take 1 tablet (250 mg total) by mouth every Monday, Wednesday, and Friday. 12/11/22  Yes Icard, Rachel Bo, DO  Cholecalciferol (VITAMIN D3 PO) Take by mouth daily. Take one pill daily   Yes [provider]  clobetasol cream (TEMOVATE) 0.05 % Apply 1 Application topically 2 (two) times daily. 03/31/23  Yes Charmika Macdonnell, Eilleen Kempf, MD  METAMUCIL FIBER PO Take by mouth as needed.   Yes [provider]  Multiple Vitamin (MULTIVITAMIN ADULT PO) Take by mouth daily. Take tablet daily   Yes [provider]  PREVIDENT 5000 BOOSTER PLUS 1.1 % PSTE SMARTSIG:Sparingly To Teeth 05/14/21  Yes [provider]  rosuvastatin (CRESTOR) 10 MG tablet TAKE 1 TABLET(10 MG) BY MOUTH DAILY 02/22/23  Yes Ceanna Wareing, Eilleen Kempf, MD  tamsulosin (FLOMAX) 0.4 MG CAPS capsule Take 1 capsule (0.4 mg total) by mouth daily. 09/15/22  Yes Joyanne Eddinger, Eilleen Kempf, MD  TRELEGY ELLIPTA 100-62.5-25 MCG/ACT AEPB INHALE 1  PUFF INTO THE LUNGS EVERY DAY 02/23/23  Yes Cobb, Ruby Cola, NP  Eszopiclone 3 MG TABS Take 1 tablet (3 mg total) by mouth at bedtime. Take immediately before bedtime as needed. 03/31/23   Georgina Quint, MD    No Known Allergies  Patient Active Problem List   Diagnosis Date Noted   Chronic constipation 02/10/2022   Lower urinary tract symptoms 02/10/2022   Dyslipidemia 01/29/2021   Atherosclerosis of aorta (HCC) 01/29/2021   Insomnia 01/29/2021   Atherosclerosis of native coronary artery of native heart without angina pectoris 01/29/2021   Rash and nonspecific skin eruption 10/17/2020   Seborrheic keratoses 04/11/2020   Actinic keratoses 04/11/2020   COPD  GOLD II/ Group A 06/02/2018   Left upper lobe pulmonary nodule 06/02/2018   Former smoker 04/06/2018   Alcohol use 01/10/2018   Chronic bilateral low back pain without sciatica 01/10/2018   History of tobacco use 01/10/2018   History of back surgery 01/10/2018    Past Medical History:  Diagnosis Date   Blood transfusion without reported diagnosis    Cataract    beginning   Colon polyps    COPD (chronic obstructive pulmonary disease) (HCC)    Hyperlipidemia     Past Surgical History:  Procedure Laterality Date   APPENDECTOMY     per patient 1950's   COLONOSCOPY     Last 02/19/2017 [facility] an 8 mm polyps rectum and hepatic flexure with history of polyps prior  FRACTURE SURGERY  1974   right clavicle   LUMBAR SPINE SURGERY  2018   SPINE SURGERY  10/2016   TONSILLECTOMY      Social History   Socioeconomic History   Marital status: Married    Spouse name: Not on file   Number of children: Not on file   Years of education: Not on file   Highest education level: Not on file  Occupational History   Occupation: stock market trading  Tobacco Use   Smoking status: Former    Packs/day: 1.00    Years: 40.00    Additional pack years: 0.00    Total pack years: 40.00    Types:  Cigarettes    Quit date: 10/04/2017    Years since quitting: 5.4    Passive exposure: Past   Smokeless tobacco: Never  Vaping Use   Vaping Use: Never used  Substance and Sexual Activity   Alcohol use: Yes    Comment: 2-3 days/week   Drug use: Never   Sexual activity: Yes    Partners: Female  Other Topics Concern   Not on file  Social History Narrative   Not on file   Social Determinants of Health   Financial Resource Strain: Low Risk  (05/15/2022)   Overall Financial Resource Strain (CARDIA)    Difficulty of Paying Living Expenses: Not hard at all  Food Insecurity: No Food Insecurity (05/15/2022)   Hunger Vital Sign    Worried About Running Out of Food in the Last Year: Never true    Ran Out of Food in the Last Year: Never true  Transportation Needs: No Transportation Needs (05/15/2022)   PRAPARE - Administrator, Civil Service (Medical): No    Lack of Transportation (Non-Medical): No  Physical Activity: Sufficiently Active (05/15/2022)   Exercise Vital Sign    Days of Exercise per Week: 5 days    Minutes of Exercise per Session: 40 min  Stress: No Stress Concern Present (05/15/2022)   [employer] of Occupational Health - Occupational Stress Questionnaire    Feeling of Stress : Not at all  Social Connections: Moderately Isolated (05/15/2022)   Social Connection and Isolation Panel [NHANES]    Frequency of Communication with Friends and Family: Three times a week    Frequency of Social Gatherings with Friends and Family: Three times a week    Attends Religious Services: Never    Active Member of Clubs or Organizations: No    Attends Banker Meetings: Never    Marital Status: Married  Catering manager Violence: Not At Risk (05/15/2022)   Humiliation, Afraid, Rape, and Kick questionnaire    Fear of Current or Ex-Partner: No    Emotionally Abused: No    Physically Abused: No    Sexually Abused: No    Family History  Problem Relation Age of  Onset   Cancer Mother        Breast   Early death Father    Heart disease Father 80       rheumatic heart disease   Hyperlipidemia Sister    Colon cancer Neg Hx    Colon polyps Neg Hx    Crohn's disease Neg Hx    Esophageal cancer Neg Hx    Rectal cancer Neg Hx    Stomach cancer Neg Hx    Ulcerative colitis Neg Hx      Review of Systems  Constitutional: Negative.  Negative for chills and fever.  HENT: Negative.  Negative  for congestion and sore throat.   Respiratory: Negative.  Negative for cough and shortness of breath.   Cardiovascular: Negative.  Negative for chest pain and palpitations.  Gastrointestinal:  Negative for abdominal pain, diarrhea, nausea and vomiting.  Genitourinary: Negative.  Negative for dysuria and hematuria.  Skin:  Positive for itching and rash.  Neurological: Negative.  Negative for dizziness and headaches.  All other systems reviewed and are negative.   Vitals:   03/31/23 0839  BP: 102/66  Pulse: 64  Temp: 98.4 F (36.9 C)  SpO2: 94%    Physical Exam Vitals reviewed.  Constitutional:      Appearance: Normal appearance.  HENT:     Head: Normocephalic.     Mouth/Throat:     Mouth: Mucous membranes are moist.     Pharynx: Oropharynx is clear.  Eyes:     Extraocular Movements: Extraocular movements intact.     Conjunctiva/sclera: Conjunctivae normal.     Pupils: Pupils are equal, round, and reactive to light.  Cardiovascular:     Rate and Rhythm: Normal rate and regular rhythm.     Pulses: Normal pulses.     Heart sounds: Normal heart sounds.  Pulmonary:     Effort: Pulmonary effort is normal.     Breath sounds: Normal breath sounds.  Musculoskeletal:     Cervical back: No tenderness.  Lymphadenopathy:     Cervical: No cervical adenopathy.  Skin:    General: Skin is warm and dry.     Capillary Refill: Capillary refill takes less than 2 seconds.     Findings: Rash present.     Comments: Erythematous rash localized to neck, mostly  right-sided No signs of shingles  Neurological:     General: No focal deficit present.     Mental Status: He is alert and oriented to person, place, and time.  Psychiatric:        Mood and Affect: Mood normal.        Behavior: Behavior normal.      ASSESSMENT & PLAN: A total of 44 minutes was spent with the patient and counseling/coordination of care regarding preparing for this visit, review of most recent office visit notes, review of multiple chronic medical conditions under management, review of all medications, review of health maintenance items, review of most recent blood work results, education on nutrition, prognosis, documentation, and need for follow-up.  Problem List Items Addressed This Visit       Cardiovascular and Mediastinum   Atherosclerosis of aorta (HCC)    Diet and nutrition discussed Continue rosuvastatin 10 mg daily      Relevant Orders   Comprehensive metabolic panel   Lipid panel   Atherosclerosis of native coronary artery of native heart without angina pectoris    No recent anginal episodes Stable chronic condition        Respiratory   COPD  GOLD II/ Group A    Chronic stable condition Sees pulmonary on a regular basis Continues daily Trelegy and albuterol as needed Also taking azithromycin 3 times a week Recent bout of congestion      Relevant Orders   CBC with Differential/Platelet     Digestive   Chronic constipation    Well-controlled with prune juice Linzess caused severe diarrhea        Musculoskeletal and Integument   Rash and nonspecific skin eruption - Primary    Most likely contact dermatitis Recommend topical corticosteroid for several days Unknown trigger      Relevant  Medications   clobetasol cream (TEMOVATE) 0.05 %   Other Relevant Orders   CBC with Differential/Platelet     Other   Chronic bilateral low back pain without sciatica    Stable and well-controlled Exercises regularly Ibuprofen helps Pain worse in  the morning better about 1 hour later      History of back surgery    Occasional low back pain mostly in the morning Relieved then about an hour Exercise regularly Well-controlled      Dyslipidemia    Diet and nutrition discussed Continue rosuvastatin 10 mg daily Lipid profile done today      Relevant Orders   CBC with Differential/Platelet   Comprehensive metabolic panel   Lipid panel   Insomnia    Eszopiclone 3 mg at bedtime working for him      Relevant Medications   Eszopiclone 3 MG TABS   Lower urinary tract symptoms    Well-controlled with tamsulosin 0.4 mg at bedtime      Patient Instructions  Contact Dermatitis Dermatitis is when your skin becomes red, sore, and swollen.  Contact dermatitis happens when your body reacts to something that touches the skin. There are 2 types: Irritant contact dermatitis. This is when something bothers your skin, like soap. Allergic contact dermatitis. This is when your skin touches something you are allergic to, like poison ivy. What are the causes? Irritant contact dermatitis may be caused by: Makeup. Soaps. Detergents. Bleaches. Acids. Metals, like nickel. Allergic contact dermatitis may be caused by: Plants. Chemicals. Jewelry. Latex. Medicines. Preservatives. These are things added to products to help them last longer. There may be some in your clothes. What increases the risk? Having a job where you have to be near things that bother your skin. Having asthma or eczema. What are the signs or symptoms?  Dry or flaky skin. Redness. Cracks. Itching. Moderate symptoms of this condition include: Pain or a burning feeling. Blisters. Blood or clear fluid coming from cracks in your skin. Swelling. This may be on your eyelids, mouth, or genitals. How is this treated? Your doctor will find out what is making your skin react. Then, you can protect your skin. You may need to use: Steroid creams, ointments, or  medicines. Antibiotics or other ointments, if you have a skin infection. Lotion or medicines to help with itching. A bandage. Follow these instructions at home: Skin care Put moisturizer on your skin when it needs it. Put cool, wet cloths on your skin (cool compresses). Put a baking soda paste on your skin. Stir water into baking soda until it looks like a paste. Do not scratch your skin. Try not to have things rub up against your skin. Avoid tight clothing. Avoid using soaps, perfumes, and dyes. Check your skin every day for signs of infection. Check for: More redness, swelling, or pain. More fluid or blood. Warmth. Pus or a bad smell. Medicines Take or apply over-the-counter and prescription medicines only as told by your doctor. If you were prescribed antibiotics, take or apply them as told by your doctor. Do not stop using them even if you start to feel better. Bathing Take a bath with: Epsom salts. Baking soda. Colloidal oatmeal. Bathe less often. Bathe in warm water. Try not to use hot water. Bandage care If you were given a bandage, change it as told by your doctor. Wash your hands with soap and water for at least 20 seconds before and after you change your bandage. If you cannot use soap  and water, use hand sanitizer. General instructions Avoid the things that caused your reaction. If you don't know what caused it, keep a journal. Write down: What you eat. What skin products you use. What you drink. What you wear. Contact a doctor if: You do not get better with treatment. You get worse. You have signs of infection. You have a fever. You have new symptoms. Your bone or joint near the area hurts after the skin has healed. Get help right away if: You see red streaks coming from the area. The area turns darker. You have trouble breathing. This information is not intended to replace advice given to you by your health care provider. Make sure you discuss any questions  you have with your health care provider. Document Revised: 03/27/2022 Document Reviewed: 03/27/2022 Elsevier Patient Education  2024 Elsevier Inc.     Edwina Barth, MD [facility]

## 2023-03-31 NOTE — Assessment & Plan Note (Signed)
Most likely contact dermatitis Recommend topical corticosteroid for several days Unknown trigger

## 2023-03-31 NOTE — Assessment & Plan Note (Signed)
Eszopiclone 3 mg at bedtime working for him

## 2023-03-31 NOTE — Assessment & Plan Note (Signed)
No recent anginal episodes Stable chronic condition

## 2023-03-31 NOTE — Assessment & Plan Note (Signed)
Diet and nutrition discussed. Continue rosuvastatin 10 mg daily.  

## 2023-03-31 NOTE — Patient Instructions (Signed)
Contact Dermatitis Dermatitis is when your skin becomes red, sore, and swollen.  Contact dermatitis happens when your body reacts to something that touches the skin. There are 2 types: Irritant contact dermatitis. This is when something bothers your skin, like soap. Allergic contact dermatitis. This is when your skin touches something you are allergic to, like poison ivy. What are the causes? Irritant contact dermatitis may be caused by: Makeup. Soaps. Detergents. Bleaches. Acids. Metals, like nickel. Allergic contact dermatitis may be caused by: Plants. Chemicals. Jewelry. Latex. Medicines. Preservatives. These are things added to products to help them last longer. There may be some in your clothes. What increases the risk? Having a job where you have to be near things that bother your skin. Having asthma or eczema. What are the signs or symptoms?  Dry or flaky skin. Redness. Cracks. Itching. Moderate symptoms of this condition include: Pain or a burning feeling. Blisters. Blood or clear fluid coming from cracks in your skin. Swelling. This may be on your eyelids, mouth, or genitals. How is this treated? Your doctor will find out what is making your skin react. Then, you can protect your skin. You may need to use: Steroid creams, ointments, or medicines. Antibiotics or other ointments, if you have a skin infection. Lotion or medicines to help with itching. A bandage. Follow these instructions at home: Skin care Put moisturizer on your skin when it needs it. Put cool, wet cloths on your skin (cool compresses). Put a baking soda paste on your skin. Stir water into baking soda until it looks like a paste. Do not scratch your skin. Try not to have things rub up against your skin. Avoid tight clothing. Avoid using soaps, perfumes, and dyes. Check your skin every day for signs of infection. Check for: More redness, swelling, or pain. More fluid or blood. Warmth. Pus or  a bad smell. Medicines Take or apply over-the-counter and prescription medicines only as told by your doctor. If you were prescribed antibiotics, take or apply them as told by your doctor. Do not stop using them even if you start to feel better. Bathing Take a bath with: Epsom salts. Baking soda. Colloidal oatmeal. Bathe less often. Bathe in warm water. Try not to use hot water. Bandage care If you were given a bandage, change it as told by your doctor. Wash your hands with soap and water for at least 20 seconds before and after you change your bandage. If you cannot use soap and water, use hand sanitizer. General instructions Avoid the things that caused your reaction. If you don't know what caused it, keep a journal. Write down: What you eat. What skin products you use. What you drink. What you wear. Contact a doctor if: You do not get better with treatment. You get worse. You have signs of infection. You have a fever. You have new symptoms. Your bone or joint near the area hurts after the skin has healed. Get help right away if: You see red streaks coming from the area. The area turns darker. You have trouble breathing. This information is not intended to replace advice given to you by your health care provider. Make sure you discuss any questions you have with your health care provider. Document Revised: 03/27/2022 Document Reviewed: 03/27/2022 Elsevier Patient Education  2024 Elsevier Inc.  

## 2023-03-31 NOTE — Assessment & Plan Note (Signed)
Diet and nutrition discussed.  Continue rosuvastatin 10 mg daily. Lipid profile done today. 

## 2023-03-31 NOTE — Assessment & Plan Note (Signed)
Well-controlled with tamsulosin 0.4 mg at bedtime

## 2023-03-31 NOTE — Assessment & Plan Note (Signed)
Occasional low back pain mostly in the morning Relieved then about an hour Exercise regularly Well-controlled

## 2023-03-31 NOTE — Assessment & Plan Note (Addendum)
Chronic stable condition Sees pulmonary on a regular basis Continues daily Trelegy and albuterol as needed Also taking azithromycin 3 times a week Recent bout of congestion

## 2023-03-31 NOTE — Assessment & Plan Note (Signed)
Stable and well-controlled Exercises regularly Ibuprofen helps Pain worse in the morning better about 1 hour later

## 2023-04-12 ENCOUNTER — Ambulatory Visit: Payer: Medicare Other | Admitting: Nurse Practitioner

## 2023-04-26 ENCOUNTER — Encounter: Payer: Self-pay | Admitting: Emergency Medicine

## 2023-05-04 ENCOUNTER — Other Ambulatory Visit: Payer: Self-pay | Admitting: Pulmonary Disease

## 2023-05-07 ENCOUNTER — Encounter: Payer: Self-pay | Admitting: Nurse Practitioner

## 2023-05-07 ENCOUNTER — Ambulatory Visit (INDEPENDENT_AMBULATORY_CARE_PROVIDER_SITE_OTHER): Payer: Medicare Other | Admitting: Nurse Practitioner

## 2023-05-07 ENCOUNTER — Other Ambulatory Visit: Payer: Self-pay | Admitting: Nurse Practitioner

## 2023-05-07 VITALS — BP 120/64 | HR 67 | Ht 69.0 in | Wt 171.8 lb

## 2023-05-07 DIAGNOSIS — J449 Chronic obstructive pulmonary disease, unspecified: Secondary | ICD-10-CM | POA: Diagnosis not present

## 2023-05-07 DIAGNOSIS — Z87891 Personal history of nicotine dependence: Secondary | ICD-10-CM | POA: Diagnosis not present

## 2023-05-07 DIAGNOSIS — Z79899 Other long term (current) drug therapy: Secondary | ICD-10-CM

## 2023-05-07 MED ORDER — AZITHROMYCIN 250 MG PO TABS: 250.0000 mg | ORAL_TABLET | ORAL | 0 refills | Status: DC

## 2023-05-07 NOTE — Assessment & Plan Note (Signed)
Lung RADS 2 08/2022. Repeat CT chest 08/2023 with lung cancer screening program.

## 2023-05-07 NOTE — Progress Notes (Signed)
@Patient  ID: Kevin Shepard, male    DOB: [birth year], 77 y.o.   MRN: [id number]  Chief Complaint  Patient presents with   Follow-up    Pt states he is doing well. He does have a little chest congestion.     Referring provider: Georgina Quint, *  HPI: 77 year old male, former smoker followed for COPD with chronic bronchitis. He is a patient of Dr. Myrlene Broker and last seen in office 01/25/2023. He is followed by the lung cancer screening program. Past medical history significant for HLD, insomnia, chronic constipation, BPH.  TEST/EVENTS:  08/20/2022 LDCT chest: atherosclerosis. Nonenlarged coarsely calcified subcarinal and right hilar nodes, unchanged and compatible with prior granulomatous disease. Mild centrilobular emphysema and diffuse bronchial wall thickening. 4.3 RML nodule has resolved. Lung RADS 2. 11/11/2022 sputum culture positive for moraxella cattarrhalis, AFB neg 11/25/2022 PFT: FVC 61, FEV1 57, ratio 74, TLC 80, DLCOcor 77. Positive BD (16%)  12/11/2022: OV with Dr. Tonia Brooms. He had re-established care back in January 2024. Chronic cough for the past 3 months.  Started on Trelegy 1 puff daily and as needed albuterol.  He then was seen again in February 2024.  Was awaiting culture for AFB.  Had positive sputum culture with Moraxella catarrhalis.  AFB ended up being negative.  He also had pulmonary function testing completed which showed FEV1 of 67% and positive bronchodilator response.  Presented for follow-up.  Still having daily cough and sputum production, despite doxycycline course.  Seems to be more of a chronic bronchitis problem.  Advised starting azithromycin on Monday Wednesday Friday.  Plan for repeat EKG in 6 weeks.  01/25/2023: OV with  NP for follow up after being started on chronic macrolide therapy with azithromycin M/W/F. He tells me he is feeling much better. He has not noticed any more wheezing and his breathing feels pretty good. He's able to exercise again and walk  long distances without trouble. His cough is minimal; usually only when he gets a dry throat. He's producing very little phlegm. No concerns or complaints today. Just curious what he should do with his neb treatments. He is using his Trelegy daily.   05/07/2023: Today - follow up Patient presents today for follow up. Doing well on chronic macrolide therapy. No hearing changes. Breathing feels like it is stable. Able to complete activities without limitations. He does have some occasional chest congestion but usually resolves on his own. Minimal cough; usually only when he feels he needs to clear his throat. Not having to use SABA. On Trelegy daily.   No Known Allergies  Immunization History  Administered Date(s) Administered   Fluad Quad(high Dose 65+) 07/25/2020, 09/15/2022   Influenza, High Dose Seasonal PF 06/07/2018, 07/03/2019   Influenza-Unspecified 07/05/2017, 07/13/2021   PFIZER(Purple Top)SARS-COV-2 Vaccination 11/14/2019, 12/10/2019, 08/02/2020   Pfizer Covid-19 Vaccine Bivalent Booster 67yrs & up 08/24/2022   Pneumococcal Conjugate-13 06/27/2014   Pneumococcal Polysaccharide-23 02/05/2006, 05/15/2013   Rsv, Mab, Judi Cong, 0.5 Ml, Neonate To 24 Mos(Beyfortus) 08/24/2022   Tdap 08/16/2007   Zoster Recombinant(Shingrix) 08/24/2022    Past Medical History:  Diagnosis Date   Blood transfusion without reported diagnosis    Cataract    beginning   Colon polyps    COPD (chronic obstructive pulmonary disease) (HCC)    Hyperlipidemia     Tobacco History: Social History   Tobacco Use  Smoking Status Former   Current packs/day: 0.00   Average packs/day: 1 pack/day for 40.0 years (40.0 ttl pk-yrs)   Types:  Cigarettes   Start date: 10/04/1977   Quit date: 10/04/2017   Years since quitting: 5.5   Passive exposure: Past  Smokeless Tobacco Never   Counseling given: Not Answered   Outpatient Medications Prior to Visit  Medication Sig Dispense Refill   albuterol  (PROVENTIL) (2.5 MG/3ML) 0.083% nebulizer solution Take 3 mLs (2.5 mg total) by nebulization every 6 (six) hours as needed for wheezing or shortness of breath. 75 mL 12   Cholecalciferol (VITAMIN D3 PO) Take by mouth daily. Take one pill daily     clobetasol cream (TEMOVATE) 0.05 % Apply 1 Application topically 2 (two) times daily. 30 g 0   Eszopiclone 3 MG TABS Take 1 tablet (3 mg total) by mouth at bedtime. Take immediately before bedtime as needed. 30 tablet 3   METAMUCIL FIBER PO Take by mouth as needed.     Multiple Vitamin (MULTIVITAMIN ADULT PO) Take by mouth daily. Take tablet daily     PREVIDENT 5000 BOOSTER PLUS 1.1 % PSTE SMARTSIG:Sparingly To Teeth     rosuvastatin (CRESTOR) 10 MG tablet TAKE 1 TABLET(10 MG) BY MOUTH DAILY 90 tablet 3   tamsulosin (FLOMAX) 0.4 MG CAPS capsule Take 1 capsule (0.4 mg total) by mouth daily. 90 capsule 3   TRELEGY ELLIPTA 100-62.5-25 MCG/ACT AEPB INHALE 1 PUFF INTO THE LUNGS EVERY DAY 60 each 3   VENTOLIN HFA 108 (90 Base) MCG/ACT inhaler INHALE 1 TO 2 PUFFS INTO THE LUNGS EVERY 6 HOURS AS NEEDED FOR WHEEZING OR SHORTNESS OF BREATH 18 g 1   azithromycin (ZITHROMAX) 250 MG tablet Take 1 tablet (250 mg total) by mouth every Monday, Wednesday, and Friday. 36 tablet 2   No facility-administered medications prior to visit.     Review of Systems:   Constitutional: No weight loss or gain, night sweats, fevers, chills, fatigue, or lassitude. HEENT: No headaches, difficulty swallowing, tooth/dental problems, or sore throat. No sneezing, itching, ear ache, nasal congestion, or post nasal drip CV:  No chest pain, orthopnea, PND, swelling in lower extremities, anasarca, dizziness, palpitations, syncope Resp: +shortness of breath with exertion (improved); minimal cough. No excess mucus or change in color of mucus. No hemoptysis. No wheezing.  No chest wall deformity GI:  No heartburn, indigestion, abdominal pain, nausea, vomiting, diarrhea, change in bowel habits,  loss of appetite, bloody stools.  GU: No dysuria, change in color of urine, urgency or frequency.   Skin: No rash, lesions, ulcerations MSK:  No joint pain or swelling.   Neuro: No dizziness or lightheadedness.  Psych: No depression or anxiety. Mood stable.     Physical Exam:  BP 120/64   Pulse 67   Ht 5\' 9"  (1.753 m)   Wt 171 lb 12.8 oz (77.9 kg)   SpO2 95%   BMI 25.37 kg/m   GEN: Pleasant, interactive, well-appearing; in no acute distress HEENT:  Normocephalic and atraumatic. PERRLA. Sclera white. Nasal turbinates pink, moist and patent bilaterally. No rhinorrhea present. Oropharynx pink and moist, without exudate or edema. No lesions, ulcerations, or postnasal drip.  NECK:  Supple w/ fair ROM. No JVD present. Normal carotid impulses w/o bruits. Thyroid symmetrical with no goiter or nodules palpated. No lymphadenopathy.   CV: RRR, no m/r/g, no peripheral edema. Pulses intact, +2 bilaterally. No cyanosis, pallor or clubbing. PULMONARY:  Unlabored, regular breathing. Clear bilaterally A&P w/o wheezes/rales/rhonchi. No accessory muscle use.  GI: BS present and normoactive. Soft, non-tender to palpation. No organomegaly or masses detected. MSK: No erythema, warmth or tenderness. Cap  refil <2 sec all extrem. No deformities or joint swelling noted.  Neuro: A/Ox3. No focal deficits noted.   Skin: Warm, no lesions or rashe Psych: Normal affect and behavior. Judgement and thought content appropriate.     Lab Results:  CBC    Component Value Date/Time   WBC 4.9 03/31/2023 0933   RBC 4.54 03/31/2023 0933   HGB 13.9 03/31/2023 0933   HGB 13.6 07/18/2019 1113   HCT 41.3 03/31/2023 0933   HCT 40.8 07/18/2019 1113   PLT 271.0 03/31/2023 0933   PLT 295 07/18/2019 1113   MCV 91.0 03/31/2023 0933   MCV 92 07/18/2019 1113   MCH 30.9 10/30/2022 1534   MCHC 33.6 03/31/2023 0933   RDW 14.1 03/31/2023 0933   RDW 12.9 07/18/2019 1113   LYMPHSABS 1.5 03/31/2023 0933   LYMPHSABS 2.2  07/18/2019 1113   MONOABS 0.5 03/31/2023 0933   EOSABS 0.3 03/31/2023 0933   EOSABS 0.3 07/18/2019 1113   BASOSABS 0.0 03/31/2023 0933   BASOSABS 0.0 07/18/2019 1113    BMET    Component Value Date/Time   NA 137 03/31/2023 0933   NA 140 07/31/2020 1020   K 4.5 03/31/2023 0933   CL 102 03/31/2023 0933   CO2 28 03/31/2023 0933   GLUCOSE 94 03/31/2023 0933   BUN 15 03/31/2023 0933   BUN 12 07/31/2020 1020   CREATININE 1.06 03/31/2023 0933   CALCIUM 9.7 03/31/2023 0933   GFRNONAA 73 07/31/2020 1020   GFRAA 85 07/31/2020 1020    BNP No results found for: "BNP"   Imaging:  No results found.  Administration History     None          Latest Ref Rng & Units 11/25/2022    9:02 AM  PFT Results  FVC-Pre L 2.49   FVC-Predicted Pre % 61   FVC-Post L 2.64   FVC-Predicted Post % 65   Pre FEV1/FVC % % 68   Post FEV1/FCV % % 74   FEV1-Pre L 1.68   FEV1-Predicted Pre % 57   FEV1-Post L 1.96   DLCO uncorrected ml/min/mmHg 18.11   DLCO UNC% % 74   DLCO corrected ml/min/mmHg 18.66   DLCO COR %Predicted % 77   DLVA Predicted % 93   TLC L 5.50   TLC % Predicted % 80   RV % Predicted % 96     No results found for: "NITRICOXIDE"      Assessment & Plan:   COPD  GOLD II/ Group A Moderate COPD with chronic bronchitis. Compensated on current regimen. Bronchitis symptoms have improved with chronic macrolide use. QTc on EKG stable today. Aware of side effect profile and symptoms to monitor/notify of. Action plan in place. Encouraged to remain active.  Patient Instructions  Continue Albuterol inhaler 2 puffs or 3 mL neb every 6 hours as needed for shortness of breath or wheezing. Notify if symptoms persist despite rescue inhaler/neb use. Continue Trelegy 1 puff daily. Brush tongue and rinse mouth afterwards Continue azithromycin 1 tab on Mondays/Wednesdays/Fridays    Follow up with Dr. Tonia Brooms in 3 months. If symptoms do not improve or worsen, please contact office for  sooner follow up or seek emergency care.    Former smoker Lung RADS 2 08/2022. Repeat CT chest 08/2023 with lung cancer screening program.    I spent 28 minutes of dedicated to the care of this patient on the date of this encounter to include pre-visit review of records, face-to-face time with the  patient discussing conditions above, post visit ordering of testing, clinical documentation with the electronic health record, making appropriate referrals as documented, and communicating necessary findings to members of the patients care team.  Noemi Chapel, NP 05/07/2023  Pt aware and understands NP's role.

## 2023-05-07 NOTE — Assessment & Plan Note (Signed)
Moderate COPD with chronic bronchitis. Compensated on current regimen. Bronchitis symptoms have improved with chronic macrolide use. QTc on EKG stable today. Aware of side effect profile and symptoms to monitor/notify of. Action plan in place. Encouraged to remain active.  Patient Instructions  Continue Albuterol inhaler 2 puffs or 3 mL neb every 6 hours as needed for shortness of breath or wheezing. Notify if symptoms persist despite rescue inhaler/neb use. Continue Trelegy 1 puff daily. Brush tongue and rinse mouth afterwards Continue azithromycin 1 tab on Mondays/Wednesdays/Fridays    Follow up with Dr. Tonia Brooms in 3 months. If symptoms do not improve or worsen, please contact office for sooner follow up or seek emergency care.

## 2023-05-07 NOTE — Patient Instructions (Addendum)
Continue Albuterol inhaler 2 puffs or 3 mL neb every 6 hours as needed for shortness of breath or wheezing. Notify if symptoms persist despite rescue inhaler/neb use. Continue Trelegy 1 puff daily. Brush tongue and rinse mouth afterwards Continue azithromycin 1 tab on Mondays/Wednesdays/Fridays    Follow up with Dr. Tonia Brooms in 3 months. If symptoms do not improve or worsen, please contact office for sooner follow up or seek emergency care.

## 2023-05-16 ENCOUNTER — Ambulatory Visit
Admission: RE | Admit: 2023-05-16 | Discharge: 2023-05-16 | Disposition: A | Discharge status: Home / Self Care | Payer: Medicare Other | Source: Ambulatory Visit | Attending: Internal Medicine | Admitting: Internal Medicine

## 2023-05-16 ENCOUNTER — Ambulatory Visit (INDEPENDENT_AMBULATORY_CARE_PROVIDER_SITE_OTHER): Payer: Medicare Other

## 2023-05-16 VITALS — BP 132/76 | HR 49 | Temp 97.5°F | Resp 16

## 2023-05-16 DIAGNOSIS — R0781 Pleurodynia: Secondary | ICD-10-CM

## 2023-05-16 DIAGNOSIS — S20211A Contusion of right front wall of thorax, initial encounter: Secondary | ICD-10-CM

## 2023-05-16 DIAGNOSIS — S299XXA Unspecified injury of thorax, initial encounter: Secondary | ICD-10-CM | POA: Diagnosis not present

## 2023-05-16 NOTE — ED Provider Notes (Signed)
[facility]    CSN: [id number] Arrival date & time: 05/16/23  6213      History   Chief Complaint Chief Complaint  Patient presents with   Chest Injury    [facility] Thursday. Very sore, difficult to move right side. - Entered by patient    HPI Kevin Shepard is a 77 y.o. male presents for evaluation of rib pain.  Patient reports 2 days ago while it was raining he slipped on a small ramp going into his shed falling onto his right side.  Denies head injury or LOC.  Since then he has been reporting a persistent right lateral to lower anterior rib pain that is worse with movement and somewhat worse with deep breathing.  Denies any shortness of breath, hemoptysis, bruising or swelling of the area.  He is not on blood thinning medications.  No neck, right shoulder, right arm or back pain.  No history of rib fractures in the past.  He has been taking ibuprofen intermittently for his symptoms.  No other injuries or concerns at this time.  HPI  Past Medical History:  Diagnosis Date   Blood transfusion without reported diagnosis    Cataract    beginning   Colon polyps    COPD (chronic obstructive pulmonary disease) (HCC)    Hyperlipidemia     Patient Active Problem List   Diagnosis Date Noted   Chronic constipation 02/10/2022   Lower urinary tract symptoms 02/10/2022   Dyslipidemia 01/29/2021   Atherosclerosis of aorta (HCC) 01/29/2021   Insomnia 01/29/2021   Atherosclerosis of native coronary artery of native heart without angina pectoris 01/29/2021   Rash and nonspecific skin eruption 10/17/2020   Seborrheic keratoses 04/11/2020   Actinic keratoses 04/11/2020   COPD  GOLD II/ Group A 06/02/2018   Left upper lobe pulmonary nodule 06/02/2018   Former smoker 04/06/2018   Alcohol use 01/10/2018   Chronic bilateral low back pain without sciatica 01/10/2018   History of tobacco use 01/10/2018   History of back surgery 01/10/2018    Past Surgical History:  Procedure  Laterality Date   APPENDECTOMY     per patient 1950's   COLONOSCOPY     Last 02/19/2017 [facility] an 8 mm polyps rectum and hepatic flexure with history of polyps prior   FRACTURE SURGERY  1974   right clavicle   LUMBAR SPINE SURGERY  2018   SPINE SURGERY  10/2016   TONSILLECTOMY         Home Medications    Prior to Admission medications   Medication Sig Start Date End Date Taking? Authorizing Provider  albuterol (PROVENTIL) (2.5 MG/3ML) 0.083% nebulizer solution Take 3 mLs (2.5 mg total) by nebulization every 6 (six) hours as needed for wheezing or shortness of breath. 01/25/23   Allison Quarry, Ruby Cola, NP  azithromycin (ZITHROMAX) 250 MG tablet Take 1 tablet (250 mg total) by mouth every Monday, Wednesday, and Friday. 05/07/23   Cobb, Ruby Cola, NP  Cholecalciferol (VITAMIN D3 PO) Take by mouth daily. Take one pill daily    [provider]  clobetasol cream (TEMOVATE) 0.05 % Apply 1 Application topically 2 (two) times daily. 03/31/23   Georgina Quint, MD  Eszopiclone 3 MG TABS Take 1 tablet (3 mg total) by mouth at bedtime. Take immediately before bedtime as needed. 03/31/23   Georgina Quint, MD  METAMUCIL FIBER PO Take by mouth as needed.    [provider]  Multiple Vitamin (MULTIVITAMIN ADULT PO)  Take by mouth daily. Take tablet daily    [provider]  PREVIDENT 5000 BOOSTER PLUS 1.1 % PSTE SMARTSIG:Sparingly To Teeth 05/14/21   [provider]  rosuvastatin (CRESTOR) 10 MG tablet TAKE 1 TABLET(10 MG) BY MOUTH DAILY 02/22/23   Georgina Quint, MD  tamsulosin (FLOMAX) 0.4 MG CAPS capsule Take 1 capsule (0.4 mg total) by mouth daily. 09/15/22   Georgina Quint, MD  TRELEGY ELLIPTA 100-62.5-25 MCG/ACT AEPB INHALE 1 PUFF INTO THE LUNGS EVERY DAY 02/23/23   Cobb, Ruby Cola, NP  VENTOLIN HFA 108 (90 Base) MCG/ACT inhaler INHALE 1 TO 2 PUFFS INTO THE LUNGS EVERY 6 HOURS AS NEEDED FOR WHEEZING OR SHORTNESS OF  BREATH 05/04/23   Icard, Rachel Bo, DO    Family History Family History  Problem Relation Age of Onset   Cancer Mother        Breast   Early death Father    Heart disease Father 40       rheumatic heart disease   Hyperlipidemia Sister    Colon cancer Neg Hx    Colon polyps Neg Hx    Crohn's disease Neg Hx    Esophageal cancer Neg Hx    Rectal cancer Neg Hx    Stomach cancer Neg Hx    Ulcerative colitis Neg Hx     Social History Social History   Tobacco Use   Smoking status: Former    Current packs/day: 0.00    Average packs/day: 1 pack/day for 40.0 years (40.0 ttl pk-yrs)    Types: Cigarettes    Start date: 10/04/1977    Quit date: 10/04/2017    Years since quitting: 5.6    Passive exposure: Past   Smokeless tobacco: Never  Vaping Use   Vaping status: Never Used  Substance Use Topics   Alcohol use: Yes    Comment: 2-3 days/week   Drug use: Never     Allergies   Patient has no known allergies.   Review of Systems Review of Systems  Musculoskeletal:        Right rib pain after fall     Physical Exam Triage Vital Signs ED Triage Vitals  Encounter Vitals Group     BP 05/16/23 0934 132/76     Systolic BP Percentile --      Diastolic BP Percentile --      Pulse Rate 05/16/23 0934 (!) 49     Resp 05/16/23 0934 16     Temp 05/16/23 0934 (!) 97.5 F (36.4 C)     Temp Source 05/16/23 0934 Oral     SpO2 05/16/23 0934 98 %     Weight --      Height --      Head Circumference --      Peak Flow --      Pain Score 05/16/23 0938 7     Pain Loc --      Pain Education --      Exclude from Growth Chart --    No data found.  Updated Vital Signs BP 132/76 (BP Location: Right Arm)   Pulse (!) 49   Temp (!) 97.5 F (36.4 C) (Oral)   Resp 16   SpO2 98%   Visual Acuity Right Eye Distance:   Left Eye Distance:   Bilateral Distance:    Right Eye Near:   Left Eye Near:    Bilateral Near:     Physical Exam Vitals and nursing note reviewed.   Constitutional:  General: He is not in acute distress.    Appearance: Normal appearance. He is not ill-appearing.  HENT:     Head: Normocephalic and atraumatic.  Eyes:     Pupils: Pupils are equal, round, and reactive to light.  Cardiovascular:     Rate and Rhythm: Normal rate and regular rhythm.     Heart sounds: Normal heart sounds.  Pulmonary:     Effort: Pulmonary effort is normal. No respiratory distress.     Breath sounds: Normal breath sounds. No stridor. No wheezing.  Chest:     Chest wall: Tenderness present.       Comments: There is no swelling, deformity, step-off of the right ribs.  There is tenderness to palpation to the mid lateral right ribs that does extend to the right anterior lower ribs.  Equal chest expansion bilaterally. Skin:    General: Skin is warm and dry.  Neurological:     General: No focal deficit present.     Mental Status: He is alert and oriented to person, place, and time.  Psychiatric:        Mood and Affect: Mood normal.        Behavior: Behavior normal.      UC Treatments / Results  Labs (all labs ordered are listed, but only abnormal results are displayed) Labs Reviewed - No data to display  EKG   Radiology DG Ribs Unilateral W/Chest Right  Result Date: 05/16/2023 CLINICAL DATA:  Fall 3 days ago EXAM: RIGHT RIBS AND CHEST - 3+ VIEW COMPARISON:  None Available. FINDINGS: No fracture or other bone lesions are seen involving the ribs. There is no evidence of pneumothorax or pleural effusion. Mild LEFT basilar atelectasis. Heart size and mediastinal contours are within normal limits. IMPRESSION: No evidence of thoracic trauma. Electronically Signed   By: Genevive Bi M.D.   On: 05/16/2023 10:10    Procedures Procedures (including critical care time)  Medications Ordered in UC Medications - No data to display  Initial Impression / Assessment and Plan / UC Course  I have reviewed the triage vital signs and the nursing  notes.  Pertinent labs & imaging results that were available during my care of the patient were reviewed by me and considered in my medical decision making (see chart for details).     Reviewed exam and symptoms with patient.  No red flags.  X-ray negative for rib fracture.  Discussed soft tissue injury/chest wall contusion as cause of symptoms.  Continue OTC ibuprofen or Tylenol as needed.  Cool compresses to the area as needed.  Activity as tolerated.  PCP follow-up if symptoms do not improve.  ER precautions reviewed and patient verbalized understanding. Final Clinical Impressions(s) / UC Diagnoses   Final diagnoses:  Rib pain on right side  Contusion of right chest wall, initial encounter     Discharge Instructions      Your x-ray were negative for rib fractures.  Please continue over-the-counter Tylenol or ibuprofen as needed for pain.  Cool compresses to the area as needed.  Please follow-up with your PCP if your symptoms are not improving.  Please go to the ER if you develop any worsening symptoms.  I hope you feel better soon!     ED Prescriptions   None    PDMP not reviewed this encounter.   Radford Pax, NP 05/16/23 1026

## 2023-05-16 NOTE — ED Triage Notes (Signed)
Pt presents to UC w/ c/o right rib pain after falling on his side 3 days go. Pt states he was walking in the rain and slipped. Denies trouble breathing. Tylenol and ibuprofen help some.

## 2023-05-16 NOTE — Discharge Instructions (Addendum)
Your x-ray were negative for rib fractures.  Please continue over-the-counter Tylenol or ibuprofen as needed for pain.  Cool compresses to the area as needed.  Please follow-up with your PCP if your symptoms are not improving.  Please go to the ER if you develop any worsening symptoms.  I hope you feel better soon!

## 2023-06-04 DIAGNOSIS — Z23 Encounter for immunization: Secondary | ICD-10-CM | POA: Diagnosis not present

## 2023-06-10 ENCOUNTER — Encounter: Payer: Self-pay | Admitting: Emergency Medicine

## 2023-06-10 DIAGNOSIS — Z23 Encounter for immunization: Secondary | ICD-10-CM | POA: Diagnosis not present

## 2023-06-23 ENCOUNTER — Ambulatory Visit (INDEPENDENT_AMBULATORY_CARE_PROVIDER_SITE_OTHER): Payer: Medicare Other

## 2023-06-23 VITALS — Ht 69.0 in | Wt 160.0 lb

## 2023-06-23 DIAGNOSIS — Z0000 Encounter for general adult medical examination without abnormal findings: Secondary | ICD-10-CM | POA: Diagnosis not present

## 2023-06-23 NOTE — Progress Notes (Signed)
Subjective:   Kevin Shepard is a 77 y.o. male who presents for Medicare Annual/Subsequent preventive examination.  Visit Complete: Virtual  I connected with  Kevin Shepard on 06/23/23 by a audio enabled telemedicine application and verified that I am speaking with the correct person using two identifiers.  Patient Location: Home  Provider Location: Office/Clinic  I discussed the limitations of evaluation and management by telemedicine. The patient expressed understanding and agreed to proceed.  Vital Signs: Because this visit was a virtual/telehealth visit, some criteria may be missing or patient reported. Any vitals not documented were not able to be obtained and vitals that have been documented are patient reported.   Patient provided his/her weight during this virtual phone visit.  Cardiac Risk Factors include: advanced age (>63men, >10 women);dyslipidemia;family history of premature cardiovascular disease;male gender     Objective:    Today's Vitals   06/23/23 1446 06/23/23 1447  Weight: 160 lb (72.6 kg)   Height: 5\' 9"  (1.753 m)   PainSc: 2  2   PainLoc: Back    Body mass index is 23.63 kg/m.     06/23/2023    2:50 PM 05/15/2022   11:08 AM 02/01/2020   10:10 AM  Advanced Directives  Does Patient Have a Medical Advance Directive? Yes Yes Yes  Type of Estate agent of [place];Living will Healthcare Power of [place];Living will Healthcare Power of [place];Living will  Does patient want to make changes to medical advance directive?   No - Patient declined  Copy of Healthcare Power of Attorney in Chart? No - copy requested No - copy requested No - copy requested    Current Medications (verified) Outpatient Encounter Medications as of 06/23/2023  Medication Sig   albuterol (PROVENTIL) (2.5 MG/3ML) 0.083% nebulizer solution Take 3 mLs (2.5 mg total) by nebulization every 6 (six) hours as needed for wheezing or shortness of breath.   azithromycin  (ZITHROMAX) 250 MG tablet Take 1 tablet (250 mg total) by mouth every Monday, Wednesday, and Friday.   Cholecalciferol (VITAMIN D3 PO) Take by mouth daily. Take one pill daily   clobetasol cream (TEMOVATE) 0.05 % Apply 1 Application topically 2 (two) times daily.   Eszopiclone 3 MG TABS Take 1 tablet (3 mg total) by mouth at bedtime. Take immediately before bedtime as needed.   METAMUCIL FIBER PO Take by mouth as needed.   Multiple Vitamin (MULTIVITAMIN ADULT PO) Take by mouth daily. Take tablet daily   PREVIDENT 5000 BOOSTER PLUS 1.1 % PSTE SMARTSIG:Sparingly To Teeth   rosuvastatin (CRESTOR) 10 MG tablet TAKE 1 TABLET(10 MG) BY MOUTH DAILY   tamsulosin (FLOMAX) 0.4 MG CAPS capsule Take 1 capsule (0.4 mg total) by mouth daily.   TRELEGY ELLIPTA 100-62.5-25 MCG/ACT AEPB INHALE 1 PUFF INTO THE LUNGS EVERY DAY   VENTOLIN HFA 108 (90 Base) MCG/ACT inhaler INHALE 1 TO 2 PUFFS INTO THE LUNGS EVERY 6 HOURS AS NEEDED FOR WHEEZING OR SHORTNESS OF BREATH   No facility-administered encounter medications on file as of 06/23/2023.    Allergies (verified) Patient has no known allergies.   History: Past Medical History:  Diagnosis Date   Blood transfusion without reported diagnosis    Cataract    beginning   Colon polyps    COPD (chronic obstructive pulmonary disease) (HCC)    Hyperlipidemia    Past Surgical History:  Procedure Laterality Date   APPENDECTOMY     per patient 1950's   COLONOSCOPY     Last 02/19/2017 [facility]-for an 8 mm polyps rectum and hepatic flexure with history of polyps prior   FRACTURE SURGERY  1974   right clavicle   LUMBAR SPINE SURGERY  2018   SPINE SURGERY  10/2016   TONSILLECTOMY     Family History  Problem Relation Age of Onset   Cancer Mother        Breast   Early death Father    Heart disease Father 23       rheumatic heart disease   Hyperlipidemia Sister    Colon cancer Neg Hx    Colon polyps Neg Hx    Crohn's disease Neg Hx     Esophageal cancer Neg Hx    Rectal cancer Neg Hx    Stomach cancer Neg Hx    Ulcerative colitis Neg Hx    Social History   Socioeconomic History   Marital status: Married    Spouse name: Not on file   Number of children: Not on file   Years of education: Not on file   Highest education level: Not on file  Occupational History   Occupation: stock market trading  Tobacco Use   Smoking status: Former    Current packs/day: 0.00    Average packs/day: 1 pack/day for 40.0 years (40.0 ttl pk-yrs)    Types: Cigarettes    Start date: 10/04/1977    Quit date: 10/04/2017    Years since quitting: 5.7    Passive exposure: Past   Smokeless tobacco: Never  Vaping Use   Vaping status: Never Used  Substance and Sexual Activity   Alcohol use: Yes    Comment: 2-3 days/week   Drug use: Never   Sexual activity: Yes    Partners: Female  Other Topics Concern   Not on file  Social History Narrative   Not on file   Social Determinants of Health   Financial Resource Strain: Low Risk  (06/23/2023)   Overall Financial Resource Strain (CARDIA)    Difficulty of Paying Living Expenses: Not hard at all  Food Insecurity: No Food Insecurity (06/23/2023)   Hunger Vital Sign    Worried About Running Out of Food in the Last Year: Never true    Ran Out of Food in the Last Year: Never true  Transportation Needs: No Transportation Needs (06/23/2023)   PRAPARE - Administrator, Civil Service (Medical): No    Lack of Transportation (Non-Medical): No  Physical Activity: Sufficiently Active (06/23/2023)   Exercise Vital Sign    Days of Exercise per Week: 7 days    Minutes of Exercise per Session: 50 min  Stress: No Stress Concern Present (06/23/2023)   [employer] of Occupational Health - Occupational Stress Questionnaire    Feeling of Stress : Not at all  Social Connections: Moderately Isolated (06/23/2023)   Social Connection and Isolation Panel [NHANES]    Frequency of Communication with  Friends and Family: Three times a week    Frequency of Social Gatherings with Friends and Family: Three times a week    Attends Religious Services: Never    Active Member of Clubs or Organizations: No    Attends Banker Meetings: Never    Marital Status: Married    Tobacco Counseling Counseling given: Not Answered   Clinical Intake:  Pre-visit preparation completed: Yes  Pain : 0-10 Pain Score: 2  Pain Type: Chronic pain Pain Location: Back     BMI - recorded: 23.63 Nutritional Status: BMI of 19-24  Normal  Nutritional Risks: None Diabetes: No  How often do you need to have someone help you when you read instructions, pamphlets, or other written materials from your doctor or pharmacy?: 1 - Never What is the last grade level you completed in school?: [employer]  Interpreter Needed?: No  Information entered by :: Susie Cassette, LPN.   Activities of Daily Living    06/23/2023    2:53 PM  In your present state of health, do you have any difficulty performing the following activities:  Hearing? 0  Vision? 0  Difficulty concentrating or making decisions? 0  Walking or climbing stairs? 0  Dressing or bathing? 0  Doing errands, shopping? 0  Preparing Food and eating ? N  Using the Toilet? N  In the past six months, have you accidently leaked urine? N  Do you have problems with loss of bowel control? N  Managing your Medications? N  Managing your Finances? N  Housekeeping or managing your Housekeeping? N    Patient Care Team: Georgina Quint, MD as PCP - General (Internal Medicine) [facility], [place] as Consulting Physician (Optometry)  Indicate any recent Medical Services you may have received from other than [facility] providers in the past year (date may be approximate).     Assessment:   This is a routine wellness examination for Kevin Shepard.  Hearing/Vision screen Hearing Screening - Comments:: Denies hearing difficulties    Vision Screening - Comments:: Wears rx glasses - up to date with routine eye exams with Seth Bake, OD at [facility]    Goals Addressed             This Visit's Progress    My healthcare goal is to stay healthy and lose a couple more pounds.        Depression Screen    06/23/2023    2:52 PM 03/31/2023    8:40 AM 10/14/2022    1:31 PM 09/15/2022    9:20 AM 05/15/2022   11:10 AM 05/15/2022   11:08 AM 05/15/2022   11:07 AM  PHQ 2/9 Scores  PHQ - 2 Score 0 0 0 0 0 0 0  PHQ- 9 Score 3          Fall Risk    06/23/2023    2:50 PM 03/31/2023    8:40 AM 10/14/2022    1:31 PM 09/15/2022    9:20 AM 05/15/2022   11:08 AM  Fall Risk   Falls in the past year? 1 0 0 0 0  Number falls in past yr: 0 0 0 0 0  Injury with Fall? 0  0 0 0  Risk for fall due to :  No Fall Risks No Fall Risks No Fall Risks   Follow up Falls evaluation completed;Education provided Falls evaluation completed Falls evaluation completed Falls evaluation completed Falls evaluation completed;Education provided    MEDICARE RISK AT HOME: Medicare Risk at Home Any stairs in or around the home?: No If so, are there any without handrails?: No Home free of loose throw rugs in walkways, pet beds, electrical cords, etc?: Yes Adequate lighting in your home to reduce risk of falls?: Yes Life alert?: No Use of a cane, walker or w/c?: No Grab bars in the bathroom?: No Shower chair or bench in shower?: No Elevated toilet seat or a handicapped toilet?: No  TIMED UP AND GO:  Was the test performed?  No    Cognitive Function:  06/23/2023    2:52 PM 05/15/2022   11:11 AM 02/01/2020   10:10 AM  6CIT Screen  What Year? 0 points 0 points 0 points  What month? 0 points 0 points 0 points  What time? 0 points 0 points 0 points  Count back from 20 0 points 0 points 0 points  Months in reverse 0 points 0 points 0 points  Repeat phrase 0 points 0 points 0 points  Total Score 0 points 0 points 0 points     Immunizations Immunization History  Administered Date(s) Administered   Fluad Quad(high Dose 65+) 07/25/2020, 09/15/2022, 06/04/2023   Influenza, High Dose Seasonal PF 06/07/2018, 07/03/2019   Influenza-Unspecified 07/05/2017, 07/13/2021   PFIZER(Purple Top)SARS-COV-2 Vaccination 11/14/2019, 12/10/2019, 08/02/2020, 06/10/2023   Pfizer Covid-19 Vaccine Bivalent Booster 11yrs & up 08/24/2022   Pneumococcal Conjugate-13 06/27/2014   Pneumococcal Polysaccharide-23 02/05/2006, 05/15/2013   Rsv, Mab, Judi Cong, 0.5 Ml, Neonate To 24 Mos(Beyfortus) 08/24/2022   Tdap 08/16/2007   Zoster Recombinant(Shingrix) 08/24/2022, 06/14/2023    TDAP status: Due, Education has been provided regarding the importance of this vaccine. Advised may receive this vaccine at local pharmacy or Health Dept. Aware to provide a copy of the vaccination record if obtained from local pharmacy or Health Dept. Verbalized acceptance and understanding.  Flu Vaccine status: Up to date  Pneumococcal vaccine status: Up to date  Covid-19 vaccine status: Completed vaccines  Qualifies for Shingles Vaccine? Yes   Zostavax completed No   Shingrix Completed?: Yes  Screening Tests Health Maintenance  Topic Date Due   Hepatitis C Screening  Never done   COVID-19 Vaccine (6 - 2023-24 season) 08/05/2023   Lung Cancer Screening  08/21/2023   Medicare Annual Wellness (AWV)  06/22/2024   Colonoscopy  10/14/2027   Pneumonia Vaccine 19+ Years old  Completed   INFLUENZA VACCINE  Completed   Zoster Vaccines- Shingrix  Completed   HPV VACCINES  Aged Out   DTaP/Tdap/Td  Discontinued    Health Maintenance  Health Maintenance Due  Topic Date Due   Hepatitis C Screening  Never done    Colorectal cancer screening: Type of screening: Colonoscopy. Completed 10/13/2022. Repeat every 5 years  Lung Cancer Screening: (Low Dose CT Chest recommended if Age 49-80 years, 20 pack-year currently smoking OR have quit w/in 15years.)  does qualify.   Lung Cancer Screening Referral: scheduled for 08/23/2023  Additional Screening:  Hepatitis C Screening: does qualify; Completed: no  Vision Screening: Recommended annual ophthalmology exams for early detection of glaucoma and other disorders of the eye. Is the patient up to date with their annual eye exam?  Yes  Who is the provider or what is the name of the office in which the patient attends annual eye exams? Seth Bake, OD. If pt is not established with a provider, would they like to be referred to a provider to establish care? No .   Dental Screening: Recommended annual dental exams for proper oral hygiene  Diabetic Foot Exam: N/A  Community Resource Referral / Chronic Care Management: CRR required this visit?  No   CCM required this visit?  No     Plan:     I have personally reviewed and noted the following in the patient's chart:   Medical and social history Use of alcohol, tobacco or illicit drugs  Current medications and supplements including opioid prescriptions. Patient is not currently taking opioid prescriptions. Functional ability and status Nutritional status Physical activity Advanced directives List of other physicians Hospitalizations, surgeries, and  ER visits in previous 12 months Vitals Screenings to include cognitive, depression, and falls Referrals and appointments  In addition, I have reviewed and discussed with patient certain preventive protocols, quality metrics, and best practice recommendations. A written personalized care plan for preventive services as well as general preventive health recommendations were provided to patient.     Mickeal Needy, LPN   12/26/4008   After Visit Summary: (Mail) Due to this being a telephonic visit, the after visit summary with patients personalized plan was offered to patient via mail   Nurse Notes: Normal cognitive status assessed by direct observation via telephone conversation by this  Nurse Health Advisor. No abnormalities found.  Patient provided his/her weight during this virtual phone visit.

## 2023-06-23 NOTE — Patient Instructions (Addendum)
Kevin Shepard , Thank you for taking time to come for your Medicare Wellness Visit. I appreciate your ongoing commitment to your health goals. Please review the following plan we discussed and let me know if I can assist you in the future.   Referrals/Orders/Follow-Ups/Clinician Recommendations: No  This is a list of the screening recommended for you and due dates:  Health Maintenance  Topic Date Due   Hepatitis C Screening  Never done   COVID-19 Vaccine (6 - 2023-24 season) 08/05/2023   Screening for Lung Cancer  08/21/2023   Medicare Annual Wellness Visit  06/22/2024   Colon Cancer Screening  10/14/2027   Pneumonia Vaccine  Completed   Flu Shot  Completed   Zoster (Shingles) Vaccine  Completed   HPV Vaccine  Aged Out   DTaP/Tdap/Td vaccine  Discontinued    Advanced directives: (Copy Requested) Please bring a copy of your health care power of attorney and living will to the office to be added to your chart at your convenience.  Next Medicare Annual Wellness Visit scheduled for next year: Yes  *insert Preventive Care attachment *Insert FALL PREVENTION attachment

## 2023-07-14 DIAGNOSIS — H43393 Other vitreous opacities, bilateral: Secondary | ICD-10-CM | POA: Diagnosis not present

## 2023-07-19 ENCOUNTER — Ambulatory Visit: Payer: Medicare Other | Admitting: Pulmonary Disease

## 2023-07-23 ENCOUNTER — Ambulatory Visit (INDEPENDENT_AMBULATORY_CARE_PROVIDER_SITE_OTHER): Payer: Medicare Other | Admitting: Pulmonary Disease

## 2023-07-23 ENCOUNTER — Encounter: Payer: Self-pay | Admitting: Pulmonary Disease

## 2023-07-23 VITALS — BP 124/66 | HR 60 | Temp 98.3°F | Ht 69.0 in | Wt 174.2 lb

## 2023-07-23 DIAGNOSIS — Z122 Encounter for screening for malignant neoplasm of respiratory organs: Secondary | ICD-10-CM | POA: Diagnosis not present

## 2023-07-23 DIAGNOSIS — J449 Chronic obstructive pulmonary disease, unspecified: Secondary | ICD-10-CM | POA: Diagnosis not present

## 2023-07-23 DIAGNOSIS — Z87891 Personal history of nicotine dependence: Secondary | ICD-10-CM | POA: Diagnosis not present

## 2023-07-23 DIAGNOSIS — Z79899 Other long term (current) drug therapy: Secondary | ICD-10-CM

## 2023-07-23 MED ORDER — BREZTRI AEROSPHERE 160-9-4.8 MCG/ACT IN AERO: 2.0000 | INHALATION_SPRAY | Freq: Two times a day (BID) | RESPIRATORY_TRACT | Status: DC

## 2023-07-23 MED ORDER — BREZTRI AEROSPHERE 160-9-4.8 MCG/ACT IN AERO: 2.0000 | INHALATION_SPRAY | Freq: Two times a day (BID) | RESPIRATORY_TRACT | 6 refills | Status: AC

## 2023-07-23 MED ORDER — AZITHROMYCIN 250 MG PO TABS
250.0000 mg | ORAL_TABLET | ORAL | 3 refills | Status: DC
Start: 2023-07-23 — End: 2023-09-20

## 2023-07-23 NOTE — Progress Notes (Signed)
Synopsis: Referred in March 2024 for COPD by Georgina Quint, *  Subjective:   PATIENT ID: Kevin Shepard GENDER: male DOB: [birth year], MRN: [id number]  Chief Complaint  Patient presents with   Follow-up    Chest congestion persistent / mild x 8 weeks.  Patient states Trelegy use prn.  Trelegy dries throat if used daily     This is a 77 year old gentleman, past medical history of cataracts, COPD, hyperlipidemia.  Initially seen in the office in 2019 by Dr. Marlin Canary.  Reestablished back in January 2024.  He had chronic cough for the past 3 months.  He is smoked a pack a day of cigarettes from young adulthood through February 2018.  He had PFTs in 2019 that showed moderate airflow obstruction.  Last office visit was here for CT follow-up.  He was started on Trelegy 1 puff daily, as needed albuterol.  Plan for repeat follow-up in 4 to 6 weeks after that office visit.  Was seen again in February 2024.  Was being considered to start Roflumilast or azithromycin.  Was awaiting culture for AFB.  And plans to return today to review pulmonary function test.  Pulmonary function test were completed on 11/25/2022.  PFTs revealed a reduced ratio and a FEV1 of 67% predicted, 1.96 L postbronchodilator response with 16% change, TLC of 80% and DLCO 74%.  OV 12/11/2022: Here today for follow-up.  Still having daily cough and sputum production.  No previous cardiac issues.  Does not have any hearing issues.  OV 07/23/2023: This is a 77 year old gentleman here today for follow-up.  Doing well from a respiratory standpoint has some trouble with the Trelegy occasionally has some scratchy throat.  He tries to use it is much as he can but occasionally is unable to use it.  He still taking azithromycin Monday Wednesday Friday.  Needs refills.    Past Medical History:  Diagnosis Date   Blood transfusion without reported diagnosis    Cataract    beginning   Colon polyps    COPD (chronic obstructive pulmonary disease)  (HCC)    Hyperlipidemia      Family History  Problem Relation Age of Onset   Cancer Mother        Breast   Early death Father    Heart disease Father 65       rheumatic heart disease   Hyperlipidemia Sister    Colon cancer Neg Hx    Colon polyps Neg Hx    Crohn's disease Neg Hx    Esophageal cancer Neg Hx    Rectal cancer Neg Hx    Stomach cancer Neg Hx    Ulcerative colitis Neg Hx      Past Surgical History:  Procedure Laterality Date   APPENDECTOMY     per patient 1950's   COLONOSCOPY     Last 02/19/2017 [facility] an 8 mm polyps rectum and hepatic flexure with history of polyps prior   FRACTURE SURGERY  1974   right clavicle   LUMBAR SPINE SURGERY  2018   SPINE SURGERY  10/2016   TONSILLECTOMY      Social History   Socioeconomic History   Marital status: Married    Spouse name: Not on file   Number of children: Not on file   Years of education: Not on file   Highest education level: Not on file  Occupational History   Occupation: stock market trading  Tobacco Use   Smoking status: Former  Current packs/day: 0.00    Average packs/day: 1 pack/day for 40.0 years (40.0 ttl pk-yrs)    Types: Cigarettes    Start date: 10/04/1977    Quit date: 10/04/2017    Years since quitting: 5.8    Passive exposure: Past   Smokeless tobacco: Never  Vaping Use   Vaping status: Never Used  Substance and Sexual Activity   Alcohol use: Yes    Comment: 2-3 days/week   Drug use: Never   Sexual activity: Yes    Partners: Female  Other Topics Concern   Not on file  Social History Narrative   Not on file   Social Determinants of Health   Financial Resource Strain: Low Risk  (06/23/2023)   Overall Financial Resource Strain (CARDIA)    Difficulty of Paying Living Expenses: Not hard at all  Food Insecurity: No Food Insecurity (06/23/2023)   Hunger Vital Sign    Worried About Running Out of Food in the Last Year: Never true    Ran Out of Food in the  Last Year: Never true  Transportation Needs: No Transportation Needs (06/23/2023)   PRAPARE - Administrator, Civil Service (Medical): No    Lack of Transportation (Non-Medical): No  Physical Activity: Sufficiently Active (06/23/2023)   Exercise Vital Sign    Days of Exercise per Week: 7 days    Minutes of Exercise per Session: 50 min  Stress: No Stress Concern Present (06/23/2023)   [employer] of Occupational Health - Occupational Stress Questionnaire    Feeling of Stress : Not at all  Social Connections: Moderately Isolated (06/23/2023)   Social Connection and Isolation Panel [NHANES]    Frequency of Communication with Friends and Family: Three times a week    Frequency of Social Gatherings with Friends and Family: Three times a week    Attends Religious Services: Never    Active Member of Clubs or Organizations: No    Attends Banker Meetings: Never    Marital Status: Married  Catering manager Violence: Not At Risk (06/23/2023)   Humiliation, Afraid, Rape, and Kick questionnaire    Fear of Current or Ex-Partner: No    Emotionally Abused: No    Physically Abused: No    Sexually Abused: No     No Known Allergies   Outpatient Medications Prior to Visit  Medication Sig Dispense Refill   acyclovir (ZOVIRAX) 200 MG capsule Take 200 mg by mouth 4 (four) times daily.     albuterol (PROVENTIL) (2.5 MG/3ML) 0.083% nebulizer solution Take 3 mLs (2.5 mg total) by nebulization every 6 (six) hours as needed for wheezing or shortness of breath. 75 mL 12   Cholecalciferol (VITAMIN D3 PO) Take by mouth daily. Take one pill daily     clobetasol cream (TEMOVATE) 0.05 % Apply 1 Application topically 2 (two) times daily. 30 g 0   Eszopiclone 3 MG TABS Take 1 tablet (3 mg total) by mouth at bedtime. Take immediately before bedtime as needed. 30 tablet 3   METAMUCIL FIBER PO Take by mouth as needed.     Multiple Vitamin (MULTIVITAMIN ADULT PO) Take by mouth daily. Take  tablet daily     PREVIDENT 5000 BOOSTER PLUS 1.1 % PSTE SMARTSIG:Sparingly To Teeth     rosuvastatin (CRESTOR) 10 MG tablet TAKE 1 TABLET(10 MG) BY MOUTH DAILY 90 tablet 3   tamsulosin (FLOMAX) 0.4 MG CAPS capsule Take 1 capsule (0.4 mg total) by mouth daily. 90 capsule 3   TRELEGY  ELLIPTA 100-62.5-25 MCG/ACT AEPB INHALE 1 PUFF INTO THE LUNGS EVERY DAY (Patient taking differently: Take 1 puff by mouth daily as needed. Patient uses prn) 60 each 3   VENTOLIN HFA 108 (90 Base) MCG/ACT inhaler INHALE 1 TO 2 PUFFS INTO THE LUNGS EVERY 6 HOURS AS NEEDED FOR WHEEZING OR SHORTNESS OF BREATH 18 g 1   azithromycin (ZITHROMAX) 250 MG tablet Take 1 tablet (250 mg total) by mouth every Monday, Wednesday, and Friday. 36 tablet 0   SHINGRIX injection Inject 0.5 mLs into the muscle once. (Patient not taking: Reported on 07/23/2023)     No facility-administered medications prior to visit.    Review of Systems  Constitutional:  Negative for chills, fever, malaise/fatigue and weight loss.  HENT:  Positive for congestion. Negative for hearing loss, sore throat and tinnitus.   Eyes:  Negative for blurred vision and double vision.  Respiratory:  Positive for cough. Negative for hemoptysis, sputum production, shortness of breath, wheezing and stridor.   Cardiovascular:  Negative for chest pain, palpitations, orthopnea, leg swelling and PND.  Gastrointestinal:  Negative for abdominal pain, constipation, diarrhea, heartburn, nausea and vomiting.  Genitourinary:  Negative for dysuria, hematuria and urgency.  Musculoskeletal:  Negative for joint pain and myalgias.  Skin:  Negative for itching and rash.  Neurological:  Negative for dizziness, tingling, weakness and headaches.  Endo/Heme/Allergies:  Negative for environmental allergies. Does not bruise/bleed easily.  Psychiatric/Behavioral:  Negative for depression. The patient is not nervous/anxious and does not have insomnia.   All other systems reviewed and are  negative.    Objective:  Physical Exam Vitals reviewed.  Constitutional:      General: He is not in acute distress.    Appearance: He is well-developed.  HENT:     Head: Normocephalic and atraumatic.  Eyes:     General: No scleral icterus.    Conjunctiva/sclera: Conjunctivae normal.     Pupils: Pupils are equal, round, and reactive to light.  Neck:     Vascular: No JVD.     Trachea: No tracheal deviation.  Cardiovascular:     Rate and Rhythm: Normal rate and regular rhythm.     Heart sounds: Normal heart sounds. No murmur heard. Pulmonary:     Effort: Pulmonary effort is normal. No tachypnea, accessory muscle usage or respiratory distress.     Breath sounds: No stridor. No wheezing, rhonchi or rales.  Abdominal:     General: There is no distension.     Palpations: Abdomen is soft.     Tenderness: There is no abdominal tenderness.  Musculoskeletal:        General: No tenderness.     Cervical back: Neck supple.  Lymphadenopathy:     Cervical: No cervical adenopathy.  Skin:    General: Skin is warm and dry.     Capillary Refill: Capillary refill takes less than 2 seconds.     Findings: No rash.  Neurological:     Mental Status: He is alert and oriented to person, place, and time.  Psychiatric:        Behavior: Behavior normal.      Vitals:   07/23/23 1141  BP: 124/66  Pulse: 60  Temp: 98.3 F (36.8 C)  TempSrc: Oral  SpO2: 96%  Weight: 174 lb 3.2 oz (79 kg)  Height: 5\' 9"  (1.753 m)   96% on RA BMI Readings from Last 3 Encounters:  07/23/23 25.72 kg/m  06/23/23 23.63 kg/m  05/07/23 25.37 kg/m   Wt  Readings from Last 3 Encounters:  07/23/23 174 lb 3.2 oz (79 kg)  06/23/23 160 lb (72.6 kg)  05/07/23 171 lb 12.8 oz (77.9 kg)     CBC    Component Value Date/Time   WBC 4.9 03/31/2023 0933   RBC 4.54 03/31/2023 0933   HGB 13.9 03/31/2023 0933   HGB 13.6 07/18/2019 1113   HCT 41.3 03/31/2023 0933   HCT 40.8 07/18/2019 1113   PLT 271.0 03/31/2023  0933   PLT 295 07/18/2019 1113   MCV 91.0 03/31/2023 0933   MCV 92 07/18/2019 1113   MCH 30.9 10/30/2022 1534   MCHC 33.6 03/31/2023 0933   RDW 14.1 03/31/2023 0933   RDW 12.9 07/18/2019 1113   LYMPHSABS 1.5 03/31/2023 0933   LYMPHSABS 2.2 07/18/2019 1113   MONOABS 0.5 03/31/2023 0933   EOSABS 0.3 03/31/2023 0933   EOSABS 0.3 07/18/2019 1113   BASOSABS 0.0 03/31/2023 0933   BASOSABS 0.0 07/18/2019 1113     Chest Imaging: Lung cancer screening CT 08/20/2022: Lung RADS 2, small pulmonary nodule, atherosclerosis. The patient's images have been independently reviewed by me.    Pulmonary Functions Testing Results:    Latest Ref Rng & Units 11/25/2022    9:02 AM  PFT Results  FVC-Pre L 2.49   FVC-Predicted Pre % 61   FVC-Post L 2.64   FVC-Predicted Post % 65   Pre FEV1/FVC % % 68   Post FEV1/FCV % % 74   FEV1-Pre L 1.68   FEV1-Predicted Pre % 57   FEV1-Post L 1.96   DLCO uncorrected ml/min/mmHg 18.11   DLCO UNC% % 74   DLCO corrected ml/min/mmHg 18.66   DLCO COR %Predicted % 77   DLVA Predicted % 93   TLC L 5.50   TLC % Predicted % 80   RV % Predicted % 96     FeNO:   Pathology:   Echocardiogram:   Heart Catheterization:     Assessment & Plan:     ICD-10-CM   1. COPD  GOLD II/ Group A  J44.9 azithromycin (ZITHROMAX) 250 MG tablet    2. Former smoker  Z87.891     3. Screening for malignant neoplasm of respiratory organ  Z12.2     4. Medication management  Z79.899 azithromycin (ZITHROMAX) 250 MG tablet       Discussion:  This is a 77 year old gentleman, has stage II COPD on triple therapy inhaler regimen.  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  Currently on azithromycin Monday Wednesday Friday due to her chronic bronchitis symptoms.  Plan: He has not been hospitalized and doing well with his current regimen I think we should continue on it. Continue azithromycin 250 mg Monday Wednesday Friday Switch from Trelegy to [place] Given samples today  of Breztri and a new prescription. Hopefully he will be able to use the MDI better than the DPI. We may need to complete prior authorization for approval. Continue albuterol as needed.  Return to clinic in 6 months or as needed to see me or APP.   Current Outpatient Medications:    acyclovir (ZOVIRAX) 200 MG capsule, Take 200 mg by mouth 4 (four) times daily., Disp: , Rfl:    albuterol (PROVENTIL) (2.5 MG/3ML) 0.083% nebulizer solution, Take 3 mLs (2.5 mg total) by nebulization every 6 (six) hours as needed for wheezing or shortness of breath., Disp: 75 mL, Rfl: 12   Budeson-Glycopyrrol-Formoterol (BREZTRI AEROSPHERE) 160-9-4.8 MCG/ACT AERO, Inhale 2 puffs into the lungs in the morning  and at bedtime., Disp: 10.7 g, Rfl: 6   Cholecalciferol (VITAMIN D3 PO), Take by mouth daily. Take one pill daily, Disp: , Rfl:    clobetasol cream (TEMOVATE) 0.05 %, Apply 1 Application topically 2 (two) times daily., Disp: 30 g, Rfl: 0   Eszopiclone 3 MG TABS, Take 1 tablet (3 mg total) by mouth at bedtime. Take immediately before bedtime as needed., Disp: 30 tablet, Rfl: 3   METAMUCIL FIBER PO, Take by mouth as needed., Disp: , Rfl:    Multiple Vitamin (MULTIVITAMIN ADULT PO), Take by mouth daily. Take tablet daily, Disp: , Rfl:    PREVIDENT 5000 BOOSTER PLUS 1.1 % PSTE, SMARTSIG:Sparingly To Teeth, Disp: , Rfl:    rosuvastatin (CRESTOR) 10 MG tablet, TAKE 1 TABLET(10 MG) BY MOUTH DAILY, Disp: 90 tablet, Rfl: 3   tamsulosin (FLOMAX) 0.4 MG CAPS capsule, Take 1 capsule (0.4 mg total) by mouth daily., Disp: 90 capsule, Rfl: 3   TRELEGY ELLIPTA 100-62.5-25 MCG/ACT AEPB, INHALE 1 PUFF INTO THE LUNGS EVERY DAY (Patient taking differently: Take 1 puff by mouth daily as needed. Patient uses prn), Disp: 60 each, Rfl: 3   VENTOLIN HFA 108 (90 Base) MCG/ACT inhaler, INHALE 1 TO 2 PUFFS INTO THE LUNGS EVERY 6 HOURS AS NEEDED FOR WHEEZING OR SHORTNESS OF BREATH, Disp: 18 g, Rfl: 1   azithromycin (ZITHROMAX) 250 MG tablet,  Take 1 tablet (250 mg total) by mouth every Monday, Wednesday, and Friday., Disp: 36 tablet, Rfl: 3   SHINGRIX injection, Inject 0.5 mLs into the muscle once. (Patient not taking: Reported on 07/23/2023), Disp: , Rfl:    Josephine Igo, DO [facility] 07/23/2023 11:51 AM

## 2023-07-23 NOTE — Patient Instructions (Addendum)
Thank you for visiting Dr. Tonia Brooms at [facility]. Today we recommend the following:  Samples of Breztri today  Keep LDCT appt in November    Meds ordered this encounter  Medications   azithromycin (ZITHROMAX) 250 MG tablet    Sig: Take 1 tablet (250 mg total) by mouth every Monday, Wednesday, and Friday.    Dispense:  36 tablet    Refill:  3   Budeson-Glycopyrrol-Formoterol (BREZTRI AEROSPHERE) 160-9-4.8 MCG/ACT AERO    Sig: Inhale 2 puffs into the lungs in the morning and at bedtime.    Dispense:  10.7 g    Refill:  6    Return in about 6 months (around 01/21/2024) for with APP or Dr. Tonia Brooms.    Please do your part to reduce the spread of COVID-19.

## 2023-07-23 NOTE — Addendum Note (Signed)
Addended byClyda Greener M on: 07/23/2023 12:02 PM   Modules accepted: Orders

## 2023-07-26 ENCOUNTER — Other Ambulatory Visit (HOSPITAL_COMMUNITY): Payer: Self-pay

## 2023-07-26 ENCOUNTER — Telehealth: Payer: Self-pay

## 2023-07-26 NOTE — Telephone Encounter (Signed)
*  Pulm  Pharmacy Patient Advocate Encounter   Received notification from CoverMyMeds that prior authorization for Breztri Aerosphere 160-9-4.8MCG/ACT aerosol  is required/requested.   Insurance verification completed.   The patient is insured through [facility] .   Per test claim: PA required; PA submitted to [facility] via CoverMyMeds Key/confirmation #/EOC [id number] Status is pending

## 2023-07-30 ENCOUNTER — Other Ambulatory Visit (HOSPITAL_COMMUNITY): Payer: Self-pay

## 2023-07-30 NOTE — Telephone Encounter (Signed)
Pharmacy Patient Advocate Encounter  Received notification from [facility] that Prior Authorization for [facility] Aerosphere 160-9-4.8MCG/ACT aerosol  has been APPROVED from 07/26/2023 to 10/04/2024. Ran test claim, Copay is $324.56. This test claim was processed through [facility]- copay amounts may vary at other pharmacies due to pharmacy/plan contracts, or as the patient moves through the different stages of their insurance plan.  *test claims show patient is in the initial benefit phase, contributing to the cost of the medication at this time

## 2023-08-23 ENCOUNTER — Inpatient Hospital Stay
Admission: RE | Admit: 2023-08-23 | Discharge: 2023-08-23 | Disposition: A | Discharge status: Home / Self Care | Payer: Medicare Other | Source: Ambulatory Visit | Attending: Emergency Medicine

## 2023-08-23 DIAGNOSIS — Z87891 Personal history of nicotine dependence: Secondary | ICD-10-CM

## 2023-08-23 DIAGNOSIS — Z122 Encounter for screening for malignant neoplasm of respiratory organs: Secondary | ICD-10-CM

## 2023-08-26 ENCOUNTER — Telehealth: Payer: Self-pay | Admitting: Pulmonary Disease

## 2023-08-26 MED ORDER — PREDNISONE 10 MG PO TABS: ORAL_TABLET | ORAL | 0 refills | Status: DC

## 2023-08-26 MED ORDER — DOXYCYCLINE HYCLATE 100 MG PO TABS: 100.0000 mg | ORAL_TABLET | Freq: Two times a day (BID) | ORAL | 0 refills | Status: DC

## 2023-08-26 MED ORDER — PREDNISONE 10 MG PO TABS: ORAL_TABLET | ORAL | 0 refills | Status: AC

## 2023-08-26 NOTE — Telephone Encounter (Signed)
Dr.Icard can you please advise how you want the Prednisone Taper sent into pharmacy  4x2 3x2 2x2 1x2  Dr.Icard can you please advise.  Thank you

## 2023-08-26 NOTE — Telephone Encounter (Signed)
Pharmacy received a script for 26 tablets but needs 40 tablets to complete the prednisone taper with the way the instruction has been written

## 2023-08-26 NOTE — Telephone Encounter (Signed)
Let patient know I have sent in script for Doxycycline and prednisone. Patient's voice was understanding.Nothing else further needed.

## 2023-08-26 NOTE — Telephone Encounter (Signed)
Spoke with patient regarding prior message. Patient stated he has been SOB 0 fever and coughing with headaches for 1 week .   Patient has tried Doxycycline and prednisone int he past and that helped .   Patient was scheduled a Video visit with Dr.Alva on 08/27/2023 at 2. Patient would like to stay with Dr.Icard.  Dr.Icard can you please advise .   Thank you

## 2023-08-26 NOTE — Telephone Encounter (Signed)
Patient states having symptoms of shortness of breath, wheezing, cough and mucus.No available appointments.  Would like abx and Prednisone. Pharmacy is [facility]. Patient phone number is [phone].

## 2023-08-27 ENCOUNTER — Encounter: Payer: Self-pay | Admitting: Pulmonary Disease

## 2023-08-27 ENCOUNTER — Telehealth (HOSPITAL_BASED_OUTPATIENT_CLINIC_OR_DEPARTMENT_OTHER): Payer: Medicare Other | Admitting: Pulmonary Disease

## 2023-08-30 ENCOUNTER — Encounter: Payer: Self-pay | Admitting: Pulmonary Disease

## 2023-08-31 ENCOUNTER — Encounter: Payer: Self-pay | Admitting: Pulmonary Disease

## 2023-08-31 ENCOUNTER — Other Ambulatory Visit: Payer: Self-pay

## 2023-08-31 MED ORDER — PREDNISONE 10 MG PO TABS: ORAL_TABLET | ORAL | 0 refills | Status: AC

## 2023-08-31 NOTE — Telephone Encounter (Signed)
Request completed.

## 2023-08-31 NOTE — Telephone Encounter (Signed)
Request completed.

## 2023-08-31 NOTE — Telephone Encounter (Signed)
Corrected rx sent to pharmacy.

## 2023-09-20 ENCOUNTER — Encounter: Payer: Self-pay | Admitting: Emergency Medicine

## 2023-09-20 ENCOUNTER — Ambulatory Visit (INDEPENDENT_AMBULATORY_CARE_PROVIDER_SITE_OTHER): Payer: Medicare Other | Admitting: Emergency Medicine

## 2023-09-20 VITALS — BP 112/72 | HR 61 | Temp 98.7°F | Ht 69.0 in | Wt 172.0 lb

## 2023-09-20 DIAGNOSIS — R21 Rash and other nonspecific skin eruption: Secondary | ICD-10-CM

## 2023-09-20 DIAGNOSIS — I2510 Atherosclerotic heart disease of native coronary artery without angina pectoris: Secondary | ICD-10-CM | POA: Diagnosis not present

## 2023-09-20 DIAGNOSIS — E785 Hyperlipidemia, unspecified: Secondary | ICD-10-CM | POA: Diagnosis not present

## 2023-09-20 DIAGNOSIS — I700 Atherosclerosis of aorta: Secondary | ICD-10-CM | POA: Diagnosis not present

## 2023-09-20 DIAGNOSIS — J449 Chronic obstructive pulmonary disease, unspecified: Secondary | ICD-10-CM

## 2023-09-20 LAB — CBC WITH DIFFERENTIAL/PLATELET
Basophils Absolute: 0 10*3/uL (ref 0.0–0.1)
Basophils Relative: 0.5 % (ref 0.0–3.0)
Eosinophils Absolute: 0.3 10*3/uL (ref 0.0–0.7)
Eosinophils Relative: 6 % — ABNORMAL HIGH (ref 0.0–5.0)
HCT: 41.1 % (ref 39.0–52.0)
Hemoglobin: 14 g/dL (ref 13.0–17.0)
Lymphocytes Relative: 34.5 % (ref 12.0–46.0)
Lymphs Abs: 2 10*3/uL (ref 0.7–4.0)
MCHC: 34.2 g/dL (ref 30.0–36.0)
MCV: 93.6 fL (ref 78.0–100.0)
Monocytes Absolute: 0.5 10*3/uL (ref 0.1–1.0)
Monocytes Relative: 9.6 % (ref 3.0–12.0)
Neutro Abs: 2.8 10*3/uL (ref 1.4–7.7)
Neutrophils Relative %: 49.4 % (ref 43.0–77.0)
Platelets: 243 10*3/uL (ref 150.0–400.0)
RBC: 4.39 Mil/uL (ref 4.22–5.81)
RDW: 13.9 % (ref 11.5–15.5)
WBC: 5.6 10*3/uL (ref 4.0–10.5)

## 2023-09-20 LAB — COMPREHENSIVE METABOLIC PANEL
ALT: 26 U/L (ref 0–53)
AST: 16 U/L (ref 0–37)
Albumin: 4.2 g/dL (ref 3.5–5.2)
Alkaline Phosphatase: 50 U/L (ref 39–117)
BUN: 13 mg/dL (ref 6–23)
CO2: 30 meq/L (ref 19–32)
Calcium: 8.6 mg/dL (ref 8.4–10.5)
Chloride: 102 meq/L (ref 96–112)
Creatinine, Ser: 1.11 mg/dL (ref 0.40–1.50)
GFR: 64.27 mL/min (ref >60.00)
Glucose, Bld: 94 mg/dL (ref 70–99)
Potassium: 4.1 meq/L (ref 3.5–5.1)
Sodium: 139 meq/L (ref 135–145)
Total Bilirubin: 0.6 mg/dL (ref 0.2–1.2)
Total Protein: 6.9 g/dL (ref 6.0–8.3)

## 2023-09-20 LAB — LIPID PANEL
Cholesterol: 166 mg/dL (ref 0–200)
HDL: 54.6 mg/dL (ref >39.00)
LDL Cholesterol: 87 mg/dL (ref 0–99)
NonHDL: 110.92
Total CHOL/HDL Ratio: 3
Triglycerides: 119 mg/dL (ref 0.0–149.0)
VLDL: 23.8 mg/dL (ref 0.0–40.0)

## 2023-09-20 NOTE — Assessment & Plan Note (Signed)
Stable chronic condition.  Had a recent flareup with acute bronchitis.  Much better today Continues Breztri 2 puffs twice a day and albuterol as rescue inhaler.

## 2023-09-20 NOTE — Progress Notes (Signed)
Kevin Shepard 77 y.o.   Chief Complaint  Patient presents with   Medical Management of Chronic Issues    6 month follow up, spot on his skin and scalp issue     HISTORY OF PRESENT ILLNESS: This is a 77 y.o. male here for 45-month follow-up of chronic medical conditions Overall doing well. Needs referral for dermatologist No other complaints or medical concerns today.  HPI   Prior to Admission medications   Medication Sig Start Date End Date Taking? Authorizing Provider  acyclovir (ZOVIRAX) 200 MG capsule Take 200 mg by mouth 4 (four) times daily. 06/18/23  Yes [provider]  albuterol (PROVENTIL) (2.5 MG/3ML) 0.083% nebulizer solution Take 3 mLs (2.5 mg total) by nebulization every 6 (six) hours as needed for wheezing or shortness of breath. 01/25/23  Yes Cobb, Ruby Cola, NP  Budeson-Glycopyrrol-Formoterol (BREZTRI AEROSPHERE) 160-9-4.8 MCG/ACT AERO Inhale 2 puffs into the lungs in the morning and at bedtime. 07/23/23 10/20/23 Yes Icard, Rachel Bo, DO  Budeson-Glycopyrrol-Formoterol (BREZTRI AEROSPHERE) 160-9-4.8 MCG/ACT AERO Inhale 2 puffs into the lungs in the morning and at bedtime. 07/23/23  Yes Icard, Rachel Bo, DO  Cholecalciferol (VITAMIN D3 PO) Take by mouth daily. Take one pill daily   Yes [provider]  clobetasol cream (TEMOVATE) 0.05 % Apply 1 Application topically 2 (two) times daily. 03/31/23  Yes Stryker Veasey, Eilleen Kempf, MD  doxycycline (VIBRA-TABS) 100 MG tablet Take 1 tablet (100 mg total) by mouth 2 (two) times daily. 08/26/23  Yes Icard, Bradley L, DO  Eszopiclone 3 MG TABS Take 1 tablet (3 mg total) by mouth at bedtime. Take immediately before bedtime as needed. 03/31/23  Yes Ayodeji Keimig, Eilleen Kempf, MD  METAMUCIL FIBER PO Take by mouth as needed.   Yes [provider]  Multiple Vitamin (MULTIVITAMIN ADULT PO) Take by mouth daily. Take tablet daily   Yes [provider]  PREVIDENT 5000 BOOSTER PLUS 1.1 % PSTE SMARTSIG:Sparingly To  Teeth 05/14/21  Yes [provider]  rosuvastatin (CRESTOR) 10 MG tablet TAKE 1 TABLET(10 MG) BY MOUTH DAILY 02/22/23  Yes Toben Acuna, [place], MD  [facility] injection Inject 0.5 mLs into the muscle once. 06/14/23  Yes [provider]  tamsulosin (FLOMAX) 0.4 MG CAPS capsule Take 1 capsule (0.4 mg total) by mouth daily. 09/15/22  Yes Kacee Koren, Eilleen Kempf, MD  TRELEGY ELLIPTA 100-62.5-25 MCG/ACT AEPB INHALE 1 PUFF INTO THE LUNGS EVERY DAY Patient taking differently: Take 1 puff by mouth daily as needed. Patient uses prn 02/23/23  Yes Cobb, Ruby Cola, NP  VENTOLIN HFA 108 (90 Base) MCG/ACT inhaler INHALE 1 TO 2 PUFFS INTO THE LUNGS EVERY 6 HOURS AS NEEDED FOR WHEEZING OR SHORTNESS OF BREATH 05/04/23  Yes Icard, Rachel Bo, DO    No Known Allergies  Patient Active Problem List   Diagnosis Date Noted   Chronic constipation 02/10/2022   Lower urinary tract symptoms 02/10/2022   Dyslipidemia 01/29/2021   Atherosclerosis of aorta (HCC) 01/29/2021   Insomnia 01/29/2021   Atherosclerosis of native coronary artery of native heart without angina pectoris 01/29/2021   Rash and nonspecific skin eruption 10/17/2020   Seborrheic keratoses 04/11/2020   Actinic keratoses 04/11/2020   COPD  GOLD II/ Group A 06/02/2018   Left upper lobe pulmonary nodule 06/02/2018   Former smoker 04/06/2018   Alcohol use 01/10/2018   Chronic bilateral low back pain without sciatica 01/10/2018   History of tobacco use 01/10/2018   History of back surgery 01/10/2018    Past Medical  History:  Diagnosis Date   Blood transfusion without reported diagnosis    Cataract    beginning   Colon polyps    COPD (chronic obstructive pulmonary disease) (HCC)    Hyperlipidemia     Past Surgical History:  Procedure Laterality Date   APPENDECTOMY     per patient 1950's   COLONOSCOPY     Last 02/19/2017 [facility] an 8 mm polyps rectum and hepatic flexure with history of polyps prior    FRACTURE SURGERY  1974   right clavicle   LUMBAR SPINE SURGERY  2018   SPINE SURGERY  10/2016   TONSILLECTOMY      Social History   Socioeconomic History   Marital status: Married    Spouse name: Not on file   Number of children: Not on file   Years of education: Not on file   Highest education level: Not on file  Occupational History   Occupation: stock market trading  Tobacco Use   Smoking status: Former    Current packs/day: 0.00    Average packs/day: 1 pack/day for 40.0 years (40.0 ttl pk-yrs)    Types: Cigarettes    Start date: 10/04/1977    Quit date: 10/04/2017    Years since quitting: 5.9    Passive exposure: Past   Smokeless tobacco: Never  Vaping Use   Vaping status: Never Used  Substance and Sexual Activity   Alcohol use: Yes    Comment: 2-3 days/week   Drug use: Never   Sexual activity: Yes    Partners: Female  Other Topics Concern   Not on file  Social History Narrative   Not on file   Social Drivers of Health   Financial Resource Strain: Low Risk  (06/23/2023)   Overall Financial Resource Strain (CARDIA)    Difficulty of Paying Living Expenses: Not hard at all  Food Insecurity: No Food Insecurity (06/23/2023)   Hunger Vital Sign    Worried About Running Out of Food in the Last Year: Never true    Ran Out of Food in the Last Year: Never true  Transportation Needs: No Transportation Needs (06/23/2023)   PRAPARE - Administrator, Civil Service (Medical): No    Lack of Transportation (Non-Medical): No  Physical Activity: Sufficiently Active (06/23/2023)   Exercise Vital Sign    Days of Exercise per Week: 7 days    Minutes of Exercise per Session: 50 min  Stress: No Stress Concern Present (06/23/2023)   [employer] of Occupational Health - Occupational Stress Questionnaire    Feeling of Stress : Not at all  Social Connections: Moderately Isolated (06/23/2023)   Social Connection and Isolation Panel [NHANES]    Frequency of  Communication with Friends and Family: Three times a week    Frequency of Social Gatherings with Friends and Family: Three times a week    Attends Religious Services: Never    Active Member of Clubs or Organizations: No    Attends Banker Meetings: Never    Marital Status: Married  Catering manager Violence: Not At Risk (06/23/2023)   Humiliation, Afraid, Rape, and Kick questionnaire    Fear of Current or Ex-Partner: No    Emotionally Abused: No    Physically Abused: No    Sexually Abused: No    Family History  Problem Relation Age of Onset   Cancer Mother        Breast   Early death Father    Heart disease  Father 65       rheumatic heart disease   Hyperlipidemia Sister    Colon cancer Neg Hx    Colon polyps Neg Hx    Crohn's disease Neg Hx    Esophageal cancer Neg Hx    Rectal cancer Neg Hx    Stomach cancer Neg Hx    Ulcerative colitis Neg Hx      Review of Systems  Constitutional: Negative.  Negative for chills and fever.  HENT: Negative.  Negative for congestion and sore throat.   Respiratory: Negative.  Negative for cough and shortness of breath.   Cardiovascular: Negative.  Negative for chest pain and palpitations.  Gastrointestinal:  Negative for abdominal pain, diarrhea, nausea and vomiting.  Genitourinary: Negative.  Negative for dysuria and hematuria.  Skin:  Positive for rash.  Neurological: Negative.  Negative for dizziness and headaches.  All other systems reviewed and are negative.   Vitals:   09/20/23 0804  BP: 112/72  Pulse: 61  Temp: 98.7 F (37.1 C)  SpO2: 94%    Physical Exam Vitals reviewed.  Constitutional:      Appearance: Normal appearance.  HENT:     Head: Normocephalic.     Mouth/Throat:     Mouth: Mucous membranes are moist.     Pharynx: Oropharynx is clear.  Eyes:     Extraocular Movements: Extraocular movements intact.     Pupils: Pupils are equal, round, and reactive to light.  Cardiovascular:     Rate and  Rhythm: Normal rate and regular rhythm.     Pulses: Normal pulses.     Heart sounds: Normal heart sounds.  Pulmonary:     Effort: Pulmonary effort is normal.     Breath sounds: Normal breath sounds.  Musculoskeletal:     Cervical back: No tenderness.  Lymphadenopathy:     Cervical: No cervical adenopathy.  Skin:    General: Skin is warm and dry.     Capillary Refill: Capillary refill takes less than 2 seconds.     Findings: Rash (Scalp rash) present.  Neurological:     General: No focal deficit present.     Mental Status: He is alert and oriented to person, place, and time.  Psychiatric:        Mood and Affect: Mood normal.        Behavior: Behavior normal.      ASSESSMENT & PLAN: A total of 44 minutes was spent with the patient and counseling/coordination of care regarding preparing for this visit, review of most recent office visit notes, review of multiple chronic medical conditions and their management, review of all medications, review of most recent bloodwork results, review of health maintenance items, education on nutrition, prognosis, documentation, and need for follow up.   Problem List Items Addressed This Visit       Cardiovascular and Mediastinum   Atherosclerosis of aorta (HCC)   Diet and nutrition discussed Continue rosuvastatin 10 mg daily      Relevant Orders   CBC with Differential/Platelet   Comprehensive metabolic panel   Lipid panel   Atherosclerosis of native coronary artery of native heart without angina pectoris   No recent anginal episodes Stable chronic condition      Relevant Orders   CBC with Differential/Platelet   Comprehensive metabolic panel   Lipid panel     Respiratory   COPD  GOLD II/ Group A - Primary   Stable chronic condition.  Had a recent flareup with acute bronchitis.  Much better today Continues Breztri 2 puffs twice a day and albuterol as rescue inhaler.      Relevant Orders   CBC with Differential/Platelet    Comprehensive metabolic panel   Lipid panel     Musculoskeletal and Integument   Rash and nonspecific skin eruption   Relevant Orders   Ambulatory referral to Dermatology   CBC with Differential/Platelet     Other   Dyslipidemia   Diet and nutrition discussed Continue rosuvastatin 10 mg daily Lipid profile done today      Relevant Orders   CBC with Differential/Platelet   Comprehensive metabolic panel   Lipid panel     Patient Instructions  Health Maintenance After Age 48 After age 86, you are at a higher risk for certain long-term diseases and infections as well as injuries from falls. Falls are a major cause of broken bones and head injuries in people who are older than age 11. Getting regular preventive care can help to keep you healthy and well. Preventive care includes getting regular testing and making lifestyle changes as recommended by your health care provider. Talk with your health care provider about: Which screenings and tests you should have. A screening is a test that checks for a disease when you have no symptoms. A diet and exercise plan that is right for you. What should I know about screenings and tests to prevent falls? Screening and testing are the best ways to find a health problem early. Early diagnosis and treatment give you the best chance of managing medical conditions that are common after age 30. Certain conditions and lifestyle choices may make you more likely to have a fall. Your health care provider may recommend: Regular vision checks. Poor vision and conditions such as cataracts can make you more likely to have a fall. If you wear glasses, make sure to get your prescription updated if your vision changes. Medicine review. Work with your health care provider to regularly review all of the medicines you are taking, including over-the-counter medicines. Ask your health care provider about any side effects that may make you more likely to have a fall. Tell  your health care provider if any medicines that you take make you feel dizzy or sleepy. Strength and balance checks. Your health care provider may recommend certain tests to check your strength and balance while standing, walking, or changing positions. Foot health exam. Foot pain and numbness, as well as not wearing proper footwear, can make you more likely to have a fall. Screenings, including: Osteoporosis screening. Osteoporosis is a condition that causes the bones to get weaker and break more easily. Blood pressure screening. Blood pressure changes and medicines to control blood pressure can make you feel dizzy. Depression screening. You may be more likely to have a fall if you have a fear of falling, feel depressed, or feel unable to do activities that you used to do. Alcohol use screening. Using too much alcohol can affect your balance and may make you more likely to have a fall. Follow these instructions at home: Lifestyle Do not drink alcohol if: Your health care provider tells you not to drink. If you drink alcohol: Limit how much you have to: 0-1 drink a day for women. 0-2 drinks a day for men. Know how much alcohol is in your drink. In the [place] one drink equals one 12 oz bottle of beer (355 mL), one 5 oz glass of wine (148 mL), or one 1 oz glass of hard liquor (  44 mL). Do not use any products that contain nicotine or tobacco. These products include cigarettes, chewing tobacco, and vaping devices, such as e-cigarettes. If you need help quitting, ask your health care provider. Activity  Follow a regular exercise program to stay fit. This will help you maintain your balance. Ask your health care provider what types of exercise are appropriate for you. If you need a cane or walker, use it as recommended by your health care provider. Wear supportive shoes that have nonskid soles. Safety  Remove any tripping hazards, such as rugs, cords, and clutter. Install safety equipment such as  grab bars in bathrooms and safety rails on stairs. Keep rooms and walkways well-lit. General instructions Talk with your health care provider about your risks for falling. Tell your health care provider if: You fall. Be sure to tell your health care provider about all falls, even ones that seem minor. You feel dizzy, tiredness (fatigue), or off-balance. Take over-the-counter and prescription medicines only as told by your health care provider. These include supplements. Eat a healthy diet and maintain a healthy weight. A healthy diet includes low-fat dairy products, low-fat (lean) meats, and fiber from whole grains, beans, and lots of fruits and vegetables. Stay current with your vaccines. Schedule regular health, dental, and eye exams. Summary Having a healthy lifestyle and getting preventive care can help to protect your health and wellness after age 7. Screening and testing are the best way to find a health problem early and help you avoid having a fall. Early diagnosis and treatment give you the best chance for managing medical conditions that are more common for people who are older than age 97. Falls are a major cause of broken bones and head injuries in people who are older than age 10. Take precautions to prevent a fall at home. Work with your health care provider to learn what changes you can make to improve your health and wellness and to prevent falls. This information is not intended to replace advice given to you by your health care provider. Make sure you discuss any questions you have with your health care provider. Document Revised: 02/10/2021 Document Reviewed: 02/10/2021 Elsevier Patient Education  2024 Elsevier Inc.      Edwina Barth, MD [facility]

## 2023-09-20 NOTE — Assessment & Plan Note (Signed)
Diet and nutrition discussed. Continue rosuvastatin 10 mg daily.  

## 2023-09-20 NOTE — Assessment & Plan Note (Signed)
No recent anginal episodes Stable chronic condition

## 2023-09-20 NOTE — Assessment & Plan Note (Signed)
Diet and nutrition discussed.  Continue rosuvastatin 10 mg daily. Lipid profile done today.

## 2023-09-20 NOTE — Patient Instructions (Signed)
Health Maintenance After Age 77 After age 77, you are at a higher risk for certain long-term diseases and infections as well as injuries from falls. Falls are a major cause of broken bones and head injuries in people who are older than age 77. Getting regular preventive care can help to keep you healthy and well. Preventive care includes getting regular testing and making lifestyle changes as recommended by your health care provider. Talk with your health care provider about: Which screenings and tests you should have. A screening is a test that checks for a disease when you have no symptoms. A diet and exercise plan that is right for you. What should I know about screenings and tests to prevent falls? Screening and testing are the best ways to find a health problem early. Early diagnosis and treatment give you the best chance of managing medical conditions that are common after age 77. Certain conditions and lifestyle choices may make you more likely to have a fall. Your health care provider may recommend: Regular vision checks. Poor vision and conditions such as cataracts can make you more likely to have a fall. If you wear glasses, make sure to get your prescription updated if your vision changes. Medicine review. Work with your health care provider to regularly review all of the medicines you are taking, including over-the-counter medicines. Ask your health care provider about any side effects that may make you more likely to have a fall. Tell your health care provider if any medicines that you take make you feel dizzy or sleepy. Strength and balance checks. Your health care provider may recommend certain tests to check your strength and balance while standing, walking, or changing positions. Foot health exam. Foot pain and numbness, as well as not wearing proper footwear, can make you more likely to have a fall. Screenings, including: Osteoporosis screening. Osteoporosis is a condition that causes  the bones to get weaker and break more easily. Blood pressure screening. Blood pressure changes and medicines to control blood pressure can make you feel dizzy. Depression screening. You may be more likely to have a fall if you have a fear of falling, feel depressed, or feel unable to do activities that you used to do. Alcohol use screening. Using too much alcohol can affect your balance and may make you more likely to have a fall. Follow these instructions at home: Lifestyle Do not drink alcohol if: Your health care provider tells you not to drink. If you drink alcohol: Limit how much you have to: 0-1 drink a day for women. 0-2 drinks a day for men. Know how much alcohol is in your drink. In the [place] one drink equals one 12 oz bottle of beer (355 mL), one 5 oz glass of wine (148 mL), or one 1 oz glass of hard liquor (44 mL). Do not use any products that contain nicotine or tobacco. These products include cigarettes, chewing tobacco, and vaping devices, such as e-cigarettes. If you need help quitting, ask your health care provider. Activity  Follow a regular exercise program to stay fit. This will help you maintain your balance. Ask your health care provider what types of exercise are appropriate for you. If you need a cane or walker, use it as recommended by your health care provider. Wear supportive shoes that have nonskid soles. Safety  Remove any tripping hazards, such as rugs, cords, and clutter. Install safety equipment such as grab bars in bathrooms and safety rails on stairs. Keep rooms and walkways   well-lit. General instructions Talk with your health care provider about your risks for falling. Tell your health care provider if: You fall. Be sure to tell your health care provider about all falls, even ones that seem minor. You feel dizzy, tiredness (fatigue), or off-balance. Take over-the-counter and prescription medicines only as told by your health care provider. These include  supplements. Eat a healthy diet and maintain a healthy weight. A healthy diet includes low-fat dairy products, low-fat (lean) meats, and fiber from whole grains, beans, and lots of fruits and vegetables. Stay current with your vaccines. Schedule regular health, dental, and eye exams. Summary Having a healthy lifestyle and getting preventive care can help to protect your health and wellness after age 77. Screening and testing are the best way to find a health problem early and help you avoid having a fall. Early diagnosis and treatment give you the best chance for managing medical conditions that are more common for people who are older than age 77. Falls are a major cause of broken bones and head injuries in people who are older than age 77. Take precautions to prevent a fall at home. Work with your health care provider to learn what changes you can make to improve your health and wellness and to prevent falls. This information is not intended to replace advice given to you by your health care provider. Make sure you discuss any questions you have with your health care provider. Document Revised: 02/10/2021 Document Reviewed: 02/10/2021 Elsevier Patient Education  2024 Elsevier Inc.  

## 2023-10-07 ENCOUNTER — Telehealth: Payer: Self-pay | Admitting: Acute Care

## 2023-10-07 NOTE — Telephone Encounter (Signed)
 Patient had LDCT in 08/23/2023 that resulted as a LR 2. Nodule has grown from 9.20mm to 10.53mm in 12 month period. Per Lauraine Lites NP patient will need 6 month follow up scan, due 02/21/2024. Because patient is currently 78 years old and will be 58 when next annual scan is due patient will have aged out per insurance guidelines. Lauraine Lites NP recommends the patient follow up with PCP when it is time to resume annual scans with CT chest WO to keep an eye on the nodules. Patient already on statin therapy for CAD.   IMPRESSION: 1. Lung-RADS 2, benign appearance or behavior. Continue annual screening with low-dose chest CT without contrast in 12 months. 2. Two-vessel coronary atherosclerosis. 3. Aortic Atherosclerosis (ICD10-I70.0) and Emphysema (ICD10-J43.9).     Electronically Signed   By: Selinda DELENA Blue M.D.   On: 09/14/2023 13:22

## 2023-10-08 NOTE — Telephone Encounter (Signed)
 Called patient. LVM to call office and review results.

## 2023-10-11 ENCOUNTER — Other Ambulatory Visit: Payer: Self-pay

## 2023-10-11 DIAGNOSIS — Z122 Encounter for screening for malignant neoplasm of respiratory organs: Secondary | ICD-10-CM

## 2023-10-11 DIAGNOSIS — Z87891 Personal history of nicotine dependence: Secondary | ICD-10-CM

## 2023-10-11 DIAGNOSIS — R911 Solitary pulmonary nodule: Secondary | ICD-10-CM

## 2023-10-11 NOTE — Telephone Encounter (Signed)
 LVM to call office and review results.

## 2023-10-11 NOTE — Telephone Encounter (Signed)
 Spoke with patient and reviewed results. All questions answered. Patient will repeat scan in 6 months. Order placed.

## 2023-10-17 DIAGNOSIS — J449 Chronic obstructive pulmonary disease, unspecified: Secondary | ICD-10-CM

## 2023-10-17 NOTE — Telephone Encounter (Signed)
 Katie, please advise. BI is unavailable and you seen patient last.

## 2023-10-18 ENCOUNTER — Other Ambulatory Visit (HOSPITAL_COMMUNITY): Payer: Self-pay

## 2023-10-18 MED ORDER — DOXYCYCLINE HYCLATE 100 MG PO TABS: 100.0000 mg | ORAL_TABLET | Freq: Two times a day (BID) | ORAL | 0 refills | Status: DC

## 2023-10-18 MED ORDER — PREDNISONE 10 MG PO TABS: ORAL_TABLET | ORAL | 0 refills | Status: DC

## 2023-10-18 NOTE — Telephone Encounter (Signed)
 Mychart sent by pt:  Kevin Shepard Octaviano SHAUNNA [facility] (supporting [place] V, NP)23 hours ago (10:15 AM)   RM You gave me a couple Breztri  Samples at my last appointment and said if my insurance doesn't cover it, you could give me a couple more samples.    My insurance doesn't cover it. Over $400 per inhaler!!   Do you have other samples I can pick up?   Kevin Shepard     Katie, please advise on this what you want to have done due to the Breztri  not being covered by pt's insurance. Do you want this to be sent to prior auth team to determine which inhalers are preferred/covered by pt's insurance or do you want to see if pt might be able to apply for patient assistance for the Breztri ?

## 2023-10-18 NOTE — Telephone Encounter (Signed)
 Noted.

## 2023-10-18 NOTE — Telephone Encounter (Signed)
 Please have pharmacy team run a test claim for inhalers. He can come get 2 samples of Breztri in interim if it works better than the [employer]. Thanks.

## 2023-10-18 NOTE — Telephone Encounter (Signed)
Routing to prior auth team

## 2023-10-18 NOTE — Telephone Encounter (Signed)
 Addressed in alternative encounter. Rx sent and pt messaged. Thanks.

## 2023-10-18 NOTE — Telephone Encounter (Signed)
 Per test claims both Trelegy and Markus Daft are covered for $47.00 copay through [employer].

## 2023-10-19 DIAGNOSIS — D485 Neoplasm of uncertain behavior of skin: Secondary | ICD-10-CM | POA: Diagnosis not present

## 2023-10-19 DIAGNOSIS — Z1283 Encounter for screening for malignant neoplasm of skin: Secondary | ICD-10-CM | POA: Diagnosis not present

## 2023-10-19 DIAGNOSIS — D225 Melanocytic nevi of trunk: Secondary | ICD-10-CM | POA: Diagnosis not present

## 2023-10-19 DIAGNOSIS — X32XXXD Exposure to sunlight, subsequent encounter: Secondary | ICD-10-CM | POA: Diagnosis not present

## 2023-10-19 DIAGNOSIS — L570 Actinic keratosis: Secondary | ICD-10-CM | POA: Diagnosis not present

## 2023-10-19 DIAGNOSIS — L820 Inflamed seborrheic keratosis: Secondary | ICD-10-CM | POA: Diagnosis not present

## 2023-10-20 ENCOUNTER — Other Ambulatory Visit: Payer: Self-pay | Admitting: Medical Genetics

## 2023-10-21 MED ORDER — BREZTRI AEROSPHERE 160-9-4.8 MCG/ACT IN AERO: 2.0000 | INHALATION_SPRAY | Freq: Two times a day (BID) | RESPIRATORY_TRACT | 2 refills | Status: AC

## 2023-10-22 ENCOUNTER — Other Ambulatory Visit (HOSPITAL_COMMUNITY)
Admission: RE | Admit: 2023-10-22 | Discharge: 2023-10-22 | Disposition: A | Discharge status: Home / Self Care | Payer: Self-pay | Source: Ambulatory Visit | Attending: Medical Genetics | Admitting: Medical Genetics

## 2023-11-04 LAB — GENECONNECT MOLECULAR SCREEN: Genetic Analysis Overall Interpretation: NEGATIVE

## 2023-11-09 ENCOUNTER — Other Ambulatory Visit: Payer: Self-pay | Admitting: Emergency Medicine

## 2023-11-09 DIAGNOSIS — G4700 Insomnia, unspecified: Secondary | ICD-10-CM

## 2023-12-03 ENCOUNTER — Other Ambulatory Visit: Payer: Self-pay | Admitting: Emergency Medicine

## 2023-12-03 DIAGNOSIS — R399 Unspecified symptoms and signs involving the genitourinary system: Secondary | ICD-10-CM

## 2024-01-26 IMAGING — CT CT CHEST LUNG CANCER SCREENING LOW DOSE W/O CM
1 of 2 series · 10 of 20 positions shown, 13 images · non-contrast
Comparison: 02/14/2021 screening chest CT.
COMPARISON: 02/14/2021 screening chest CT.

Addendum:
CLINICAL DATA: 75-year-old asymptomatic male former smoker with 36
pack-year smoking history, quit smoking in 7793.



[ct lung segmentation data · axial · 0.78mm/px · z∈[-411,-411]mm · 10 of 377 frames shown]
[frame 1/377  mediastinal]
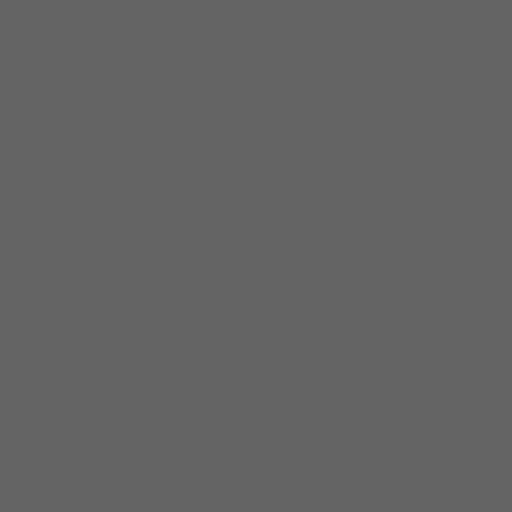
[frame 1/377  lung]
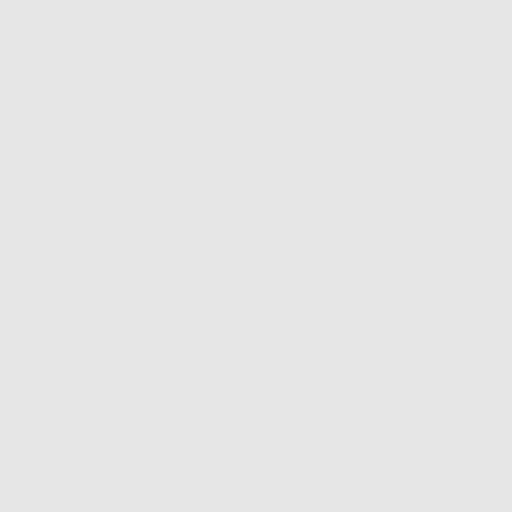
[frame 42/377  lung]
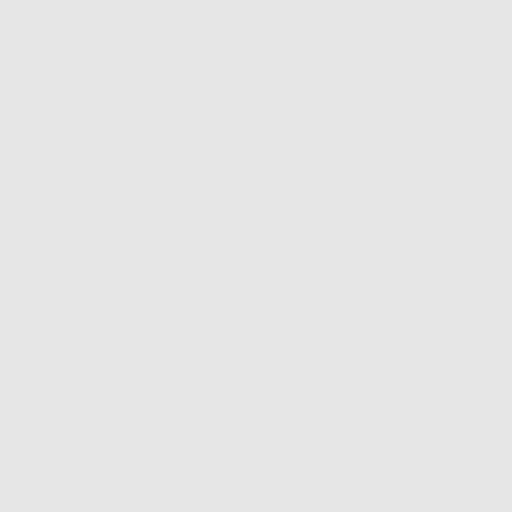
[frame 84/377  lung]
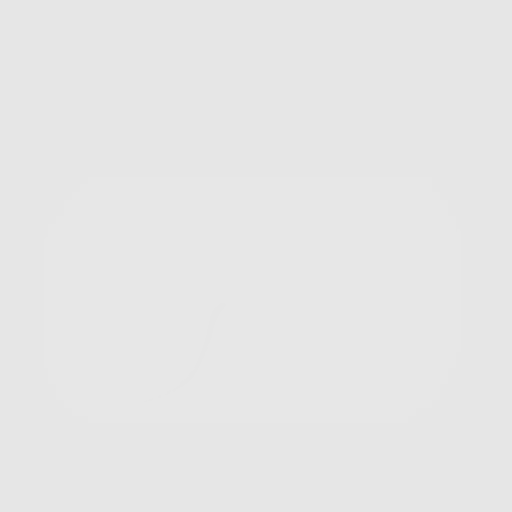
[frame 126/377  lung]
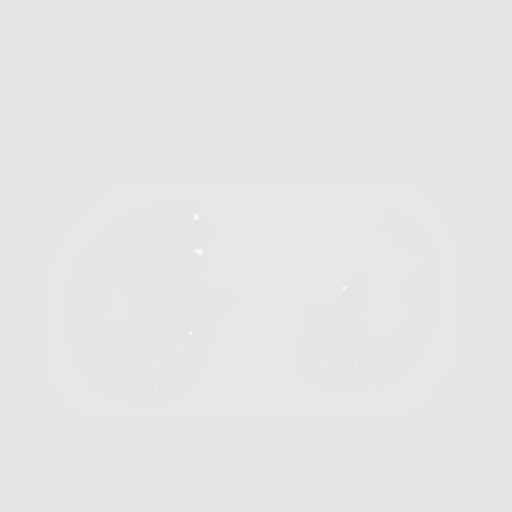
[frame 168/377  mediastinal]
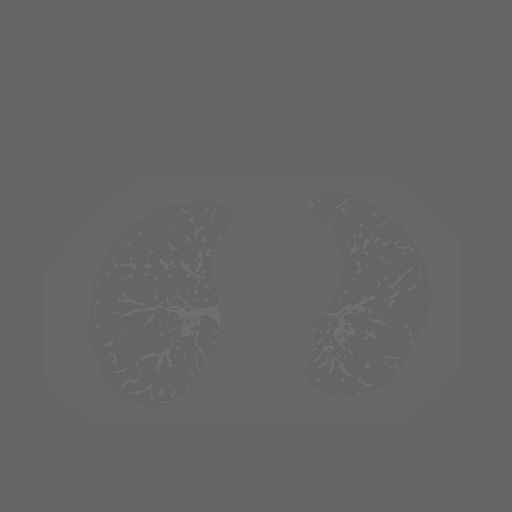
[frame 168/377  lung]
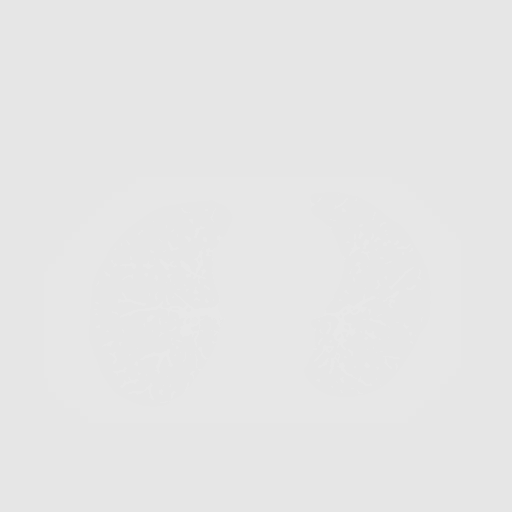
[frame 209/377  lung]
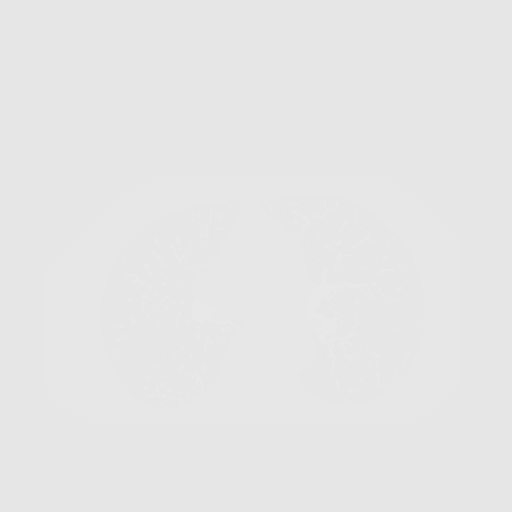
[frame 251/377  lung]
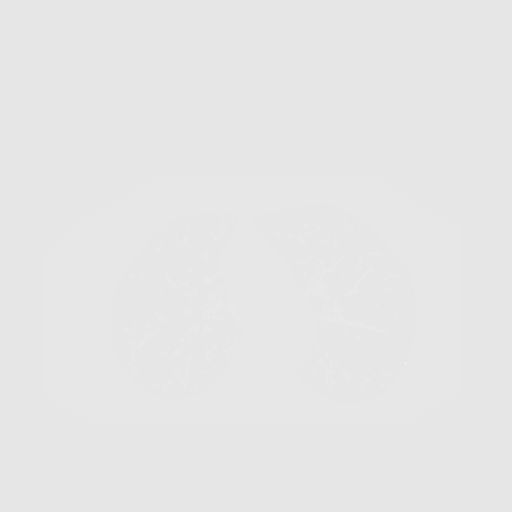
[frame 293/377  lung]
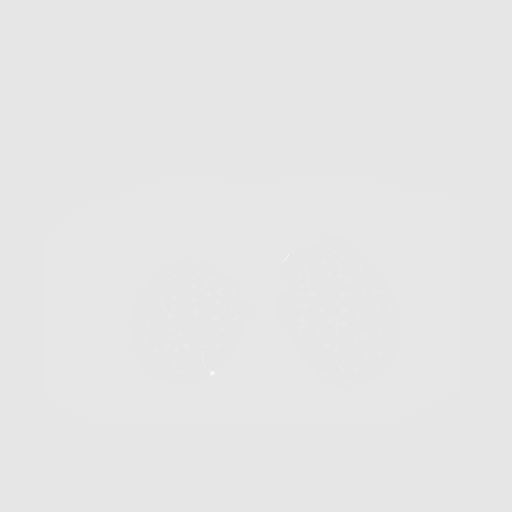
[frame 335/377  mediastinal]
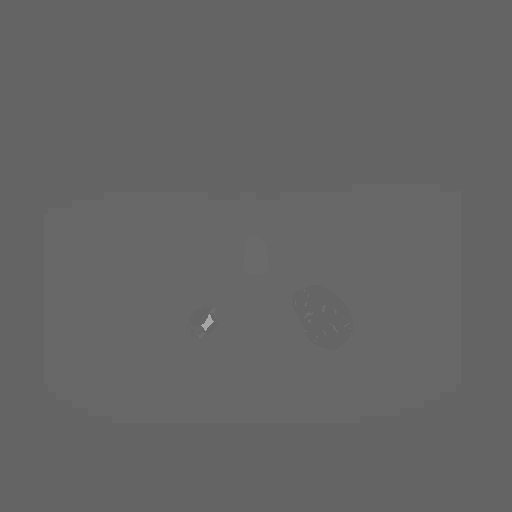
[frame 335/377  lung]
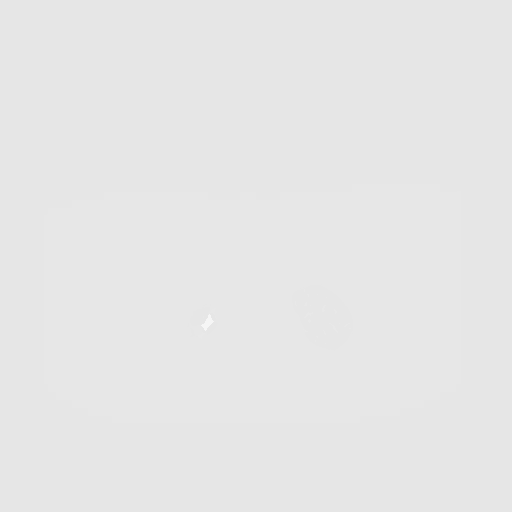
[frame 377/377  lung]
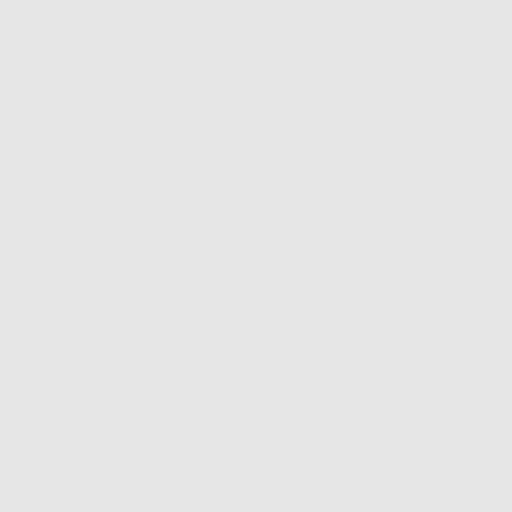

[10 of 20 positions shown; findings below may reference images not displayed]

FINDINGS: Cardiovascular: Normal heart size. No significant pericardial
effusion/thickening. Left anterior descending and left circumflex
coronary atherosclerosis. Atherosclerotic nonaneurysmal thoracic
aorta. Normal caliber pulmonary arteries.

Mediastinum/Nodes: No discrete thyroid nodules. Unremarkable
esophagus. No pathologically enlarged axillary, mediastinal or hilar
lymph nodes, noting limited sensitivity for the detection of hilar
adenopathy on this noncontrast study. Coarsely calcified nonenlarged
subcarinal and right hilar nodes, unchanged, compatible with prior
granulomatous disease.

Lungs/Pleura: No pneumothorax. No pleural effusion. Mild-to-moderate
centrilobular emphysema with diffuse bronchial wall thickening. No
acute consolidative airspace disease or lung masses. No significant
growth of previously visualized pulmonary nodules. New indistinct
right middle lobe pulmonary nodule measuring 4.3 mm in volume
derived mean diameter (series 3/image 193)

Upper abdomen: No acute abnormality.

Musculoskeletal: No aggressive appearing focal osseous lesions.
Moderate thoracic spondylosis.
IMPRESSION: 1. Lung-RADS 3, probably benign findings. Short-term follow-up in 6
months is recommended with repeat low-dose chest CT without contrast
(please use the following order, "CT CHEST LCS NODULE FOLLOW-UP W/O
CM"). New indistinct 4.3 cm right middle lobe pulmonary nodule.
2. Two-vessel coronary atherosclerosis.
3. Aortic Atherosclerosis (AA6HD-MNA.A) and Emphysema (AA6HD-26G.2).

ADDENDUM:
ADDENDED IMPRESSION:

1. Lung-RADS 3, probably benign findings. Short-term follow-up in 6
months is recommended with repeat low-dose chest CT without contrast
(please use the following order, "CT CHEST LCS NODULE FOLLOW-UP W/O
CM"). New indistinct 4.3 mm right middle lobe pulmonary nodule.
2. Two vessel coronary atherosclerosis.
3. Aortic Atherosclerosis (AA6HD-MNA.A) and Emphysema (AA6HD-26G.2).

*** End of Addendum ***
FINDINGS: Cardiovascular: Normal heart size. No significant pericardial
effusion/thickening. Left anterior descending and left circumflex
coronary atherosclerosis. Atherosclerotic nonaneurysmal thoracic
aorta. Normal caliber pulmonary arteries.

Mediastinum/Nodes: No discrete thyroid nodules. Unremarkable
esophagus. No pathologically enlarged axillary, mediastinal or hilar
lymph nodes, noting limited sensitivity for the detection of hilar
adenopathy on this noncontrast study. Coarsely calcified nonenlarged
subcarinal and right hilar nodes, unchanged, compatible with prior
granulomatous disease.

Lungs/Pleura: No pneumothorax. No pleural effusion. Mild-to-moderate
centrilobular emphysema with diffuse bronchial wall thickening. No
acute consolidative airspace disease or lung masses. No significant
growth of previously visualized pulmonary nodules. New indistinct
right middle lobe pulmonary nodule measuring 4.3 mm in volume
derived mean diameter (series 3/image 193)

Upper abdomen: No acute abnormality.

Musculoskeletal: No aggressive appearing focal osseous lesions.
Moderate thoracic spondylosis.
IMPRESSION: 1. Lung-RADS 3, probably benign findings. Short-term follow-up in 6
months is recommended with repeat low-dose chest CT without contrast
(please use the following order, "CT CHEST LCS NODULE FOLLOW-UP W/O
CM"). New indistinct 4.3 cm right middle lobe pulmonary nodule.
2. Two-vessel coronary atherosclerosis.
3. Aortic Atherosclerosis (AA6HD-MNA.A) and Emphysema (AA6HD-26G.2).

## 2024-02-21 ENCOUNTER — Ambulatory Visit
Admission: RE | Admit: 2024-02-21 | Discharge: 2024-02-21 | Disposition: A | Discharge status: Home / Self Care | Source: Ambulatory Visit | Attending: Acute Care | Admitting: Acute Care

## 2024-02-21 DIAGNOSIS — R918 Other nonspecific abnormal finding of lung field: Secondary | ICD-10-CM | POA: Diagnosis not present

## 2024-02-21 DIAGNOSIS — Z87891 Personal history of nicotine dependence: Secondary | ICD-10-CM | POA: Diagnosis not present

## 2024-02-21 DIAGNOSIS — R911 Solitary pulmonary nodule: Secondary | ICD-10-CM

## 2024-02-21 DIAGNOSIS — Z122 Encounter for screening for malignant neoplasm of respiratory organs: Secondary | ICD-10-CM

## 2024-02-23 ENCOUNTER — Other Ambulatory Visit

## 2024-02-28 ENCOUNTER — Other Ambulatory Visit: Payer: Self-pay | Admitting: Emergency Medicine

## 2024-02-28 DIAGNOSIS — G4700 Insomnia, unspecified: Secondary | ICD-10-CM

## 2024-03-14 DIAGNOSIS — L570 Actinic keratosis: Secondary | ICD-10-CM | POA: Diagnosis not present

## 2024-03-14 DIAGNOSIS — X32XXXD Exposure to sunlight, subsequent encounter: Secondary | ICD-10-CM | POA: Diagnosis not present

## 2024-03-14 DIAGNOSIS — L820 Inflamed seborrheic keratosis: Secondary | ICD-10-CM | POA: Diagnosis not present

## 2024-03-14 DIAGNOSIS — D485 Neoplasm of uncertain behavior of skin: Secondary | ICD-10-CM | POA: Diagnosis not present

## 2024-03-16 ENCOUNTER — Telehealth: Payer: Self-pay | Admitting: Acute Care

## 2024-03-16 NOTE — Telephone Encounter (Addendum)
 Spoke with patient and reviewed results. He is aware that he has aged out of the LCS program. Advised to discuss scheduling future annual Lung CTs with PCP or at his next pulmonology visit. Results and plan sent to PCP. Pt verbalized understanding. No further needs.    Katie, tagging you just in case the patient mentions this at f/u appt tomorrow.   Margretta Shi, RN

## 2024-03-16 NOTE — Telephone Encounter (Signed)
 Since the largest nodule has actually decreased in size since  the last scan, I cannot justify an additional scan. Please call the patient and give him the results. Let him know this is his last year of screening . Please send a letter to PCP and let him know that the patient has  aged out  of the program, and if he wants to continue following the lung nodule her will need to do with CT Chest without contrast through the patient insurance. Thanks so much.

## 2024-03-17 ENCOUNTER — Encounter: Payer: Self-pay | Admitting: Nurse Practitioner

## 2024-03-17 ENCOUNTER — Ambulatory Visit (INDEPENDENT_AMBULATORY_CARE_PROVIDER_SITE_OTHER): Admitting: Nurse Practitioner

## 2024-03-17 VITALS — BP 136/84 | HR 63 | Ht 69.0 in | Wt 174.2 lb

## 2024-03-17 DIAGNOSIS — Z87891 Personal history of nicotine dependence: Secondary | ICD-10-CM | POA: Diagnosis not present

## 2024-03-17 DIAGNOSIS — R918 Other nonspecific abnormal finding of lung field: Secondary | ICD-10-CM

## 2024-03-17 DIAGNOSIS — J449 Chronic obstructive pulmonary disease, unspecified: Secondary | ICD-10-CM

## 2024-03-17 LAB — EKG 12-LEAD

## 2024-03-17 NOTE — Patient Instructions (Addendum)
 Continue Albuterol  inhaler 2 puffs or 3 mL neb every 6 hours as needed for shortness of breath or wheezing. Notify if symptoms persist despite rescue inhaler/neb use. Continue Breztri  2 puff Twice daily. Brush tongue and rinse mouth afterwards Stop azithromycin   Repeat CT chest in one year. You've aged out of the lung cancer screening program but we will continue to monitor given your smoking history   Make sure you continue to stay active    Follow up with new pulmonologist or Kevin Shepard in 3 months. If symptoms do not improve or worsen, please contact office for sooner follow up or seek emergency care.

## 2024-03-17 NOTE — Assessment & Plan Note (Addendum)
 Lung RADS 2 02/2024. He will age out of the screening program this upcoming December. Plan to continue annual surveillance CTs given smoking history with quit date <15 years ago, and current health status. Repeat CT chest 02/2025

## 2024-03-17 NOTE — Progress Notes (Signed)
 @Patient  ID: Kevin Shepard, male    DOB: 04-10-1946, 78 y.o.   MRN: [id number]  Chief Complaint  Patient presents with   Follow-up    CT results     Referring provider: Elvira Hammersmith, *  HPI: 78 year old male, former smoker followed for COPD with chronic bronchitis. He is a former patient of Dr. Glenis Langdon and last seen in office 07/23/2023. He is followed by the lung cancer screening program. Past medical history significant for HLD, insomnia, chronic constipation, BPH.  TEST/EVENTS:  08/20/2022 LDCT chest: atherosclerosis. Nonenlarged coarsely calcified subcarinal and right hilar nodes, unchanged and compatible with prior granulomatous disease. Mild centrilobular emphysema and diffuse bronchial wall thickening. 4.3 RML nodule has resolved. Lung RADS 2. 11/11/2022 sputum culture positive for moraxella cattarrhalis, AFB neg 11/25/2022 PFT: FVC 61, FEV1 57, ratio 74, TLC 80, DLCOcor 77. Positive BD (16%) 08/23/2023 LDCT chest: Lung RADS 2. Atherosclerosis. Emphysema 02/21/2024 LDCT chest: atherosclerosis. Emphysema. Stable small calcified mediastinal and hilar nodes, consistent with prior granulomatous disease. Lung RADS 2  12/11/2022: OV with Dr. Thelda Finney. He had re-established care back in January 2024. Chronic cough for the past 3 months.  Started on Trelegy 1 puff daily and as needed albuterol .  He then was seen again in February 2024.  Was awaiting culture for AFB.  Had positive sputum culture with Moraxella catarrhalis.  AFB ended up being negative.  He also had pulmonary function testing completed which showed FEV1 of 67% and positive bronchodilator response.  Presented for follow-up.  Still having daily cough and sputum production, despite doxycycline  course.  Seems to be more of a chronic bronchitis problem.  Advised starting azithromycin  on Monday Wednesday Friday.  Plan for repeat EKG in 6 weeks.  01/25/2023: OV with Spyridon Hornstein NP for follow up after being started on chronic macrolide therapy  with azithromycin  M/W/F. He tells me he is feeling much better. He has not noticed any more wheezing and his breathing feels pretty good. He's able to exercise again and walk long distances without trouble. His cough is minimal; usually only when he gets a dry throat. He's producing very little phlegm. No concerns or complaints today. Just curious what he should do with his neb treatments. He is using his Trelegy daily.   05/07/2023: OV with Kaivon Livesey NP for follow up. Doing well on chronic macrolide therapy. No hearing changes. Breathing feels like it is stable. Able to complete activities without limitations. He does have some occasional chest congestion but usually resolves on his own. Minimal cough; usually only when he feels he needs to clear his throat. Not having to use SABA. On Trelegy daily.   07/22/2024: OV with Dr. Thelda Finney. F/u. Doing well from respiratory standpoint. Using Trelegy; some scratchy throat. Takes azithromycin  M/W/F. Switched to Breztri .   03/17/2024: Today - follow up Discussed the use of AI scribe software for clinical note transcription with the patient, who gave verbal consent to proceed.  History of Present Illness   Kevin Shepard is a 78 year old male with COPD who presents for follow-up of lung nodules and respiratory symptoms.  He has a history of smoking with 40 pack year history and quit in 2018. He has been part of a lung cancer screening program and recently had a CT scan which showed stable lung nodules. he has aged out of the program.  He experiences shortness of breath, which is stable and not worse than before. He gets short winded periodically, during activities like stair climbing  or walking on inclines, but can walk on flat surfaces and complete ADLs without difficulty.   He uses the Breztri  inhaler. He sometimes skips using it due to experiencing a sore throat. He is also on azithromycin  three times a week (Mondays, Wednesdays, and Fridays) and has not had a  flare-up requiring antibiotics or steroids for a long time. No hospitalizations.   He rarely uses his rescue or nebulizer. No current cough.       No Known Allergies  Immunization History  Administered Date(s) Administered   Fluad Quad(high Dose 65+) 07/25/2020, 09/15/2022, 06/04/2023   Influenza, High Dose Seasonal PF 06/07/2018, 07/03/2019   Influenza-Unspecified 07/05/2017, 07/13/2021   PFIZER(Purple Top)SARS-COV-2 Vaccination 11/14/2019, 12/10/2019, 08/02/2020, 06/10/2023   Pfizer Covid-19 Vaccine Bivalent Booster 1yrs & up 08/24/2022   Pneumococcal Conjugate-13 06/27/2014   Pneumococcal Polysaccharide-23 02/05/2006, 05/15/2013   Rsv, Mab, Trudy Fusi, 0.5 Ml, Neonate To 24 Mos(Beyfortus) 08/24/2022   Tdap 08/16/2007   Zoster Recombinant(Shingrix) 08/24/2022, 06/14/2023    Past Medical History:  Diagnosis Date   Blood transfusion without reported diagnosis    Cataract    beginning   Colon polyps    COPD (chronic obstructive pulmonary disease) (HCC)    Hyperlipidemia     Tobacco History: Social History   Tobacco Use  Smoking Status Former   Current packs/day: 0.00   Average packs/day: 1 pack/day for 40.0 years (40.0 ttl pk-yrs)   Types: Cigarettes   Start date: 10/04/1977   Quit date: 10/04/2017   Years since quitting: 6.4   Passive exposure: Past  Smokeless Tobacco Never   Counseling given: Not Answered   Outpatient Medications Prior to Visit  Medication Sig Dispense Refill   acyclovir  (ZOVIRAX ) 200 MG capsule TAKE 1 CAPSULE BY MOUTH FOUR TIMES DAILY. AT TIME OF FLARE-UP 28 capsule 1   albuterol  (PROVENTIL ) (2.5 MG/3ML) 0.083% nebulizer solution Take 3 mLs (2.5 mg total) by nebulization every 6 (six) hours as needed for wheezing or shortness of breath. 75 mL 12   Budeson-Glycopyrrol-Formoterol  (BREZTRI  AEROSPHERE) 160-9-4.8 MCG/ACT AERO Inhale 2 puffs into the lungs in the morning and at bedtime. 10.7 g 2   Cholecalciferol (VITAMIN D3 PO) Take by mouth  daily. Take one pill daily     clobetasol  cream (TEMOVATE ) 0.05 % Apply 1 Application topically 2 (two) times daily. 30 g 0   doxycycline  (VIBRA -TABS) 100 MG tablet Take 1 tablet (100 mg total) by mouth 2 (two) times daily. 14 tablet 0   Eszopiclone  3 MG TABS TAKE 1 TABLET(3 MG) BY MOUTH AT BEDTIME AS NEEDED 30 tablet 1   METAMUCIL FIBER PO Take by mouth as needed.     Multiple Vitamin (MULTIVITAMIN ADULT PO) Take by mouth daily. Take tablet daily     predniSONE  (DELTASONE ) 10 MG tablet 4 tabs for 2 days, then 3 tabs for 2 days, 2 tabs for 2 days, then 1 tab for 2 days, then stop 20 tablet 0   PREVIDENT 5000 BOOSTER PLUS 1.1 % PSTE SMARTSIG:Sparingly To Teeth     rosuvastatin  (CRESTOR ) 10 MG tablet TAKE 1 TABLET(10 MG) BY MOUTH DAILY 90 tablet 3   SHINGRIX injection Inject 0.5 mLs into the muscle once.     tamsulosin  (FLOMAX ) 0.4 MG CAPS capsule TAKE 1 CAPSULE(0.4 MG) BY MOUTH DAILY 90 capsule 3   TRELEGY ELLIPTA  100-62.5-25 MCG/ACT AEPB INHALE 1 PUFF INTO THE LUNGS EVERY DAY (Patient taking differently: Take 1 puff by mouth daily as needed. Patient uses prn) 60 each 3   VENTOLIN   HFA 108 (90 Base) MCG/ACT inhaler INHALE 1 TO 2 PUFFS INTO THE LUNGS EVERY 6 HOURS AS NEEDED FOR WHEEZING OR SHORTNESS OF BREATH 18 g 1   No facility-administered medications prior to visit.     Review of Systems:   Constitutional: No weight loss or gain, night sweats, fevers, chills, fatigue, or lassitude. HEENT: No headaches, difficulty swallowing, tooth/dental problems, or sore throat. No sneezing, itching, ear ache, nasal congestion, or post nasal drip CV:  No chest pain, orthopnea, PND, swelling in lower extremities, anasarca, dizziness, palpitations, syncope Resp: +shortness of breath with exertion; minimal cough. No excess mucus or change in color of mucus. No hemoptysis. No wheezing.  No chest wall deformity GI:  No heartburn, indigestion, abdominal pain, nausea, vomiting, diarrhea, change in bowel habits,  loss of appetite, bloody stools.  GU: No dysuria, change in color of urine, urgency or frequency.   Skin: No rash, lesions, ulcerations MSK:  No joint pain or swelling.   Neuro: No dizziness or lightheadedness.  Psych: No depression or anxiety. Mood stable.     Physical Exam:  BP 136/84 (BP Location: Right Arm, Patient Position: Sitting, Cuff Size: Normal)   Pulse 63   Ht 5' 9 (1.753 m)   Wt 174 lb 3.2 oz (79 kg)   SpO2 95%   BMI 25.72 kg/m   GEN: Pleasant, interactive, well-appearing; in no acute distress HEENT:  Normocephalic and atraumatic. PERRLA. Sclera white. Nasal turbinates pink, moist and patent bilaterally. No rhinorrhea present. Oropharynx pink and moist, without exudate or edema. No lesions, ulcerations, or postnasal drip.  NECK:  Supple w/ fair ROM. Thyroid  symmetrical with no goiter or nodules palpated. No lymphadenopathy.   CV: RRR, no m/r/g, no peripheral edema. Pulses intact, +2 bilaterally. No cyanosis, pallor or clubbing. PULMONARY:  Unlabored, regular breathing. Clear bilaterally A&P w/o wheezes/rales/rhonchi. No accessory muscle use.  GI: BS present and normoactive. Soft, non-tender to palpation. No organomegaly or masses detected. MSK: No erythema, warmth or tenderness. Cap refil <2 sec all extrem. No deformities or joint swelling noted.  Neuro: A/Ox3. No focal deficits noted.   Skin: Warm, no lesions or rashe Psych: Normal affect and behavior. Judgement and thought content appropriate.     Lab Results:  CBC    Component Value Date/Time   WBC 5.6 09/20/2023 0827   RBC 4.39 09/20/2023 0827   HGB 14.0 09/20/2023 0827   HGB 13.6 07/18/2019 1113   HCT 41.1 09/20/2023 0827   HCT 40.8 07/18/2019 1113   PLT 243.0 09/20/2023 0827   PLT 295 07/18/2019 1113   MCV 93.6 09/20/2023 0827   MCV 92 07/18/2019 1113   MCH 30.9 10/30/2022 1534   MCHC 34.2 09/20/2023 0827   RDW 13.9 09/20/2023 0827   RDW 12.9 07/18/2019 1113   LYMPHSABS 2.0 09/20/2023 0827    LYMPHSABS 2.2 07/18/2019 1113   MONOABS 0.5 09/20/2023 0827   EOSABS 0.3 09/20/2023 0827   EOSABS 0.3 07/18/2019 1113   BASOSABS 0.0 09/20/2023 0827   BASOSABS 0.0 07/18/2019 1113    BMET    Component Value Date/Time   NA 139 09/20/2023 0827   NA 140 07/31/2020 1020   K 4.1 09/20/2023 0827   CL 102 09/20/2023 0827   CO2 30 09/20/2023 0827   GLUCOSE 94 09/20/2023 0827   BUN 13 09/20/2023 0827   BUN 12 07/31/2020 1020   CREATININE 1.11 09/20/2023 0827   CALCIUM  8.6 09/20/2023 0827   GFRNONAA 73 07/31/2020 1020   GFRAA 85 07/31/2020  1020    BNP No results found for: BNP   Imaging:  CT CHEST LCS NODULE F/U LOW DOSE WO CONTRAST Result Date: 03/13/2024 CLINICAL DATA:  Lung cancer screening. Thirty-six pack-year history, quit 7 years ago. EXAM: CT CHEST WITHOUT CONTRAST FOR LUNG CANCER SCREENING NODULE FOLLOW-UP TECHNIQUE: Multidetector CT imaging of the chest was performed following the standard protocol without IV contrast. RADIATION DOSE REDUCTION: This exam was performed according to the departmental dose-optimization program which includes automated exposure control, adjustment of the mA and/or kV according to patient size and/or use of iterative reconstruction technique. COMPARISON:  Lung cancer screening CT 08/23/2023 FINDINGS: Cardiovascular: The heart is normal in size. There are coronary artery calcifications. No pericardial effusion. Atherosclerosis of the thoracic aorta without aneurysm. Mediastinum/Nodes: Small calcified mediastinal and right hilar lymph nodes consistent with prior granulomatous disease. No noncalcified adenopathy. Unremarkable esophagus. No visible thyroid  nodule. Lungs/Pleura: Mild emphysema and diffuse bronchial thickening. No consolidative airspace disease. No pleural effusion. Pulmonary nodules on prior exam are stable, largest in the anterior left upper lobe measuring 9.5 mm mean diameter series 3, image 164. There is also a calcified granuloma in the  right middle lobe. Areas of subpleural scarring in the lingula and both lower lobes. Upper Abdomen: No acute upper abdominal findings. Musculoskeletal: There are no acute or suspicious osseous abnormalities. Mild scoliosis and thoracic spondylosis. IMPRESSION: 1. Lung-RADS 2, benign appearance or behavior. Continue annual screening with low-dose chest CT without contrast in 12 months. 2. Coronary artery calcifications. Aortic Atherosclerosis (ICD10-I70.0) and Emphysema (ICD10-J43.9). Electronically Signed   By: Chadwick Colonel M.D.   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 No recent exacerbations or hospitalizations. He has been on chronic macrolide therapy for quite some time now. Given risks of long term use and stability of symptoms, will discontinue and monitor response. EKG today with stable QTc and unchanged from prior. Encouraged to utilize Breztri  consistently. Aware of role of maintenance vs rescue. Action plan in place. Graded exercises. Trigger prevention. UTD on vaccines.  Patient Instructions  Continue Albuterol  inhaler 2 puffs or 3 mL neb every 6 hours as needed for shortness of breath or wheezing. Notify if symptoms persist despite rescue inhaler/neb use. Continue Breztri  2 puff Twice daily. Brush tongue and rinse mouth afterwards Stop azithromycin   Repeat CT chest in one year. You've  aged out of the lung cancer screening program but we will continue to monitor given your smoking history   Make sure you continue to stay active    Follow up with new pulmonologist or Katie Liannah Yarbough in 3 months. If  symptoms do not improve or worsen, please contact office for sooner follow up or seek emergency care.    Lung nodules Lung RADS 2 02/2024. He will age out of the screening program this upcoming December. Plan to continue annual surveillance CTs given smoking history with quit date <15 years ago, and current health status. Repeat CT chest 02/2025  Former smoker 40 pack year hx. Quit 2018. See above    I spent 35 minutes of dedicated to the care of this patient on the date of this encounter to include pre-visit review of records, face-to-face time with the patient discussing conditions above, post visit ordering of testing, clinical documentation with the electronic health record, making appropriate referrals as documented, and communicating necessary findings to members of the patients care team.  Roetta Clarke, NP 03/17/2024  Pt aware and understands NP's role.

## 2024-03-17 NOTE — Assessment & Plan Note (Signed)
 Moderate COPD. No recent exacerbations or hospitalizations. He has been on chronic macrolide therapy for quite some time now. Given risks of long term use and stability of symptoms, will discontinue and monitor response. EKG today with stable QTc and unchanged from prior. Encouraged to utilize Breztri  consistently. Aware of role of maintenance vs rescue. Action plan in place. Graded exercises. Trigger prevention. UTD on vaccines.  Patient Instructions  Continue Albuterol  inhaler 2 puffs or 3 mL neb every 6 hours as needed for shortness of breath or wheezing. Notify if symptoms persist despite rescue inhaler/neb use. Continue Breztri  2 puff Twice daily. Brush tongue and rinse mouth afterwards Stop azithromycin   Repeat CT chest in one year. You've aged out of the lung cancer screening program but we will continue to monitor given your smoking history   Make sure you continue to stay active    Follow up with new pulmonologist or Katie Jill Ruppe in 3 months. If symptoms do not improve or worsen, please contact office for sooner follow up or seek emergency care.

## 2024-03-17 NOTE — Assessment & Plan Note (Signed)
 40 pack year hx. Quit 2018. See above

## 2024-03-21 ENCOUNTER — Encounter: Payer: Self-pay | Admitting: Emergency Medicine

## 2024-03-21 ENCOUNTER — Ambulatory Visit (INDEPENDENT_AMBULATORY_CARE_PROVIDER_SITE_OTHER): Payer: Medicare Other | Admitting: Emergency Medicine

## 2024-03-21 ENCOUNTER — Ambulatory Visit: Payer: Self-pay | Admitting: Emergency Medicine

## 2024-03-21 VITALS — BP 122/64 | HR 64 | Temp 98.0°F | Ht 69.0 in | Wt 176.0 lb

## 2024-03-21 DIAGNOSIS — I2510 Atherosclerotic heart disease of native coronary artery without angina pectoris: Secondary | ICD-10-CM | POA: Diagnosis not present

## 2024-03-21 DIAGNOSIS — J449 Chronic obstructive pulmonary disease, unspecified: Secondary | ICD-10-CM | POA: Diagnosis not present

## 2024-03-21 DIAGNOSIS — G4700 Insomnia, unspecified: Secondary | ICD-10-CM | POA: Diagnosis not present

## 2024-03-21 DIAGNOSIS — I700 Atherosclerosis of aorta: Secondary | ICD-10-CM

## 2024-03-21 DIAGNOSIS — R918 Other nonspecific abnormal finding of lung field: Secondary | ICD-10-CM | POA: Diagnosis not present

## 2024-03-21 DIAGNOSIS — E785 Hyperlipidemia, unspecified: Secondary | ICD-10-CM

## 2024-03-21 DIAGNOSIS — R399 Unspecified symptoms and signs involving the genitourinary system: Secondary | ICD-10-CM | POA: Diagnosis not present

## 2024-03-21 LAB — COMPREHENSIVE METABOLIC PANEL WITH GFR
ALT: 20 U/L (ref 0–53)
AST: 19 U/L (ref 0–37)
Albumin: 4.4 g/dL (ref 3.5–5.2)
Alkaline Phosphatase: 39 U/L (ref 39–117)
BUN: 14 mg/dL (ref 6–23)
CO2: 28 meq/L (ref 19–32)
Calcium: 9.1 mg/dL (ref 8.4–10.5)
Chloride: 104 meq/L (ref 96–112)
Creatinine, Ser: 0.95 mg/dL (ref 0.40–1.50)
GFR: 77.2 mL/min (ref >60.00)
Glucose, Bld: 98 mg/dL (ref 70–99)
Potassium: 4.3 meq/L (ref 3.5–5.1)
Sodium: 138 meq/L (ref 135–145)
Total Bilirubin: 0.7 mg/dL (ref 0.2–1.2)
Total Protein: 7 g/dL (ref 6.0–8.3)

## 2024-03-21 LAB — LIPID PANEL
Cholesterol: 170 mg/dL (ref 0–200)
HDL: 53.5 mg/dL (ref >39.00)
LDL Cholesterol: 102 mg/dL — ABNORMAL HIGH (ref 0–99)
NonHDL: 116.99
Total CHOL/HDL Ratio: 3
Triglycerides: 74 mg/dL (ref 0.0–149.0)
VLDL: 14.8 mg/dL (ref 0.0–40.0)

## 2024-03-21 MED ORDER — TRAZODONE HCL 50 MG PO TABS: 25.0000 mg | ORAL_TABLET | Freq: Every evening | ORAL | 3 refills | Status: DC | PRN

## 2024-03-21 NOTE — Assessment & Plan Note (Signed)
No recent anginal episodes Stable chronic condition

## 2024-03-21 NOTE — Assessment & Plan Note (Signed)
 Moderate COPD. No recent exacerbations or hospitalizations. He has been on chronic macrolide therapy for quite some time now. Given risks of long term use and stability of symptoms, will discontinue and monitor response. EKG today with stable QTc and unchanged from prior. Encouraged to utilize Breztri  consistently. Aware of role of maintenance vs rescue. Action plan in place. Graded exercises. Trigger prevention. UTD on vaccines.   Patient Instructions  Continue Albuterol  inhaler 2 puffs or 3 mL neb every 6 hours as needed for shortness of breath or wheezing. Notify if symptoms persist despite rescue inhaler/neb use. Continue Breztri  2 puff Twice daily. Brush tongue and rinse mouth afterwards Stop azithromycin    Repeat CT chest in one year. You've aged out of the lung cancer screening program but we will continue to monitor given your smoking history    Make sure you continue to stay active

## 2024-03-21 NOTE — Assessment & Plan Note (Signed)
 Lung RADS 2 02/2024. He will age out of the screening program this upcoming December. Plan to continue annual surveillance CTs given smoking history with quit date <15 years ago, and current health status. Repeat CT chest 02/2025

## 2024-03-21 NOTE — Assessment & Plan Note (Signed)
 Still chronic and affecting quality of life Has been taking Lunesta . Wants to try trazodone New prescription for trazodone 50 mg tablets sent to pharmacy of record today

## 2024-03-21 NOTE — Assessment & Plan Note (Signed)
Diet and nutrition discussed.  Continue rosuvastatin 10 mg daily. Lipid profile done today.

## 2024-03-21 NOTE — Progress Notes (Signed)
 Kevin Shepard 78 y.o.   Chief Complaint  Patient presents with   Follow-up    Patient here for 6 month f/ju. Patient has questions about APOB test, sleeping issues even with meds, back and right knee concerns pain that comes and goes.     HISTORY OF PRESENT ILLNESS: This is a 78 y.o. male here for 73-month follow-up of chronic medical conditions Wants to try trazodone for sleep Has occasional right knee pain No other complaints or medical concerns today.  HPI   Prior to Admission medications   Medication Sig Start Date End Date Taking? Authorizing Provider  acyclovir  (ZOVIRAX ) 200 MG capsule TAKE 1 CAPSULE BY MOUTH FOUR TIMES DAILY. AT TIME OF FLARE-UP 11/09/23  Yes Elvira Hammersmith, MD  albuterol  (PROVENTIL ) (2.5 MG/3ML) 0.083% nebulizer solution Take 3 mLs (2.5 mg total) by nebulization every 6 (six) hours as needed for wheezing or shortness of breath. 01/25/23  Yes Cobb, Mariah Shines, NP  Budeson-Glycopyrrol-Formoterol  (BREZTRI  AEROSPHERE) 160-9-4.8 MCG/ACT AERO Inhale 2 puffs into the lungs in the morning and at bedtime. 10/21/23  Yes Cobb, Mariah Shines, NP  Cholecalciferol (VITAMIN D3 PO) Take by mouth daily. Take one pill daily   Yes [provider]  clobetasol  cream (TEMOVATE ) 0.05 % Apply 1 Application topically 2 (two) times daily. 03/31/23  Yes Elvira Hammersmith, MD  Eszopiclone  3 MG TABS TAKE 1 TABLET(3 MG) BY MOUTH AT BEDTIME AS NEEDED 02/29/24  Yes Lillianne Eick, Isidro Margo, MD  METAMUCIL FIBER PO Take by mouth as needed.   Yes [provider]  Multiple Vitamin (MULTIVITAMIN ADULT PO) Take by mouth daily. Take tablet daily   Yes [provider]  PREVIDENT 5000 BOOSTER PLUS 1.1 % PSTE SMARTSIG:Sparingly To Teeth 05/14/21  Yes [provider]  rosuvastatin  (CRESTOR ) 10 MG tablet TAKE 1 TABLET(10 MG) BY MOUTH DAILY 02/22/23  Yes Pascual Mantel, Isidro Margo, MD  tamsulosin  (FLOMAX ) 0.4 MG CAPS capsule TAKE 1 CAPSULE(0.4 MG) BY MOUTH DAILY 12/03/23  Yes  Halen Mossbarger Jose, MD  TRELEGY ELLIPTA  100-62.5-25 MCG/ACT AEPB INHALE 1 PUFF INTO THE LUNGS EVERY DAY Patient taking differently: Take 1 puff by mouth daily as needed. Patient uses prn 02/23/23  Yes Cobb, Mariah Shines, NP  VENTOLIN  HFA 108 (90 Base) MCG/ACT inhaler INHALE 1 TO 2 PUFFS INTO THE LUNGS EVERY 6 HOURS AS NEEDED FOR WHEEZING OR SHORTNESS OF BREATH 05/04/23  Yes Icard, Lucie Ruts, DO  SHINGRIX injection Inject 0.5 mLs into the muscle once. Patient not taking: Reported on 03/21/2024 06/14/23   [provider]    No Known Allergies  Patient Active Problem List   Diagnosis Date Noted   Chronic constipation 02/10/2022   Lower urinary tract symptoms 02/10/2022   Dyslipidemia 01/29/2021   Atherosclerosis of aorta (HCC) 01/29/2021   Insomnia 01/29/2021   Atherosclerosis of native coronary artery of native heart without angina pectoris 01/29/2021   Seborrheic keratoses 04/11/2020   Actinic keratoses 04/11/2020   COPD  GOLD II/ Group A 06/02/2018   Lung nodules 06/02/2018   Former smoker 04/06/2018   Alcohol use 01/10/2018   Chronic bilateral low back pain without sciatica 01/10/2018   History of tobacco use 01/10/2018   History of back surgery 01/10/2018    Past Medical History:  Diagnosis Date   Blood transfusion without reported diagnosis    Cataract    beginning   Colon polyps    COPD (chronic obstructive pulmonary disease) (HCC)    Hyperlipidemia     Past Surgical History:  Procedure Laterality Date   APPENDECTOMY     per patient 1950's   COLONOSCOPY     Last 02/19/2017 [facility] -for an 8 mm polyps rectum and hepatic flexure with history of polyps prior   FRACTURE SURGERY  1974   right clavicle   LUMBAR SPINE SURGERY  2018   SPINE SURGERY  10/2016   TONSILLECTOMY      Social History   Socioeconomic History   Marital status: Married    Spouse name: Not on file   Number of children: Not on file   Years of education: Not on file    Highest education level: Not on file  Occupational History   Occupation: stock market trading  Tobacco Use   Smoking status: Former    Current packs/day: 0.00    Average packs/day: 1 pack/day for 40.0 years (40.0 ttl pk-yrs)    Types: Cigarettes    Start date: 10/04/1977    Quit date: 10/04/2017    Years since quitting: 6.4    Passive exposure: Past   Smokeless tobacco: Never  Vaping Use   Vaping status: Never Used  Substance and Sexual Activity   Alcohol use: Yes    Comment: 2-3 days/week   Drug use: Never   Sexual activity: Yes    Partners: Female  Other Topics Concern   Not on file  Social History Narrative   Not on file   Social Drivers of Health   Financial Resource Strain: Low Risk  (06/23/2023)   Overall Financial Resource Strain (CARDIA)    Difficulty of Paying Living Expenses: Not hard at all  Food Insecurity: No Food Insecurity (06/23/2023)   Hunger Vital Sign    Worried About Running Out of Food in the Last Year: Never true    Ran Out of Food in the Last Year: Never true  Transportation Needs: No Transportation Needs (06/23/2023)   PRAPARE - Administrator, Civil Service (Medical): No    Lack of Transportation (Non-Medical): No  Physical Activity: Sufficiently Active (06/23/2023)   Exercise Vital Sign    Days of Exercise per Week: 7 days    Minutes of Exercise per Session: 50 min  Stress: No Stress Concern Present (06/23/2023)   [employer] of Occupational Health - Occupational Stress Questionnaire    Feeling of Stress : Not at all  Social Connections: Moderately Isolated (06/23/2023)   Social Connection and Isolation Panel    Frequency of Communication with Friends and Family: Three times a week    Frequency of Social Gatherings with Friends and Family: Three times a week    Attends Religious Services: Never    Active Member of Clubs or Organizations: No    Attends Banker Meetings: Never    Marital Status: Married  Careers information officer Violence: Not At Risk (06/23/2023)   Humiliation, Afraid, Rape, and Kick questionnaire    Fear of Current or Ex-Partner: No    Emotionally Abused: No    Physically Abused: No    Sexually Abused: No    Family History  Problem Relation Age of Onset   Cancer Mother        Breast   Early death Father    Heart disease Father 19       rheumatic heart disease   Hyperlipidemia Sister    Colon cancer Neg Hx    Colon polyps Neg Hx    Crohn's disease Neg Hx    Esophageal cancer Neg Hx  Rectal cancer Neg Hx    Stomach cancer Neg Hx    Ulcerative colitis Neg Hx      Review of Systems  Constitutional: Negative.  Negative for chills and fever.  HENT: Negative.  Negative for congestion and sore throat.   Respiratory: Negative.  Negative for cough and shortness of breath.   Cardiovascular: Negative.  Negative for chest pain and palpitations.  Gastrointestinal:  Negative for abdominal pain, diarrhea, nausea and vomiting.  Genitourinary: Negative.  Negative for dysuria.       Nocturia  Musculoskeletal:  Positive for back pain and joint pain.  Skin: Negative.  Negative for rash.  Neurological: Negative.  Negative for dizziness and headaches.  Psychiatric/Behavioral:  The patient has insomnia.   All other systems reviewed and are negative.   Vitals:   03/21/24 0806  BP: 122/64  Pulse: 64  Temp: 98 F (36.7 C)  SpO2: 95%    Physical Exam Vitals reviewed.  Constitutional:      Appearance: Normal appearance.  HENT:     Head: Normocephalic.     Right Ear: Tympanic membrane, ear canal and external ear normal.     Left Ear: Tympanic membrane, ear canal and external ear normal.     Mouth/Throat:     Mouth: Mucous membranes are moist.     Pharynx: Oropharynx is clear.   Eyes:     Extraocular Movements: Extraocular movements intact.     Pupils: Pupils are equal, round, and reactive to light.    Cardiovascular:     Rate and Rhythm: Normal rate and regular rhythm.      Pulses: Normal pulses.     Heart sounds: Normal heart sounds.  Pulmonary:     Effort: Pulmonary effort is normal.     Breath sounds: Normal breath sounds.  Abdominal:     Palpations: Abdomen is soft.     Tenderness: There is no abdominal tenderness.   Musculoskeletal:     Cervical back: No tenderness.  Lymphadenopathy:     Cervical: No cervical adenopathy.   Skin:    General: Skin is warm and dry.     Capillary Refill: Capillary refill takes less than 2 seconds.   Neurological:     General: No focal deficit present.     Mental Status: He is alert and oriented to person, place, and time.   Psychiatric:        Mood and Affect: Mood normal.        Behavior: Behavior normal.      ASSESSMENT & PLAN: A total of 45 minutes was spent with the patient and counseling/coordination of care regarding preparing for this visit, review of most recent office visit notes, review of multiple chronic medical conditions and their management, review of all medications, review of most recent bloodwork results, review of health maintenance items, education on nutrition, prognosis, documentation, and need for follow up.   Problem List Items Addressed This Visit       Cardiovascular and Mediastinum   Atherosclerosis of aorta (HCC)   Diet and nutrition discussed Continue rosuvastatin  10 mg daily Lipid profile done today      Relevant Orders   Comprehensive metabolic panel with GFR   Lipid panel   Atherosclerosis of native coronary artery of native heart without angina pectoris   No recent anginal episodes Stable chronic condition        Respiratory   COPD  GOLD II/ Group A   Moderate COPD. No recent exacerbations or hospitalizations.  He has been on chronic macrolide therapy for quite some time now. Given risks of long term use and stability of symptoms, will discontinue and monitor response. EKG today with stable QTc and unchanged from prior. Encouraged to utilize Breztri  consistently.  Aware of role of maintenance vs rescue. Action plan in place. Graded exercises. Trigger prevention. UTD on vaccines.   Patient Instructions  Continue Albuterol  inhaler 2 puffs or 3 mL neb every 6 hours as needed for shortness of breath or wheezing. Notify if symptoms persist despite rescue inhaler/neb use. Continue Breztri  2 puff Twice daily. Brush tongue and rinse mouth afterwards Stop azithromycin    Repeat CT chest in one year. You've aged out of the lung cancer screening program but we will continue to monitor given your smoking history    Make sure you continue to stay active       Lung nodules   Lung RADS 2 02/2024. He will age out of the screening program this upcoming December. Plan to continue annual surveillance CTs given smoking history with quit date <15 years ago, and current health status. Repeat CT chest 02/2025         Other   Dyslipidemia - Primary   Diet and nutrition discussed Continue rosuvastatin  10 mg daily Lipid profile done today      Relevant Orders   Comprehensive metabolic panel with GFR   Lipid panel   Insomnia   Still chronic and affecting quality of life Has been taking Lunesta . Wants to try trazodone New prescription for trazodone 50 mg tablets sent to pharmacy of record today      Relevant Medications   traZODone (DESYREL) 50 MG tablet   Lower urinary tract symptoms   Well-controlled with tamsulosin  0.4 mg at bedtime      Patient Instructions  Health Maintenance After Age 25 After age 56, you are at a higher risk for certain long-term diseases and infections as well as injuries from falls. Falls are a major cause of broken bones and head injuries in people who are older than age 19. Getting regular preventive care can help to keep you healthy and well. Preventive care includes getting regular testing and making lifestyle changes as recommended by your health care provider. Talk with your health care provider about: Which screenings and tests  you should have. A screening is a test that checks for a disease when you have no symptoms. A diet and exercise plan that is right for you. What should I know about screenings and tests to prevent falls? Screening and testing are the best ways to find a health problem early. Early diagnosis and treatment give you the best chance of managing medical conditions that are common after age 62. Certain conditions and lifestyle choices may make you more likely to have a fall. Your health care provider may recommend: Regular vision checks. Poor vision and conditions such as cataracts can make you more likely to have a fall. If you wear glasses, make sure to get your prescription updated if your vision changes. Medicine review. Work with your health care provider to regularly review all of the medicines you are taking, including over-the-counter medicines. Ask your health care provider about any side effects that may make you more likely to have a fall. Tell your health care provider if any medicines that you take make you feel dizzy or sleepy. Strength and balance checks. Your health care provider may recommend certain tests to check your strength and balance while standing, walking, or changing  positions. Foot health exam. Foot pain and numbness, as well as not wearing proper footwear, can make you more likely to have a fall. Screenings, including: Osteoporosis screening. Osteoporosis is a condition that causes the bones to get weaker and break more easily. Blood pressure screening. Blood pressure changes and medicines to control blood pressure can make you feel dizzy. Depression screening. You may be more likely to have a fall if you have a fear of falling, feel depressed, or feel unable to do activities that you used to do. Alcohol use screening. Using too much alcohol can affect your balance and may make you more likely to have a fall. Follow these instructions at home: Lifestyle Do not drink alcohol  if: Your health care provider tells you not to drink. If you drink alcohol: Limit how much you have to: 0-1 drink a day for women. 0-2 drinks a day for men. Know how much alcohol is in your drink. In the [place] one drink equals one 12 oz bottle of beer (355 mL), one 5 oz glass of wine (148 mL), or one 1 oz glass of hard liquor (44 mL). Do not use any products that contain nicotine or tobacco. These products include cigarettes, chewing tobacco, and vaping devices, such as e-cigarettes. If you need help quitting, ask your health care provider. Activity  Follow a regular exercise program to stay fit. This will help you maintain your balance. Ask your health care provider what types of exercise are appropriate for you. If you need a cane or walker, use it as recommended by your health care provider. Wear supportive shoes that have nonskid soles. Safety  Remove any tripping hazards, such as rugs, cords, and clutter. Install safety equipment such as grab bars in bathrooms and safety rails on stairs. Keep rooms and walkways well-lit. General instructions Talk with your health care provider about your risks for falling. Tell your health care provider if: You fall. Be sure to tell your health care provider about all falls, even ones that seem minor. You feel dizzy, tiredness (fatigue), or off-balance. Take over-the-counter and prescription medicines only as told by your health care provider. These include supplements. Eat a healthy diet and maintain a healthy weight. A healthy diet includes low-fat dairy products, low-fat (lean) meats, and fiber from whole grains, beans, and lots of fruits and vegetables. Stay current with your vaccines. Schedule regular health, dental, and eye exams. Summary Having a healthy lifestyle and getting preventive care can help to protect your health and wellness after age 78. Screening and testing are the best way to find a health problem early and help you avoid  having a fall. Early diagnosis and treatment give you the best chance for managing medical conditions that are more common for people who are older than age 39. Falls are a major cause of broken bones and head injuries in people who are older than age 62. Take precautions to prevent a fall at home. Work with your health care provider to learn what changes you can make to improve your health and wellness and to prevent falls. This information is not intended to replace advice given to you by your health care provider. Make sure you discuss any questions you have with your health care provider. Document Revised: 02/10/2021 Document Reviewed: 02/10/2021 Elsevier Patient Education  2024 Elsevier Inc.     Maryagnes Small, MD [facility]

## 2024-03-21 NOTE — Assessment & Plan Note (Signed)
Well-controlled with tamsulosin 0.4 mg at bedtime

## 2024-03-21 NOTE — Patient Instructions (Signed)
 Health Maintenance After Age 79 After age 4, you are at a higher risk for certain long-term diseases and infections as well as injuries from falls. Falls are a major cause of broken bones and head injuries in people who are older than age 47. Getting regular preventive care can help to keep you healthy and well. Preventive care includes getting regular testing and making lifestyle changes as recommended by your health care provider. Talk with your health care provider about: Which screenings and tests you should have. A screening is a test that checks for a disease when you have no symptoms. A diet and exercise plan that is right for you. What should I know about screenings and tests to prevent falls? Screening and testing are the best ways to find a health problem early. Early diagnosis and treatment give you the best chance of managing medical conditions that are common after age 37. Certain conditions and lifestyle choices may make you more likely to have a fall. Your health care provider may recommend: Regular vision checks. Poor vision and conditions such as cataracts can make you more likely to have a fall. If you wear glasses, make sure to get your prescription updated if your vision changes. Medicine review. Work with your health care provider to regularly review all of the medicines you are taking, including over-the-counter medicines. Ask your health care provider about any side effects that may make you more likely to have a fall. Tell your health care provider if any medicines that you take make you feel dizzy or sleepy. Strength and balance checks. Your health care provider may recommend certain tests to check your strength and balance while standing, walking, or changing positions. Foot health exam. Foot pain and numbness, as well as not wearing proper footwear, can make you more likely to have a fall. Screenings, including: Osteoporosis screening. Osteoporosis is a condition that causes  the bones to get weaker and break more easily. Blood pressure screening. Blood pressure changes and medicines to control blood pressure can make you feel dizzy. Depression screening. You may be more likely to have a fall if you have a fear of falling, feel depressed, or feel unable to do activities that you used to do. Alcohol use screening. Using too much alcohol can affect your balance and may make you more likely to have a fall. Follow these instructions at home: Lifestyle Do not drink alcohol if: Your health care provider tells you not to drink. If you drink alcohol: Limit how much you have to: 0-1 drink a day for women. 0-2 drinks a day for men. Know how much alcohol is in your drink. In the [place] one drink equals one 12 oz bottle of beer (355 mL), one 5 oz glass of wine (148 mL), or one 1 oz glass of hard liquor (44 mL). Do not use any products that contain nicotine or tobacco. These products include cigarettes, chewing tobacco, and vaping devices, such as e-cigarettes. If you need help quitting, ask your health care provider. Activity  Follow a regular exercise program to stay fit. This will help you maintain your balance. Ask your health care provider what types of exercise are appropriate for you. If you need a cane or walker, use it as recommended by your health care provider. Wear supportive shoes that have nonskid soles. Safety  Remove any tripping hazards, such as rugs, cords, and clutter. Install safety equipment such as grab bars in bathrooms and safety rails on stairs. Keep rooms and walkways  well-lit. General instructions Talk with your health care provider about your risks for falling. Tell your health care provider if: You fall. Be sure to tell your health care provider about all falls, even ones that seem minor. You feel dizzy, tiredness (fatigue), or off-balance. Take over-the-counter and prescription medicines only as told by your health care provider. These include  supplements. Eat a healthy diet and maintain a healthy weight. A healthy diet includes low-fat dairy products, low-fat (lean) meats, and fiber from whole grains, beans, and lots of fruits and vegetables. Stay current with your vaccines. Schedule regular health, dental, and eye exams. Summary Having a healthy lifestyle and getting preventive care can help to protect your health and wellness after age 11. Screening and testing are the best way to find a health problem early and help you avoid having a fall. Early diagnosis and treatment give you the best chance for managing medical conditions that are more common for people who are older than age 28. Falls are a major cause of broken bones and head injuries in people who are older than age 48. Take precautions to prevent a fall at home. Work with your health care provider to learn what changes you can make to improve your health and wellness and to prevent falls. This information is not intended to replace advice given to you by your health care provider. Make sure you discuss any questions you have with your health care provider. Document Revised: 02/10/2021 Document Reviewed: 02/10/2021 Elsevier Patient Education  2024 [employer].

## 2024-03-29 ENCOUNTER — Other Ambulatory Visit: Payer: Self-pay | Admitting: Emergency Medicine

## 2024-03-29 DIAGNOSIS — I700 Atherosclerosis of aorta: Secondary | ICD-10-CM

## 2024-03-29 DIAGNOSIS — E785 Hyperlipidemia, unspecified: Secondary | ICD-10-CM

## 2024-05-19 ENCOUNTER — Other Ambulatory Visit: Payer: Self-pay | Admitting: Emergency Medicine

## 2024-06-26 ENCOUNTER — Ambulatory Visit (INDEPENDENT_AMBULATORY_CARE_PROVIDER_SITE_OTHER): Payer: Medicare Other

## 2024-06-26 VITALS — Ht 69.0 in | Wt 176.0 lb

## 2024-06-26 DIAGNOSIS — Z1159 Encounter for screening for other viral diseases: Secondary | ICD-10-CM

## 2024-06-26 DIAGNOSIS — Z0000 Encounter for general adult medical examination without abnormal findings: Secondary | ICD-10-CM | POA: Diagnosis not present

## 2024-06-26 NOTE — Patient Instructions (Addendum)
 Kevin Shepard,  Thank you for taking the time for your Medicare Wellness Visit. I appreciate your continued commitment to your health goals. Please review the care plan we discussed, and feel free to reach out if I can assist you further.  Medicare recommends these wellness visits once per year to help you and your care team stay ahead of potential health issues. These visits are designed to focus on prevention, allowing your provider to concentrate on managing your acute and chronic conditions during your regular appointments.  Please note that Annual Wellness Visits do not include a physical exam. Some assessments may be limited, especially if the visit was conducted virtually. If needed, we may recommend a separate in-person follow-up with your provider.  Ongoing Care Seeing your primary care provider every 3 to 6 months helps us  monitor your health and provide consistent, personalized care.   Referrals If a referral was made during today's visit and you haven't received any updates within two weeks, please contact the referred provider directly to check on the status.  Recommended Screenings:  Health Maintenance  Topic Date Due   Hepatitis C Screening  Never done   Flu Shot  05/05/2024   COVID-19 Vaccine (6 - 2025-26 season) 06/05/2024   Screening for Lung Cancer  02/20/2025   Medicare Annual Wellness Visit  06/26/2025   Colon Cancer Screening  10/14/2027   Pneumococcal Vaccine for age over 75  Completed   Zoster (Shingles) Vaccine  Completed   HPV Vaccine  Aged Out   Meningitis B Vaccine  Aged Out   DTaP/Tdap/Td vaccine  Discontinued       06/26/2024    2:44 PM  Advanced Directives  Does Patient Have a Medical Advance Directive? Yes  Type of Estate agent of [place];Living will  Copy of Healthcare Power of Attorney in Chart? No - copy requested   Advance Care Planning is important because it: Ensures you receive medical care that aligns with your values,  goals, and preferences. Provides guidance to your family and loved ones, reducing the emotional burden of decision-making during critical moments.  Vision: Annual vision screenings are recommended for early detection of glaucoma, cataracts, and diabetic retinopathy. These exams can also reveal signs of chronic conditions such as diabetes and high blood pressure.  Dental: Annual dental screenings help detect early signs of oral cancer, gum disease, and other conditions linked to overall health, including heart disease and diabetes.

## 2024-06-26 NOTE — Progress Notes (Signed)
 Subjective:   Kevin Shepard is a 78 y.o. who presents for a Medicare Wellness preventive visit.  As a reminder, Annual Wellness Visits don't include a physical exam, and some assessments may be limited, especially if this visit is performed virtually. We may recommend an in-person follow-up visit with your provider if needed.  Visit Complete: Virtual I connected with  Kevin Shepard on 06/26/24 by a audio enabled telemedicine application and verified that I am speaking with the correct person using two identifiers.  Patient Location: Home  Provider Location: Office/Clinic  I discussed the limitations of evaluation and management by telemedicine. The patient expressed understanding and agreed to proceed.  Vital Signs: Because this visit was a virtual/telehealth visit, some criteria may be missing or patient reported. Any vitals not documented were not able to be obtained and vitals that have been documented are patient reported.  VideoDeclined- This patient declined Librarian, academic. Therefore the visit was completed with audio only.  Persons Participating in Visit: Patient.  AWV Questionnaire: No: Patient Medicare AWV questionnaire was not completed prior to this visit.  Cardiac Risk Factors include: advanced age (>40men, >63 women);dyslipidemia;male gender     Objective:    Today's Vitals   06/26/24 1433  Weight: 176 lb (79.8 kg)  Height: 5' 9 (1.753 m)   Body mass index is 25.99 kg/m.     06/26/2024    2:44 PM 06/23/2023    2:50 PM 05/15/2022   11:08 AM 02/01/2020   10:10 AM  Advanced Directives  Does Patient Have a Medical Advance Directive? Yes Yes Yes Yes  Type of Estate agent of [place];Living will Healthcare Power of [place];Living will Healthcare Power of [place];Living will Healthcare Power of [place];Living will  Does patient want to make changes to medical advance directive?    No - Patient declined  Copy of  Healthcare Power of Attorney in Chart? No - copy requested No - copy requested No - copy requested No - copy requested    Current Medications (verified) Outpatient Encounter Medications as of 06/26/2024  Medication Sig   acyclovir  (ZOVIRAX ) 200 MG capsule TAKE 1 CAPSULE BY MOUTH FOUR TIMES DAILY. AT TIME OF FLARE-UP   albuterol  (PROVENTIL ) (2.5 MG/3ML) 0.083% nebulizer solution Take 3 mLs (2.5 mg total) by nebulization every 6 (six) hours as needed for wheezing or shortness of breath.   Budeson-Glycopyrrol-Formoterol  (BREZTRI  AEROSPHERE) 160-9-4.8 MCG/ACT AERO Inhale 2 puffs into the lungs in the morning and at bedtime.   Cholecalciferol (VITAMIN D3 PO) Take by mouth daily. Take one pill daily   clobetasol  cream (TEMOVATE ) 0.05 % Apply 1 Application topically 2 (two) times daily.   Eszopiclone  3 MG TABS TAKE 1 TABLET(3 MG) BY MOUTH AT BEDTIME AS NEEDED   METAMUCIL FIBER PO Take by mouth as needed.   Multiple Vitamin (MULTIVITAMIN ADULT PO) Take by mouth daily. Take tablet daily   PREVIDENT 5000 BOOSTER PLUS 1.1 % PSTE SMARTSIG:Sparingly To Teeth   rosuvastatin  (CRESTOR ) 10 MG tablet TAKE 1 TABLET(10 MG) BY MOUTH DAILY   tamsulosin  (FLOMAX ) 0.4 MG CAPS capsule TAKE 1 CAPSULE(0.4 MG) BY MOUTH DAILY   traZODone  (DESYREL ) 50 MG tablet Take 0.5-1 tablets (25-50 mg total) by mouth at bedtime as needed for sleep.   VENTOLIN  HFA 108 (90 Base) MCG/ACT inhaler INHALE 1 TO 2 PUFFS INTO THE LUNGS EVERY 6 HOURS AS NEEDED FOR WHEEZING OR SHORTNESS OF BREATH   SHINGRIX injection Inject 0.5 mLs into the muscle once. (Patient not taking:  Reported on 06/26/2024)   No facility-administered encounter medications on file as of 06/26/2024.    Allergies (verified) Patient has no known allergies.   History: Past Medical History:  Diagnosis Date   Blood transfusion without reported diagnosis    Cataract    beginning   Colon polyps    COPD (chronic obstructive pulmonary disease) (HCC)    Hyperlipidemia     Past Surgical History:  Procedure Laterality Date   APPENDECTOMY     per patient 1950's   COLONOSCOPY     Last 02/19/2017 [facility] -for an 8 mm polyps rectum and hepatic flexure with history of polyps prior   FRACTURE SURGERY  1974   right clavicle   LUMBAR SPINE SURGERY  2018   SPINE SURGERY  10/2016   TONSILLECTOMY     Family History  Problem Relation Age of Onset   Cancer Mother        Breast   Early death Father    Heart disease Father 80       rheumatic heart disease   Hyperlipidemia Sister    Colon cancer Neg Hx    Colon polyps Neg Hx    Crohn's disease Neg Hx    Esophageal cancer Neg Hx    Rectal cancer Neg Hx    Stomach cancer Neg Hx    Ulcerative colitis Neg Hx    Social History   Socioeconomic History   Marital status: Married    Spouse name: Not on file   Number of children: Not on file   Years of education: Not on file   Highest education level: Not on file  Occupational History   Occupation: stock market trading  Tobacco Use   Smoking status: Former    Current packs/day: 0.00    Average packs/day: 1 pack/day for 40.0 years (40.0 ttl pk-yrs)    Types: Cigarettes    Start date: 10/04/1977    Quit date: 10/04/2017    Years since quitting: 6.7    Passive exposure: Past   Smokeless tobacco: Never  Vaping Use   Vaping status: Never Used  Substance and Sexual Activity   Alcohol use: Yes    Alcohol/week: 1.0 standard drink of alcohol    Types: 1 Glasses of wine per week    Comment: 2-3 days/week   Drug use: Never   Sexual activity: Yes    Partners: Female  Other Topics Concern   Not on file  Social History Narrative   Married   Social Drivers of Health   Financial Resource Strain: Low Risk  (06/26/2024)   Overall Financial Resource Strain (CARDIA)    Difficulty of Paying Living Expenses: Not hard at all  Food Insecurity: No Food Insecurity (06/26/2024)   Hunger Vital Sign    Worried About Running Out of Food in the Last  Year: Never true    Ran Out of Food in the Last Year: Never true  Transportation Needs: No Transportation Needs (06/26/2024)   PRAPARE - Administrator, Civil Service (Medical): No    Lack of Transportation (Non-Medical): No  Physical Activity: Sufficiently Active (06/26/2024)   Exercise Vital Sign    Days of Exercise per Week: 4 days    Minutes of Exercise per Session: 60 min  Stress: No Stress Concern Present (06/26/2024)   [employer] of Occupational Health - Occupational Stress Questionnaire    Feeling of Stress: Not at all  Social Connections: Moderately Isolated (06/26/2024)   Social Connection and  Isolation Panel    Frequency of Communication with Friends and Family: Three times a week    Frequency of Social Gatherings with Friends and Family: Once a week    Attends Religious Services: Never    Database administrator or Organizations: No    Attends Engineer, structural: Never    Marital Status: Married    Tobacco Counseling Counseling given: Not Answered    Clinical Intake:  Pre-visit preparation completed: Yes  Pain : No/denies pain     BMI - recorded: 25.99 Nutritional Status: BMI 25 -29 Overweight Nutritional Risks: None Diabetes: No  No results found for: HGBA1C   How often do you need to have someone help you when you read instructions, pamphlets, or other written materials from your doctor or pharmacy?: 1 - Never  Interpreter Needed?: No  Information entered by :: Verdie Saba, CMA   Activities of Daily Living     06/26/2024    2:36 PM  In your present state of health, do you have any difficulty performing the following activities:  Hearing? 0  Vision? 0  Difficulty concentrating or making decisions? 0  Walking or climbing stairs? 0  Dressing or bathing? 0  Doing errands, shopping? 0  Preparing Food and eating ? N  Using the Toilet? N  In the past six months, have you accidently leaked urine? N  Do you have problems  with loss of bowel control? N  Managing your Medications? N  Managing your Finances? N  Housekeeping or managing your Housekeeping? N    Patient Care Team: Purcell Emil Schanz, MD as PCP - General (Internal Medicine) Brinda Elsie BRAVO, OD (Optometry)  I have updated your Care Teams any recent Medical Services you may have received from other providers in the past year.     Assessment:   This is a routine wellness examination for Cap.  Hearing/Vision screen Hearing Screening - Comments:: Denies hearing difficulties   Vision Screening - Comments:: Wears rx glasses - up to date with [facility] eye exams [facility]   Goals Addressed               This Visit's Progress     Patient Stated (pt-stated)        Patient stated he plans to continue exercising weekly       Depression Screen     06/26/2024    2:37 PM 03/21/2024    8:11 AM 09/20/2023    8:04 AM 06/23/2023    2:52 PM 03/31/2023    8:40 AM 10/14/2022    1:31 PM 09/15/2022    9:20 AM  PHQ 2/9 Scores  PHQ - 2 Score 0 0 0 0 0 0 0  PHQ- 9 Score 0   3       Fall Risk     06/26/2024    2:36 PM 03/21/2024    8:11 AM 09/20/2023    8:04 AM 06/23/2023    2:50 PM 03/31/2023    8:40 AM  Fall Risk   Falls in the past year? 0 0 0 1 0  Number falls in past yr: 0 0 0 0 0  Injury with Fall? 0 0 0 0   Risk for fall due to : No Fall Risks No Fall Risks No Fall Risks  No Fall Risks  Follow up Falls evaluation completed;Falls prevention discussed Falls evaluation completed Falls evaluation completed Falls evaluation completed;Education provided Falls evaluation completed    MEDICARE RISK AT  HOME:  Medicare Risk at Home Any stairs in or around the home?: No If so, are there any without handrails?: No Home free of loose throw rugs in walkways, pet beds, electrical cords, etc?: Yes Adequate lighting in your home to reduce risk of falls?: Yes Life alert?: No Use of a cane, walker or w/c?: No Grab bars in the bathroom?:  No Shower chair or bench in shower?: No Elevated toilet seat or a handicapped toilet?: No  TIMED UP AND GO:  Was the test performed?  No  Cognitive Function: 6CIT completed        06/26/2024    2:39 PM 06/23/2023    2:52 PM 05/15/2022   11:11 AM 02/01/2020   10:10 AM  6CIT Screen  What Year? 0 points 0 points 0 points 0 points  What month? 0 points 0 points 0 points 0 points  What time? 0 points 0 points 0 points 0 points  Count back from 20 0 points 0 points 0 points 0 points  Months in reverse 0 points 0 points 0 points 0 points  Repeat phrase 0 points 0 points 0 points 0 points  Total Score 0 points 0 points 0 points 0 points    Immunizations Immunization History  Administered Date(s) Administered   Fluad Quad(high Dose 65+) 07/25/2020, 09/15/2022, 06/04/2023   INFLUENZA, HIGH DOSE SEASONAL PF 06/07/2018, 07/03/2019   Influenza-Unspecified 07/05/2017, 07/13/2021   PFIZER(Purple Top)SARS-COV-2 Vaccination 11/14/2019, 12/10/2019, 08/02/2020, 06/10/2023   Pfizer Covid-19 Vaccine Bivalent Booster 61yrs & up 08/24/2022   Pneumococcal Conjugate-13 06/27/2014   Pneumococcal Polysaccharide-23 02/05/2006, 05/15/2013   Rsv, Mab, [place], 0.5 Ml, Neonate To 24 Mos(Beyfortus) 08/24/2022   Tdap 08/16/2007   Zoster Recombinant(Shingrix) 08/24/2022, 06/14/2023    Screening Tests Health Maintenance  Topic Date Due   Hepatitis C Screening  Never done   Influenza Vaccine  05/05/2024   COVID-19 Vaccine (6 - 2025-26 season) 06/05/2024   Lung Cancer Screening  02/20/2025   Medicare Annual Wellness (AWV)  06/26/2025   Colonoscopy  10/14/2027   Pneumococcal Vaccine: 50+ Years  Completed   Zoster Vaccines- Shingrix  Completed   HPV VACCINES  Aged Out   Meningococcal B Vaccine  Aged Out   DTaP/Tdap/Td  Discontinued    Health Maintenance Items Addressed:  Labs Ordered: Hepatitis C Screening   Additional Screening:  Vision Screening: Recommended annual ophthalmology exams  for early detection of glaucoma and other disorders of the eye. Is the patient up to date with their annual eye exam?  Yes  Who is the provider or what is the name of the office in which the patient attends annual eye exams? [facility]  Dental Screening: Recommended annual dental exams for proper oral hygiene  Community Resource Referral / Chronic Care Management: CRR required this visit?  No   CCM required this visit?  No   Plan:    I have personally reviewed and noted the following in the patient's chart:   Medical and social history Use of alcohol, tobacco or illicit drugs  Current medications and supplements including opioid prescriptions. Patient is not currently taking opioid prescriptions. Functional ability and status Nutritional status Physical activity Advanced directives List of other physicians Hospitalizations, surgeries, and ER visits in previous 12 months Vitals Screenings to include cognitive, depression, and falls Referrals and appointments  In addition, I have reviewed and discussed with patient certain preventive protocols, quality metrics, and best practice recommendations. A written personalized care plan for preventive services as well as  general preventive health recommendations were provided to patient.   Verdie CHRISTELLA Saba, CMA   06/26/2024   After Visit Summary: (MyChart) Due to this being a telephonic visit, the after visit summary with patients personalized plan was offered to patient via MyChart   Notes: Nothing significant to report at this time.

## 2024-07-17 DIAGNOSIS — Z23 Encounter for immunization: Secondary | ICD-10-CM | POA: Diagnosis not present

## 2024-07-29 ENCOUNTER — Encounter: Payer: Self-pay | Admitting: Emergency Medicine

## 2024-08-01 ENCOUNTER — Other Ambulatory Visit: Payer: Self-pay | Admitting: Emergency Medicine

## 2024-08-01 DIAGNOSIS — G4700 Insomnia, unspecified: Secondary | ICD-10-CM

## 2024-08-01 MED ORDER — ESZOPICLONE 3 MG PO TABS: 3.0000 mg | ORAL_TABLET | Freq: Every day | ORAL | 1 refills | Status: AC

## 2024-08-01 NOTE — Telephone Encounter (Signed)
 New prescription for Lunesta  sent to pharmacy of record today.

## 2024-08-29 ENCOUNTER — Encounter: Payer: Self-pay | Admitting: Emergency Medicine

## 2024-08-29 ENCOUNTER — Ambulatory Visit: Admitting: Emergency Medicine

## 2024-08-29 VITALS — BP 124/80 | HR 56 | Temp 98.1°F | Ht 69.0 in | Wt 178.0 lb

## 2024-08-29 DIAGNOSIS — M25561 Pain in right knee: Secondary | ICD-10-CM | POA: Diagnosis not present

## 2024-08-29 DIAGNOSIS — F1090 Alcohol use, unspecified, uncomplicated: Secondary | ICD-10-CM

## 2024-08-29 DIAGNOSIS — G8929 Other chronic pain: Secondary | ICD-10-CM | POA: Insufficient documentation

## 2024-08-29 DIAGNOSIS — J449 Chronic obstructive pulmonary disease, unspecified: Secondary | ICD-10-CM | POA: Diagnosis not present

## 2024-08-29 DIAGNOSIS — G4700 Insomnia, unspecified: Secondary | ICD-10-CM

## 2024-08-29 DIAGNOSIS — I700 Atherosclerosis of aorta: Secondary | ICD-10-CM | POA: Diagnosis not present

## 2024-08-29 DIAGNOSIS — R399 Unspecified symptoms and signs involving the genitourinary system: Secondary | ICD-10-CM | POA: Diagnosis not present

## 2024-08-29 NOTE — Assessment & Plan Note (Signed)
 Diet and nutrition discussed. Continue rosuvastatin 10 mg daily.

## 2024-08-29 NOTE — Assessment & Plan Note (Signed)
 Still struggling with symptoms Advised to increase tamsulosin  to twice a day regimen

## 2024-08-29 NOTE — Assessment & Plan Note (Signed)
 Drinks wine 2-3 times per week Normal CMP last June with normal liver enzymes

## 2024-08-29 NOTE — Progress Notes (Signed)
 Kevin Shepard 78 y.o.   Chief Complaint  Patient presents with   Follow-up    Pt is having issues with his right knee he now states that it is getting worse     HISTORY OF PRESENT ILLNESS: This is a 78 y.o. male here for 37-month follow-up of multiple chronic medical conditions Overall doing well. Complaining of chronic pain to right knee.  Needs Ortho evaluation Still having lower urinary tract symptoms.  Tamsulosin  working some but not enough No other complaints or medical concerns today. History of COPD.  Sees pulmonologist on a regular basis  HPI   Prior to Admission medications   Medication Sig Start Date End Date Taking? Authorizing Provider  acyclovir  (ZOVIRAX ) 200 MG capsule TAKE 1 CAPSULE BY MOUTH FOUR TIMES DAILY. AT TIME OF FLARE-UP 05/19/24  Yes Purcell Emil Schanz, MD  albuterol  (PROVENTIL ) (2.5 MG/3ML) 0.083% nebulizer solution Take 3 mLs (2.5 mg total) by nebulization every 6 (six) hours as needed for wheezing or shortness of breath. 01/25/23  Yes Cobb, Comer GAILS, NP  Budeson-Glycopyrrol-Formoterol  (BREZTRI  AEROSPHERE) 160-9-4.8 MCG/ACT AERO Inhale 2 puffs into the lungs in the morning and at bedtime. 10/21/23  Yes Cobb, Comer GAILS, NP  Cholecalciferol (VITAMIN D3 PO) Take by mouth daily. Take one pill daily   Yes [provider]  clobetasol  cream (TEMOVATE ) 0.05 % Apply 1 Application topically 2 (two) times daily. 03/31/23  Yes Ember Gottwald, Emil Schanz, MD  Eszopiclone  3 MG TABS Take 1 tablet (3 mg total) by mouth at bedtime. Take immediately before bedtime 08/01/24  Yes Demitrios Molyneux Jose, MD  METAMUCIL FIBER PO Take by mouth as needed.   Yes [provider]  Multiple Vitamin (MULTIVITAMIN ADULT PO) Take by mouth daily. Take tablet daily   Yes [provider]  PREVIDENT 5000 BOOSTER PLUS 1.1 % PSTE SMARTSIG:Sparingly To Teeth 05/14/21  Yes [provider]  rosuvastatin  (CRESTOR ) 10 MG tablet TAKE 1 TABLET(10 MG) BY MOUTH DAILY 03/29/24   Yes [place], Emil Schanz, MD  tamsulosin  (FLOMAX ) 0.4 MG CAPS capsule TAKE 1 CAPSULE(0.4 MG) BY MOUTH DAILY 12/03/23  Yes Phuong Hillary, [place], MD  [facility] injection Inject 0.5 mLs into the muscle once. Patient not taking: Reported on 08/29/2024 06/14/23   [provider]  traZODone  (DESYREL ) 50 MG tablet Take 0.5-1 tablets (25-50 mg total) by mouth at bedtime as needed for sleep. 03/21/24   Purcell Emil Schanz, MD  VENTOLIN  HFA 108 [id number] Base) MCG/ACT inhaler INHALE 1 TO 2 PUFFS INTO THE LUNGS EVERY 6 HOURS AS NEEDED FOR WHEEZING OR SHORTNESS OF BREATH Patient not taking: Reported on 08/29/2024 05/04/23   Brenna Adine CROME, DO    No Known Allergies  Patient Active Problem List   Diagnosis Date Noted   Chronic constipation 02/10/2022   Lower urinary tract symptoms 02/10/2022   Dyslipidemia 01/29/2021   Atherosclerosis of aorta 01/29/2021   Insomnia 01/29/2021   Atherosclerosis of native coronary artery of native heart without angina pectoris 01/29/2021   Seborrheic keratoses 04/11/2020   Actinic keratoses 04/11/2020   COPD  GOLD II/ Group A 06/02/2018   Lung nodules 06/02/2018   Former smoker 04/06/2018   Alcohol use 01/10/2018   Chronic bilateral low back pain without sciatica 01/10/2018   History of tobacco use 01/10/2018   History of back surgery 01/10/2018    Past Medical History:  Diagnosis Date   Blood transfusion without reported diagnosis    Cataract    beginning   Colon polyps    COPD (  chronic obstructive pulmonary disease) (HCC)    Hyperlipidemia     Past Surgical History:  Procedure Laterality Date   APPENDECTOMY     per patient 1950's   COLONOSCOPY     Last 02/19/2017 [facility] [place] -for an 8 mm polyps rectum and hepatic flexure with history of polyps prior   FRACTURE SURGERY  1974   right clavicle   LUMBAR SPINE SURGERY  2018   SPINE SURGERY  10/2016   TONSILLECTOMY      Social History   Socioeconomic History   Marital status:  Married    Spouse name: Not on file   Number of children: Not on file   Years of education: Not on file   Highest education level: Not on file  Occupational History   Occupation: stock market trading  Tobacco Use   Smoking status: Former    Current packs/day: 0.00    Average packs/day: 1 pack/day for 40.0 years (40.0 ttl pk-yrs)    Types: Cigarettes    Start date: 10/04/1977    Quit date: 10/04/2017    Years since quitting: 6.9    Passive exposure: Past   Smokeless tobacco: Never  Vaping Use   Vaping status: Never Used  Substance and Sexual Activity   Alcohol use: Yes    Alcohol/week: 1.0 standard drink of alcohol    Types: 1 Glasses of wine per week    Comment: 2-3 days/week   Drug use: Never   Sexual activity: Yes    Partners: Female  Other Topics Concern   Not on file  Social History Narrative   Married   Social Drivers of Health   Financial Resource Strain: Low Risk  (06/26/2024)   Overall Financial Resource Strain (CARDIA)    Difficulty of Paying Living Expenses: Not hard at all  Food Insecurity: No Food Insecurity (06/26/2024)   Hunger Vital Sign    Worried About Running Out of Food in the Last Year: Never true    Ran Out of Food in the Last Year: Never true  Transportation Needs: No Transportation Needs (06/26/2024)   PRAPARE - Administrator, Civil Service (Medical): No    Lack of Transportation (Non-Medical): No  Physical Activity: Sufficiently Active (06/26/2024)   Exercise Vital Sign    Days of Exercise per Week: 4 days    Minutes of Exercise per Session: 60 min  Stress: No Stress Concern Present (06/26/2024)   [employer] of Occupational Health - Occupational Stress Questionnaire    Feeling of Stress: Not at all  Social Connections: Moderately Isolated (06/26/2024)   Social Connection and Isolation Panel    Frequency of Communication with Friends and Family: Three times a week    Frequency of Social Gatherings with Friends and Family: Once  a week    Attends Religious Services: Never    Database Administrator or Organizations: No    Attends Banker Meetings: Never    Marital Status: Married  Catering Manager Violence: Not At Risk (06/26/2024)   Humiliation, Afraid, Rape, and Kick questionnaire    Fear of Current or Ex-Partner: No    Emotionally Abused: No    Physically Abused: No    Sexually Abused: No    Family History  Problem Relation Age of Onset   Cancer Mother        Breast   Early death Father    Heart disease Father 71       rheumatic heart disease  Hyperlipidemia Sister    Colon cancer Neg Hx    Colon polyps Neg Hx    Crohn's disease Neg Hx    Esophageal cancer Neg Hx    Rectal cancer Neg Hx    Stomach cancer Neg Hx    Ulcerative colitis Neg Hx      Review of Systems  Constitutional: Negative.  Negative for chills and fever.  HENT: Negative.  Negative for congestion and sore throat.   Respiratory: Negative.  Negative for cough and shortness of breath.   Cardiovascular: Negative.  Negative for chest pain and palpitations.  Gastrointestinal:  Negative for abdominal pain, diarrhea, nausea and vomiting.  Genitourinary: Negative.  Negative for dysuria and hematuria.  Musculoskeletal:  Positive for joint pain.  Skin: Negative.  Negative for rash.  Neurological: Negative.  Negative for dizziness and headaches.  All other systems reviewed and are negative.   Vitals:   08/29/24 0804  BP: 124/80  Pulse: (!) 56  Temp: 98.1 F (36.7 C)  SpO2: 97%    Physical Exam Vitals reviewed.  Constitutional:      Appearance: Normal appearance.  HENT:     Head: Normocephalic.     Mouth/Throat:     Mouth: Mucous membranes are moist.     Pharynx: Oropharynx is clear.  Eyes:     Extraocular Movements: Extraocular movements intact.     Conjunctiva/sclera: Conjunctivae normal.     Pupils: Pupils are equal, round, and reactive to light.  Cardiovascular:     Rate and Rhythm: Normal rate and  regular rhythm.     Pulses: Normal pulses.     Heart sounds: Normal heart sounds.  Pulmonary:     Effort: Pulmonary effort is normal.     Breath sounds: Normal breath sounds.  Abdominal:     Palpations: Abdomen is soft.     Tenderness: There is no abdominal tenderness.  Musculoskeletal:     Cervical back: No tenderness.  Lymphadenopathy:     Cervical: No cervical adenopathy.  Skin:    General: Skin is warm and dry.     Capillary Refill: Capillary refill takes less than 2 seconds.  Neurological:     General: No focal deficit present.     Mental Status: He is alert and oriented to person, place, and time.  Psychiatric:        Mood and Affect: Mood normal.        Behavior: Behavior normal.      ASSESSMENT & PLAN: A total of 42 minutes was spent with the patient and counseling/coordination of care regarding preparing for this visit, review of most recent office visit notes, review of multiple chronic medical conditions and their management, review of all medications, review of most recent bloodwork results, review of health maintenance items, education on nutrition, prognosis, documentation, and need for follow up.   Problem List Items Addressed This Visit       Cardiovascular and Mediastinum   Atherosclerosis of aorta   Diet and nutrition discussed Continue rosuvastatin  10 mg daily         Respiratory   COPD  GOLD II/ Group A - Primary   Moderate COPD. No recent exacerbations or hospitalizations. He has been on chronic macrolide therapy for quite some time now. Given risks of long term use and stability of symptoms, will discontinue and monitor response. EKG today with stable QTc and unchanged from prior. Encouraged to utilize Breztri  consistently. Aware of role of maintenance vs rescue. Action plan in place.  Graded exercises. Trigger prevention. UTD on vaccines.   Patient Instructions  Continue Albuterol  inhaler 2 puffs or 3 mL neb every 6 hours as needed for shortness of  breath or wheezing. Notify if symptoms persist despite rescue inhaler/neb use. Continue Breztri  2 puff Twice daily. Brush tongue and rinse mouth afterwards Stop azithromycin    Repeat CT chest in one year. You've aged out of the lung cancer screening program but we will continue to monitor given your smoking history    Make sure you continue to stay active         Other   Alcohol use   Drinks wine 2-3 times per week Normal CMP last June with normal liver enzymes      Insomnia   Chronic problem. Continues Lunesta  3 mg at bedtime      Lower urinary tract symptoms   Still struggling with symptoms Advised to increase tamsulosin  to twice a day regimen      Chronic pain of right knee   Active and slowly getting worse.  Affecting quality of life. Pain management discussed. Recommend orthopedic evaluation.  Referral placed today.      Relevant Orders   Ambulatory referral to Orthopedic Surgery   Patient Instructions  Health Maintenance After Age 82 After age 69, you are at a higher risk for certain long-term diseases and infections as well as injuries from falls. Falls are a major cause of broken bones and head injuries in people who are older than age 8. Getting regular preventive care can help to keep you healthy and well. Preventive care includes getting regular testing and making lifestyle changes as recommended by your health care provider. Talk with your health care provider about: Which screenings and tests you should have. A screening is a test that checks for a disease when you have no symptoms. A diet and exercise plan that is right for you. What should I know about screenings and tests to prevent falls? Screening and testing are the best ways to find a health problem early. Early diagnosis and treatment give you the best chance of managing medical conditions that are common after age 45. Certain conditions and lifestyle choices may make you more likely to have a fall. Your  health care provider may recommend: Regular vision checks. Poor vision and conditions such as cataracts can make you more likely to have a fall. If you wear glasses, make sure to get your prescription updated if your vision changes. Medicine review. Work with your health care provider to regularly review all of the medicines you are taking, including over-the-counter medicines. Ask your health care provider about any side effects that may make you more likely to have a fall. Tell your health care provider if any medicines that you take make you feel dizzy or sleepy. Strength and balance checks. Your health care provider may recommend certain tests to check your strength and balance while standing, walking, or changing positions. Foot health exam. Foot pain and numbness, as well as not wearing proper footwear, can make you more likely to have a fall. Screenings, including: Osteoporosis screening. Osteoporosis is a condition that causes the bones to get weaker and break more easily. Blood pressure screening. Blood pressure changes and medicines to control blood pressure can make you feel dizzy. Depression screening. You may be more likely to have a fall if you have a fear of falling, feel depressed, or feel unable to do activities that you used to do. Alcohol use screening. Using too much  alcohol can affect your balance and may make you more likely to have a fall. Follow these instructions at home: Lifestyle Do not drink alcohol if: Your health care provider tells you not to drink. If you drink alcohol: Limit how much you have to: 0-1 drink a day for women. 0-2 drinks a day for men. Know how much alcohol is in your drink. In the [place] one drink equals one 12 oz bottle of beer (355 mL), one 5 oz glass of wine (148 mL), or one 1 oz glass of hard liquor (44 mL). Do not use any products that contain nicotine or tobacco. These products include cigarettes, chewing tobacco, and vaping devices, such as  e-cigarettes. If you need help quitting, ask your health care provider. Activity  Follow a regular exercise program to stay fit. This will help you maintain your balance. Ask your health care provider what types of exercise are appropriate for you. If you need a cane or walker, use it as recommended by your health care provider. Wear supportive shoes that have nonskid soles. Safety  Remove any tripping hazards, such as rugs, cords, and clutter. Install safety equipment such as grab bars in bathrooms and safety rails on stairs. Keep rooms and walkways well-lit. General instructions Talk with your health care provider about your risks for falling. Tell your health care provider if: You fall. Be sure to tell your health care provider about all falls, even ones that seem minor. You feel dizzy, tiredness (fatigue), or off-balance. Take over-the-counter and prescription medicines only as told by your health care provider. These include supplements. Eat a healthy diet and maintain a healthy weight. A healthy diet includes low-fat dairy products, low-fat (lean) meats, and fiber from whole grains, beans, and lots of fruits and vegetables. Stay current with your vaccines. Schedule regular health, dental, and eye exams. Summary Having a healthy lifestyle and getting preventive care can help to protect your health and wellness after age 101. Screening and testing are the best way to find a health problem early and help you avoid having a fall. Early diagnosis and treatment give you the best chance for managing medical conditions that are more common for people who are older than age 10. Falls are a major cause of broken bones and head injuries in people who are older than age 70. Take precautions to prevent a fall at home. Work with your health care provider to learn what changes you can make to improve your health and wellness and to prevent falls. This information is not intended to replace advice given  to you by your health care provider. Make sure you discuss any questions you have with your health care provider. Document Revised: 02/10/2021 Document Reviewed: 02/10/2021 Elsevier Patient Education  2024 Elsevier Inc.      Emil Schaumann, MD [facility]

## 2024-08-29 NOTE — Progress Notes (Signed)
 Kevin Shepard

## 2024-08-29 NOTE — Assessment & Plan Note (Signed)
 Active and slowly getting worse.  Affecting quality of life. Pain management discussed. Recommend orthopedic evaluation.  Referral placed today.

## 2024-08-29 NOTE — Assessment & Plan Note (Signed)
 Chronic problem. Continues Lunesta  3 mg at bedtime

## 2024-08-29 NOTE — Assessment & Plan Note (Signed)
 Moderate COPD. No recent exacerbations or hospitalizations. He has been on chronic macrolide therapy for quite some time now. Given risks of long term use and stability of symptoms, will discontinue and monitor response. EKG today with stable QTc and unchanged from prior. Encouraged to utilize Breztri  consistently. Aware of role of maintenance vs rescue. Action plan in place. Graded exercises. Trigger prevention. UTD on vaccines.   Patient Instructions  Continue Albuterol  inhaler 2 puffs or 3 mL neb every 6 hours as needed for shortness of breath or wheezing. Notify if symptoms persist despite rescue inhaler/neb use. Continue Breztri  2 puff Twice daily. Brush tongue and rinse mouth afterwards Stop azithromycin    Repeat CT chest in one year. You've aged out of the lung cancer screening program but we will continue to monitor given your smoking history    Make sure you continue to stay active

## 2024-08-29 NOTE — Patient Instructions (Signed)
 Health Maintenance After Age 78 After age 27, you are at a higher risk for certain long-term diseases and infections as well as injuries from falls. Falls are a major cause of broken bones and head injuries in people who are older than age 73. Getting regular preventive care can help to keep you healthy and well. Preventive care includes getting regular testing and making lifestyle changes as recommended by your health care provider. Talk with your health care provider about: Which screenings and tests you should have. A screening is a test that checks for a disease when you have no symptoms. A diet and exercise plan that is right for you. What should I know about screenings and tests to prevent falls? Screening and testing are the best ways to find a health problem early. Early diagnosis and treatment give you the best chance of managing medical conditions that are common after age 90. Certain conditions and lifestyle choices may make you more likely to have a fall. Your health care provider may recommend: Regular vision checks. Poor vision and conditions such as cataracts can make you more likely to have a fall. If you wear glasses, make sure to get your prescription updated if your vision changes. Medicine review. Work with your health care provider to regularly review all of the medicines you are taking, including over-the-counter medicines. Ask your health care provider about any side effects that may make you more likely to have a fall. Tell your health care provider if any medicines that you take make you feel dizzy or sleepy. Strength and balance checks. Your health care provider may recommend certain tests to check your strength and balance while standing, walking, or changing positions. Foot health exam. Foot pain and numbness, as well as not wearing proper footwear, can make you more likely to have a fall. Screenings, including: Osteoporosis screening. Osteoporosis is a condition that causes  the bones to get weaker and break more easily. Blood pressure screening. Blood pressure changes and medicines to control blood pressure can make you feel dizzy. Depression screening. You may be more likely to have a fall if you have a fear of falling, feel depressed, or feel unable to do activities that you used to do. Alcohol  use screening. Using too much alcohol  can affect your balance and may make you more likely to have a fall. Follow these instructions at home: Lifestyle Do not drink alcohol  if: Your health care provider tells you not to drink. If you drink alcohol : Limit how much you have to: 0-1 drink a day for women. 0-2 drinks a day for men. Know how much alcohol  is in your drink. In the [place] one drink equals one 12 oz bottle of beer (355 mL), one 5 oz glass of wine (148 mL), or one 1 oz glass of hard liquor (44 mL). Do not use any products that contain nicotine or tobacco. These products include cigarettes, chewing tobacco, and vaping devices, such as e-cigarettes. If you need help quitting, ask your health care provider. Activity  Follow a regular exercise program to stay fit. This will help you maintain your balance. Ask your health care provider what types of exercise are appropriate for you. If you need a cane or walker, use it as recommended by your health care provider. Wear supportive shoes that have nonskid soles. Safety  Remove any tripping hazards, such as rugs, cords, and clutter. Install safety equipment such as grab bars in bathrooms and safety rails on stairs. Keep rooms and walkways  well-lit. General instructions Talk with your health care provider about your risks for falling. Tell your health care provider if: You fall. Be sure to tell your health care provider about all falls, even ones that seem minor. You feel dizzy, tiredness (fatigue), or off-balance. Take over-the-counter and prescription medicines only as told by your health care provider. These include  supplements. Eat a healthy diet and maintain a healthy weight. A healthy diet includes low-fat dairy products, low-fat (lean) meats, and fiber from whole grains, beans, and lots of fruits and vegetables. Stay current with your vaccines. Schedule regular health, dental, and eye exams. Summary Having a healthy lifestyle and getting preventive care can help to protect your health and wellness after age 15. Screening and testing are the best way to find a health problem early and help you avoid having a fall. Early diagnosis and treatment give you the best chance for managing medical conditions that are more common for people who are older than age 42. Falls are a major cause of broken bones and head injuries in people who are older than age 64. Take precautions to prevent a fall at home. Work with your health care provider to learn what changes you can make to improve your health and wellness and to prevent falls. This information is not intended to replace advice given to you by your health care provider. Make sure you discuss any questions you have with your health care provider. Document Revised: 02/10/2021 Document Reviewed: 02/10/2021 Elsevier Patient Education  2024 [employer].

## 2024-09-05 ENCOUNTER — Ambulatory Visit (INDEPENDENT_AMBULATORY_CARE_PROVIDER_SITE_OTHER)

## 2024-09-05 ENCOUNTER — Encounter: Payer: Self-pay | Admitting: Nurse Practitioner

## 2024-09-05 ENCOUNTER — Ambulatory Visit: Payer: Self-pay | Admitting: Nurse Practitioner

## 2024-09-05 ENCOUNTER — Ambulatory Visit: Admitting: Nurse Practitioner

## 2024-09-05 ENCOUNTER — Other Ambulatory Visit: Payer: Self-pay | Admitting: Nurse Practitioner

## 2024-09-05 VITALS — BP 122/66 | HR 72 | Ht 69.0 in | Wt 177.8 lb

## 2024-09-05 DIAGNOSIS — J449 Chronic obstructive pulmonary disease, unspecified: Secondary | ICD-10-CM

## 2024-09-05 DIAGNOSIS — L570 Actinic keratosis: Secondary | ICD-10-CM | POA: Diagnosis not present

## 2024-09-05 DIAGNOSIS — J984 Other disorders of lung: Secondary | ICD-10-CM | POA: Diagnosis not present

## 2024-09-05 DIAGNOSIS — R252 Cramp and spasm: Secondary | ICD-10-CM | POA: Diagnosis not present

## 2024-09-05 DIAGNOSIS — Z1283 Encounter for screening for malignant neoplasm of skin: Secondary | ICD-10-CM | POA: Diagnosis not present

## 2024-09-05 DIAGNOSIS — L820 Inflamed seborrheic keratosis: Secondary | ICD-10-CM | POA: Diagnosis not present

## 2024-09-05 DIAGNOSIS — R911 Solitary pulmonary nodule: Secondary | ICD-10-CM | POA: Diagnosis not present

## 2024-09-05 DIAGNOSIS — R918 Other nonspecific abnormal finding of lung field: Secondary | ICD-10-CM

## 2024-09-05 DIAGNOSIS — R0602 Shortness of breath: Secondary | ICD-10-CM | POA: Diagnosis not present

## 2024-09-05 DIAGNOSIS — X32XXXD Exposure to sunlight, subsequent encounter: Secondary | ICD-10-CM | POA: Diagnosis not present

## 2024-09-05 DIAGNOSIS — D225 Melanocytic nevi of trunk: Secondary | ICD-10-CM | POA: Diagnosis not present

## 2024-09-05 DIAGNOSIS — J209 Acute bronchitis, unspecified: Secondary | ICD-10-CM

## 2024-09-05 LAB — BASIC METABOLIC PANEL WITH GFR
BUN: 19 mg/dL (ref 6–23)
CO2: 32 meq/L (ref 19–32)
Calcium: 9.6 mg/dL (ref 8.4–10.5)
Chloride: 102 meq/L (ref 96–112)
Creatinine, Ser: 1.15 mg/dL (ref 0.40–1.50)
GFR: 61.18 mL/min (ref >60.00)
Glucose, Bld: 86 mg/dL (ref 70–99)
Potassium: 4.5 meq/L (ref 3.5–5.1)
Sodium: 139 meq/L (ref 135–145)

## 2024-09-05 LAB — PHOSPHORUS: Phosphorus: 3.1 mg/dL (ref 2.3–4.6)

## 2024-09-05 LAB — MAGNESIUM: Magnesium: 2.1 mg/dL (ref 1.5–2.5)

## 2024-09-05 LAB — NITRIC OXIDE: FeNO level (ppb): 23

## 2024-09-05 MED ORDER — PREDNISONE 20 MG PO TABS: 40.0000 mg | ORAL_TABLET | Freq: Every day | ORAL | 0 refills | Status: AC

## 2024-09-05 MED ORDER — AZITHROMYCIN 250 MG PO TABS: ORAL_TABLET | ORAL | 0 refills | Status: AC

## 2024-09-05 NOTE — Progress Notes (Signed)
 Please let pt know there was a small density in the right lung, which could be early pneumonia or changes of bronchitis. I'm sending in a z pack as well as the prednisone . Take as prescribed. Plan repeat chest x ray at follow up. ED precautions should he develop severe symptoms of dyspnea, fatigue, worsening cough, low oxygen levels, etc

## 2024-09-05 NOTE — Progress Notes (Signed)
 @Patient  ID: Kevin Shepard, male    DOB: 11/26/1945, 78 y.o.   MRN: [id number]  Chief Complaint  Patient presents with   Follow-up    6 month follow up COPD , notice he is having more chest congestion , more coughing,no coughing up anything with the cough     Referring provider: Purcell Emil Schanz, *  HPI: 78 year old male, former smoker followed for COPD with chronic bronchitis. He is a former patient of Dr. Harriet and last seen in office 03/17/2024 by [facility] NP. He is followed by the lung cancer screening program. Past medical history significant for HLD, insomnia, chronic constipation, BPH.  TEST/EVENTS:  08/20/2022 LDCT chest: atherosclerosis. Nonenlarged coarsely calcified subcarinal and right hilar nodes, unchanged and compatible with prior granulomatous disease. Mild centrilobular emphysema and diffuse bronchial wall thickening. 4.3 RML nodule has resolved. Lung RADS 2. 11/11/2022 sputum culture positive for moraxella cattarrhalis, AFB neg 11/25/2022 PFT: FVC 61, FEV1 57, ratio 74, TLC 80, DLCOcor 77. Positive BD (16%) 08/23/2023 LDCT chest: Lung RADS 2. Atherosclerosis. Emphysema 02/21/2024 LDCT chest: atherosclerosis. Emphysema. Stable small calcified mediastinal and hilar nodes, consistent with prior granulomatous disease. Lung RADS 2  12/11/2022: OV with Dr. Brenna. He had re-established care back in January 2024. Chronic cough for the past 3 months.  Started on Trelegy 1 puff daily and as needed albuterol .  He then was seen again in February 2024.  Was awaiting culture for AFB.  Had positive sputum culture with Moraxella catarrhalis.  AFB ended up being negative.  He also had pulmonary function testing completed which showed FEV1 of 67% and positive bronchodilator response.  Presented for follow-up.  Still having daily cough and sputum production, despite doxycycline  course.  Seems to be more of a chronic bronchitis problem.  Advised starting azithromycin  on Monday Wednesday Friday.  Plan  for repeat EKG in 6 weeks.  01/25/2023: OV with Kampbell Holaway NP for follow up after being started on chronic macrolide therapy with azithromycin  M/W/F. He tells me he is feeling much better. He has not noticed any more wheezing and his breathing feels pretty good. He's able to exercise again and walk long distances without trouble. His cough is minimal; usually only when he gets a dry throat. He's producing very little phlegm. No concerns or complaints today. Just curious what he should do with his neb treatments. He is using his Trelegy daily.   05/07/2023: OV with Tamas Suen NP for follow up. Doing well on chronic macrolide therapy. No hearing changes. Breathing feels like it is stable. Able to complete activities without limitations. He does have some occasional chest congestion but usually resolves on his own. Minimal cough; usually only when he feels he needs to clear his throat. Not having to use SABA. On Trelegy daily.   07/22/2024: OV with Dr. Brenna. F/u. Doing well from respiratory standpoint. Using Trelegy; some scratchy throat. Takes azithromycin  M/W/F. Switched to Breztri .   03/17/2024: Ov with Caitlen Worth NP Discussed the use of AI scribe software for clinical note transcription with the patient, who gave verbal consent to proceed. Kevin Shepard is a 78 year old male with COPD who presents for follow-up of lung nodules and respiratory symptoms. He has a history of smoking with 40 pack year history and quit in 2018. He has been part of a lung cancer screening program and recently had a CT scan which showed stable lung nodules. he has aged out of the program. He experiences shortness of breath, which is stable and  not worse than before. He gets short winded periodically, during activities like stair climbing or walking on inclines, but can walk on flat surfaces and complete ADLs without difficulty.  He uses the Breztri  inhaler. He sometimes skips using it due to experiencing a sore throat. He is also on  azithromycin  three times a week (Mondays, Wednesdays, and Fridays) and has not had a flare-up requiring antibiotics or steroids for a long time. No hospitalizations.  He rarely uses his rescue or nebulizer. No current cough.     09/05/2024: Today - follow up Discussed the use of AI scribe software for clinical note transcription with the patient, who gave verbal consent to proceed.  History of Present Illness Kevin Shepard is a 78 year old male with COPD and asthma who presents with increased chest and nasal congestion.  He has experienced increased congestion in his chest and nose over the past couple of weeks. He has a mild cough and is concerned about potential worsening. No colored sputum production or wheezing is noted, but there is increased shortness of breath compared to his baseline. No fever, hemoptysis, chest pain, or leg swelling. No known sick exposures.   His current medication regimen includes Breztri , which he's not using consistently. He rarely uses albuterol  for shortness of breath. He is no longer on azithromycin  and is not taking any antibiotics. No recent steroid use. Mucinex  is used occasionally. No changes in appetite or hydration. No weight loss.   He experiences leg cramps at night, occurring once every week or two, without associated leg swelling. No other associated symptoms. This has been going on for the last few months.   He is planning to travel out of the country for two months, returning in mid-February.    No Known Allergies  Immunization History  Administered Date(s) Administered   Fluad Quad(high Dose 65+) 07/25/2020, 09/15/2022, 06/04/2023   INFLUENZA, HIGH DOSE SEASONAL PF 06/07/2018, 07/03/2019   Influenza-Unspecified 07/05/2017, 07/13/2021, 07/18/2024   PFIZER(Purple Top)SARS-COV-2 Vaccination 11/14/2019, 12/10/2019, 08/02/2020, 06/10/2023   Pfizer Covid-19 Vaccine Bivalent Booster 19yrs & up 08/24/2022   Pneumococcal Conjugate-13 06/27/2014    Pneumococcal Polysaccharide-23 02/05/2006, 05/15/2013   Rsv, Mab, [place], 0.5 Ml, Neonate To 24 Mos(Beyfortus) 08/24/2022   Tdap 08/16/2007   Unspecified SARS-COV-2 Vaccination 07/18/2024   Zoster Recombinant(Shingrix) 08/24/2022, 06/14/2023    Past Medical History:  Diagnosis Date   Blood transfusion without reported diagnosis    Cataract    beginning   Colon polyps    COPD (chronic obstructive pulmonary disease) (HCC)    Hyperlipidemia     Tobacco History: Social History   Tobacco Use  Smoking Status Former   Current packs/day: 0.00   Average packs/day: 1 pack/day for 40.0 years (40.0 ttl pk-yrs)   Types: Cigarettes   Start date: 10/04/1977   Quit date: 10/04/2017   Years since quitting: 6.9   Passive exposure: Past  Smokeless Tobacco Never   Counseling given: Not Answered   Outpatient Medications Prior to Visit  Medication Sig Dispense Refill   acyclovir  (ZOVIRAX ) 200 MG capsule TAKE 1 CAPSULE BY MOUTH FOUR TIMES DAILY. AT TIME OF FLARE-UP 28 capsule 1   albuterol  (PROVENTIL ) (2.5 MG/3ML) 0.083% nebulizer solution Take 3 mLs (2.5 mg total) by nebulization every 6 (six) hours as needed for wheezing or shortness of breath. 75 mL 12   Budeson-Glycopyrrol-Formoterol  (BREZTRI  AEROSPHERE) 160-9-4.8 MCG/ACT AERO Inhale 2 puffs into the lungs in the morning and at bedtime. 10.7 g 2   Cholecalciferol (VITAMIN D3  PO) Take by mouth daily. Take one pill daily     clobetasol  cream (TEMOVATE ) 0.05 % Apply 1 Application topically 2 (two) times daily. 30 g 0   Eszopiclone  3 MG TABS Take 1 tablet (3 mg total) by mouth at bedtime. Take immediately before bedtime 30 tablet 1   METAMUCIL FIBER PO Take by mouth as needed.     Multiple Vitamin (MULTIVITAMIN ADULT PO) Take by mouth daily. Take tablet daily     PREVIDENT 5000 BOOSTER PLUS 1.1 % PSTE SMARTSIG:Sparingly To Teeth     rosuvastatin  (CRESTOR ) 10 MG tablet TAKE 1 TABLET(10 MG) BY MOUTH DAILY 90 tablet 3   tamsulosin   (FLOMAX ) 0.4 MG CAPS capsule TAKE 1 CAPSULE(0.4 MG) BY MOUTH DAILY 90 capsule 3   VENTOLIN  HFA 108 (90 Base) MCG/ACT inhaler INHALE 1 TO 2 PUFFS INTO THE LUNGS EVERY 6 HOURS AS NEEDED FOR WHEEZING OR SHORTNESS OF BREATH 18 g 1   SHINGRIX injection Inject 0.5 mLs into the muscle once. (Patient not taking: Reported on 09/05/2024)     No facility-administered medications prior to visit.     Review of Systems: as above    Physical Exam:  Ht 5' 9 (1.753 m)   Wt 177 lb 12.8 oz (80.6 kg)   BMI 26.26 kg/m   GEN: Pleasant, interactive, well-appearing; in no acute distress HEENT:  Normocephalic and atraumatic. PERRLA. Sclera white. Nasal turbinates pink, moist and patent bilaterally. No rhinorrhea present. Oropharynx pink and moist, without exudate or edema. No lesions, ulcerations, or postnasal drip.  NECK:  Supple w/ fair ROM. Thyroid  symmetrical with no goiter or nodules palpated. No lymphadenopathy.   CV: RRR, no m/r/g, no peripheral edema. Pulses intact, +2 bilaterally. No cyanosis, pallor or clubbing. PULMONARY:  Unlabored, regular breathing. Diminished bilaterally A&P w/o wheezes/rales/rhonchi. No accessory muscle use.  GI: BS present and normoactive. Soft, non-tender to palpation.  MSK: No erythema, warmth or tenderness. Cap refil <2 sec all extrem.  Neuro: A/Ox3. No focal deficits noted.   Skin: Warm, no lesions or rashe Psych: Normal affect and behavior. Judgement and thought content appropriate.     Lab Results:  CBC    Component Value Date/Time   WBC 5.6 09/20/2023 0827   RBC 4.39 09/20/2023 0827   HGB 14.0 09/20/2023 0827   HGB 13.6 07/18/2019 1113   HCT 41.1 09/20/2023 0827   HCT 40.8 07/18/2019 1113   PLT 243.0 09/20/2023 0827   PLT 295 07/18/2019 1113   MCV 93.6 09/20/2023 0827   MCV 92 07/18/2019 1113   MCH 30.9 10/30/2022 1534   MCHC 34.2 09/20/2023 0827   RDW 13.9 09/20/2023 0827   RDW 12.9 07/18/2019 1113   LYMPHSABS 2.0 09/20/2023 0827   LYMPHSABS 2.2  07/18/2019 1113   MONOABS 0.5 09/20/2023 0827   EOSABS 0.3 09/20/2023 0827   EOSABS 0.3 07/18/2019 1113   BASOSABS 0.0 09/20/2023 0827   BASOSABS 0.0 07/18/2019 1113    BMET    Component Value Date/Time   NA 138 03/21/2024 0836   NA 140 07/31/2020 1020   K 4.3 03/21/2024 0836   CL 104 03/21/2024 0836   CO2 28 03/21/2024 0836   GLUCOSE 98 03/21/2024 0836   BUN 14 03/21/2024 0836   BUN 12 07/31/2020 1020   CREATININE 0.95 03/21/2024 0836   CALCIUM  9.1 03/21/2024 0836   GFRNONAA 73 07/31/2020 1020   GFRAA 85 07/31/2020 1020    BNP No results found for: BNP   Imaging:  No results found.  Administration History     None          Latest Ref Rng & Units 11/25/2022    9:02 AM  PFT Results  FVC-Pre L 2.49   FVC-Predicted Pre % 61   FVC-Post L 2.64   FVC-Predicted Post % 65   Pre FEV1/FVC % % 68   Post FEV1/FCV % % 74   FEV1-Pre L 1.68   FEV1-Predicted Pre % 57   FEV1-Post L 1.96   DLCO uncorrected ml/min/mmHg 18.11   DLCO UNC% % 74   DLCO corrected ml/min/mmHg 18.66   DLCO COR %Predicted % 77   DLVA Predicted % 93   TLC L 5.50   TLC % Predicted % 80   RV % Predicted % 96     No results found for: NITRICOXIDE      Assessment & Plan:   Assessment & Plan COPD mixed type Mild possible AECOPD with increased congestion and dyspnea. No sputum production or infectious symptoms. Will hold off on empiric abx and obtain CXR to rule out superimposed infection. Exhaled nitric oxide testing not significantly elevated. Prior peripheral eosinophilia noted. Treat with prednisone  burst and reassess response. Side effect profile reviewed. Advised consistent use of Breztri . Discussed potential use of injectable medications like Nucala or Dupixent for better long-term management and reduced side effects if consistent use of maintenance inhaler not optimally controlling symptoms. Action plan in place. Vital signs stable on room air.  - Increase Breztri  to two puffs  twice daily. - Prescribed a short course of prednisone , 40 mg daily for 5 days, to be taken in the morning with food. - Use albuterol  as needed for shortness of breath. - Purchase a travel nebulizer for use while abroad. - Use Mucinex  1-2 times daily for congestion - Ordered chest x-ray to rule out superimposed infection  - Will consider injectable medications like Nucala or Dupixent if symptoms persist after increasing Breztri  usage.  Leg cramps Intermittent leg cramps, primarily at night, occurring once every one to two weeks for the last few months. Not related to COPD flare. Discussed potential causes and home remedies. Consideration of electrolyte imbalance or nocturnal hypoxia as contributing factors.  - Try tonic water, pickle juice, or mustard for leg cramps. - Monitor leg cramps and consider overnight oxygen study if no improvement  - Check electrolytes to rule out imbalance. - Follow up with PCP   Lung nodule Aged out of lung cancer screening. Lung RADS 2 on 02/2024. Plan to continue annual surveillance CTs given smoking hx. Repeat in May 2026 as previously ordered.       I spent 35 minutes of dedicated to the care of this patient on the date of this encounter to include pre-visit review of records, face-to-face time with the patient discussing conditions above, post visit ordering of testing, clinical documentation with the electronic health record, making appropriate referrals as documented, and communicating necessary findings to members of the patients care team.  Comer LULLA Rouleau, NP 09/05/2024  Pt aware and understands NP's role.

## 2024-09-05 NOTE — Patient Instructions (Addendum)
 Continue Albuterol  inhaler 2 puffs or 3 mL neb every 6 hours as needed for shortness of breath or wheezing. Notify if symptoms persist despite rescue inhaler/neb use. Increase use of Breztri  to 2 puff Twice daily. Brush tongue and rinse mouth afterwards  Prednisone  40 mg daily for 5 days. Take in AM with food Use guaifenesin  [phone] mg Twice daily as needed for cough/congestion   Labs and chest x ray today  If you're still having difficulties with your breathing after being more consistent with your inhaler, then we can consider an injectable COPD/asthma medication to help manage symptoms    Repeat CT chest in May, as previously ordered   Make sure you continue to stay active   Try tonic water at bedtime to see if this helps with the leg cramps. Let me know if not    Follow up with new pulmonologist. If symptoms do not improve or worsen, please contact office for sooner follow up or seek emergency care.

## 2024-09-07 ENCOUNTER — Ambulatory Visit: Admitting: Orthopaedic Surgery

## 2024-09-12 ENCOUNTER — Encounter: Payer: Self-pay | Admitting: Nurse Practitioner

## 2024-09-20 ENCOUNTER — Ambulatory Visit: Admitting: Nurse Practitioner

## 2024-11-09 NOTE — Addendum Note (Signed)
 Addended by: CLAUDENE NEVINS A on: 11/09/2024 02:12 PM   Modules accepted: Orders

## 2024-11-28 ENCOUNTER — Ambulatory Visit: Admitting: Orthopaedic Surgery

## 2024-11-30 ENCOUNTER — Encounter: Admitting: Primary Care

## 2025-02-14 ENCOUNTER — Other Ambulatory Visit

## 2025-02-27 ENCOUNTER — Ambulatory Visit: Admitting: Emergency Medicine

## 2025-06-27 ENCOUNTER — Ambulatory Visit
